# Patient Record
Sex: Female | Born: 1935 | Race: Black or African American | Hispanic: No | Marital: Married | Smoking: Never smoker
Health system: Southern US, Community
[De-identification: ages and names within clinical notes are randomized; demographics above are authoritative.]

## PROBLEM LIST (undated history)

## (undated) DIAGNOSIS — E079 Disorder of thyroid, unspecified: Secondary | ICD-10-CM

## (undated) DIAGNOSIS — K219 Gastro-esophageal reflux disease without esophagitis: Secondary | ICD-10-CM

## (undated) DIAGNOSIS — M1990 Unspecified osteoarthritis, unspecified site: Secondary | ICD-10-CM

## (undated) DIAGNOSIS — D649 Anemia, unspecified: Secondary | ICD-10-CM

## (undated) DIAGNOSIS — H409 Unspecified glaucoma: Secondary | ICD-10-CM

## (undated) DIAGNOSIS — E1140 Type 2 diabetes mellitus with diabetic neuropathy, unspecified: Secondary | ICD-10-CM

## (undated) DIAGNOSIS — I10 Essential (primary) hypertension: Secondary | ICD-10-CM

## (undated) DIAGNOSIS — C50212 Malignant neoplasm of upper-inner quadrant of left female breast: Secondary | ICD-10-CM

## (undated) HISTORY — PX: COLONOSCOPY: SHX174

## (undated) HISTORY — DX: Disorder of thyroid, unspecified: E07.9

## (undated) HISTORY — DX: Malignant neoplasm of upper-inner quadrant of left female breast: C50.212

## (undated) HISTORY — PX: ABDOMINAL HYSTERECTOMY: SHX81

## (undated) HISTORY — PX: THYROIDECTOMY: SHX17

## (undated) HISTORY — DX: Anemia, unspecified: D64.9

## (undated) HISTORY — PX: BREAST LUMPECTOMY: SHX2

## (undated) HISTORY — PX: EYE SURGERY: SHX253

---

## 1999-10-14 ENCOUNTER — Encounter: Payer: Self-pay | Admitting: Family Medicine

## 1999-10-14 ENCOUNTER — Encounter: Admission: RE | Admit: 1999-10-14 | Discharge: 1999-10-14 | Payer: Self-pay | Admitting: Family Medicine

## 2000-10-26 ENCOUNTER — Encounter: Admission: RE | Admit: 2000-10-26 | Discharge: 2000-10-26 | Payer: Self-pay | Admitting: Family Medicine

## 2000-10-26 ENCOUNTER — Encounter: Payer: Self-pay | Admitting: Family Medicine

## 2000-11-16 ENCOUNTER — Encounter (INDEPENDENT_AMBULATORY_CARE_PROVIDER_SITE_OTHER): Payer: Self-pay | Admitting: Specialist

## 2000-11-16 ENCOUNTER — Ambulatory Visit (HOSPITAL_COMMUNITY): Admission: RE | Admit: 2000-11-16 | Discharge: 2000-11-16 | Payer: Self-pay | Admitting: *Deleted

## 2001-11-01 ENCOUNTER — Encounter: Admission: RE | Admit: 2001-11-01 | Discharge: 2001-11-01 | Payer: Self-pay | Admitting: Family Medicine

## 2001-11-01 ENCOUNTER — Encounter: Payer: Self-pay | Admitting: Family Medicine

## 2002-11-13 ENCOUNTER — Encounter: Payer: Self-pay | Admitting: Family Medicine

## 2002-11-13 ENCOUNTER — Encounter: Admission: RE | Admit: 2002-11-13 | Discharge: 2002-11-13 | Payer: Self-pay | Admitting: Family Medicine

## 2002-11-20 ENCOUNTER — Encounter: Admission: RE | Admit: 2002-11-20 | Discharge: 2002-11-20 | Payer: Self-pay | Admitting: Family Medicine

## 2002-11-20 ENCOUNTER — Encounter: Payer: Self-pay | Admitting: Family Medicine

## 2003-12-06 ENCOUNTER — Encounter: Admission: RE | Admit: 2003-12-06 | Discharge: 2003-12-06 | Payer: Self-pay | Admitting: Family Medicine

## 2004-09-03 ENCOUNTER — Encounter: Admission: RE | Admit: 2004-09-03 | Discharge: 2004-09-03 | Payer: Self-pay | Admitting: Family Medicine

## 2004-12-15 ENCOUNTER — Encounter: Admission: RE | Admit: 2004-12-15 | Discharge: 2004-12-15 | Payer: Self-pay | Admitting: Family Medicine

## 2005-07-20 ENCOUNTER — Encounter: Admission: RE | Admit: 2005-07-20 | Discharge: 2005-07-20 | Payer: Self-pay | Admitting: Family Medicine

## 2005-12-02 ENCOUNTER — Emergency Department (HOSPITAL_COMMUNITY): Admission: EM | Admit: 2005-12-02 | Discharge: 2005-12-02 | Payer: Self-pay | Admitting: Emergency Medicine

## 2006-01-18 ENCOUNTER — Encounter: Admission: RE | Admit: 2006-01-18 | Discharge: 2006-01-18 | Payer: Self-pay | Admitting: Family Medicine

## 2007-01-24 ENCOUNTER — Encounter: Admission: RE | Admit: 2007-01-24 | Discharge: 2007-01-24 | Payer: Self-pay | Admitting: Family Medicine

## 2008-02-29 ENCOUNTER — Encounter: Admission: RE | Admit: 2008-02-29 | Discharge: 2008-02-29 | Payer: Self-pay | Admitting: Family Medicine

## 2008-12-16 ENCOUNTER — Encounter: Admission: RE | Admit: 2008-12-16 | Discharge: 2008-12-16 | Payer: Self-pay | Admitting: Family Medicine

## 2009-04-08 ENCOUNTER — Encounter: Admission: RE | Admit: 2009-04-08 | Discharge: 2009-04-08 | Payer: Self-pay | Admitting: Family Medicine

## 2010-04-14 ENCOUNTER — Encounter: Admission: RE | Admit: 2010-04-14 | Discharge: 2010-04-14 | Payer: Self-pay | Admitting: Family Medicine

## 2010-08-27 ENCOUNTER — Emergency Department (HOSPITAL_BASED_OUTPATIENT_CLINIC_OR_DEPARTMENT_OTHER): Admission: EM | Admit: 2010-08-27 | Discharge: 2010-08-27 | Payer: Self-pay | Admitting: Emergency Medicine

## 2010-08-27 ENCOUNTER — Ambulatory Visit: Payer: Self-pay | Admitting: Radiology

## 2011-05-05 ENCOUNTER — Other Ambulatory Visit: Payer: Self-pay | Admitting: Family Medicine

## 2011-05-05 DIAGNOSIS — Z1231 Encounter for screening mammogram for malignant neoplasm of breast: Secondary | ICD-10-CM

## 2011-05-25 ENCOUNTER — Ambulatory Visit
Admission: RE | Admit: 2011-05-25 | Discharge: 2011-05-25 | Disposition: A | Discharge status: Home / Self Care | Payer: Medicare Other | Source: Ambulatory Visit | Attending: Family Medicine | Admitting: Family Medicine

## 2011-05-25 DIAGNOSIS — Z1231 Encounter for screening mammogram for malignant neoplasm of breast: Secondary | ICD-10-CM

## 2012-05-10 ENCOUNTER — Other Ambulatory Visit: Payer: Self-pay | Admitting: Family Medicine

## 2012-05-10 DIAGNOSIS — Z1231 Encounter for screening mammogram for malignant neoplasm of breast: Secondary | ICD-10-CM

## 2012-05-26 ENCOUNTER — Ambulatory Visit
Admission: RE | Admit: 2012-05-26 | Discharge: 2012-05-26 | Disposition: A | Discharge status: Home / Self Care | Payer: Medicare Other | Source: Ambulatory Visit | Attending: Family Medicine | Admitting: Family Medicine

## 2012-05-26 DIAGNOSIS — Z1231 Encounter for screening mammogram for malignant neoplasm of breast: Secondary | ICD-10-CM

## 2012-06-26 ENCOUNTER — Encounter (HOSPITAL_BASED_OUTPATIENT_CLINIC_OR_DEPARTMENT_OTHER): Payer: Self-pay | Admitting: *Deleted

## 2012-06-26 ENCOUNTER — Emergency Department (HOSPITAL_BASED_OUTPATIENT_CLINIC_OR_DEPARTMENT_OTHER)
Admission: EM | Admit: 2012-06-26 | Discharge: 2012-06-26 | Disposition: A | Discharge status: Home / Self Care | Payer: Medicare HMO | Attending: Emergency Medicine | Admitting: Emergency Medicine

## 2012-06-26 ENCOUNTER — Emergency Department (HOSPITAL_BASED_OUTPATIENT_CLINIC_OR_DEPARTMENT_OTHER): Payer: Medicare HMO

## 2012-06-26 DIAGNOSIS — R079 Chest pain, unspecified: Secondary | ICD-10-CM

## 2012-06-26 DIAGNOSIS — E119 Type 2 diabetes mellitus without complications: Secondary | ICD-10-CM | POA: Insufficient documentation

## 2012-06-26 DIAGNOSIS — I10 Essential (primary) hypertension: Secondary | ICD-10-CM | POA: Insufficient documentation

## 2012-06-26 HISTORY — DX: Essential (primary) hypertension: I10

## 2012-06-26 LAB — COMPREHENSIVE METABOLIC PANEL
ALT: 13 U/L (ref 0–35)
AST: 22 U/L (ref 0–37)
Albumin: 3.8 g/dL (ref 3.5–5.2)
Alkaline Phosphatase: 83 U/L (ref 39–117)
BUN: 23 mg/dL (ref 6–23)
CO2: 29 mEq/L (ref 19–32)
Calcium: 10 mg/dL (ref 8.4–10.5)
Chloride: 97 mEq/L (ref 96–112)
GFR calc Af Amer: 60 mL/min — ABNORMAL LOW (ref >90)
GFR calc non Af Amer: 52 mL/min — ABNORMAL LOW (ref >90)
Glucose, Bld: 165 mg/dL — ABNORMAL HIGH (ref 70–99)
Potassium: 4.9 mEq/L (ref 3.5–5.1)
Sodium: 136 mEq/L (ref 135–145)
Total Protein: 7.6 g/dL (ref 6.0–8.3)

## 2012-06-26 LAB — CBC WITH DIFFERENTIAL/PLATELET
Basophils Absolute: 0.1 10*3/uL (ref 0.0–0.1)
Eosinophils Absolute: 0.1 10*3/uL (ref 0.0–0.7)
HCT: 34.5 % — ABNORMAL LOW (ref 36.0–46.0)
Hemoglobin: 11.3 g/dL — ABNORMAL LOW (ref 12.0–15.0)
Lymphocytes Relative: 31 % (ref 12–46)
Lymphs Abs: 1.7 10*3/uL (ref 0.7–4.0)
MCH: 29.1 pg (ref 26.0–34.0)
MCHC: 32.8 g/dL (ref 30.0–36.0)
MCV: 88.9 fL (ref 78.0–100.0)
Monocytes Absolute: 0.4 10*3/uL (ref 0.1–1.0)
Neutro Abs: 3.2 10*3/uL (ref 1.7–7.7)
Neutrophils Relative %: 59 % (ref 43–77)
Platelets: 279 10*3/uL (ref 150–400)
RBC: 3.88 MIL/uL (ref 3.87–5.11)
RDW: 12.9 % (ref 11.5–15.5)
WBC: 5.5 10*3/uL (ref 4.0–10.5)

## 2012-06-26 LAB — LIPASE, BLOOD: Lipase: 63 U/L — ABNORMAL HIGH (ref 11–59)

## 2012-06-26 MED ORDER — ASPIRIN 81 MG PO CHEW
324.0000 mg | CHEWABLE_TABLET | Freq: Once | ORAL | Status: AC
  Administered 2012-06-26: 324 mg via ORAL
  Filled 2012-06-26: qty 4

## 2012-06-26 MED ORDER — GI COCKTAIL ~~LOC~~
30.0000 mL | Freq: Once | ORAL | Status: AC
  Administered 2012-06-26: 30 mL via ORAL
  Filled 2012-06-26: qty 30

## 2012-06-26 NOTE — ED Notes (Signed)
Returned from xray

## 2012-06-26 NOTE — ED Notes (Signed)
MD at bedside. 

## 2012-06-26 NOTE — ED Notes (Addendum)
Transported to xray 

## 2012-06-26 NOTE — ED Notes (Signed)
Pt. States that she had a snack last pm around 2100. States she started having a burning type pain that lasted a half hour. States she was having "burping" Also states "funny sensation" in her ant chest/neck area. Denies any sob or vomiting. Did have a brief episode of nausea. Denies fevers. resp even and unlabored.

## 2012-06-26 NOTE — ED Provider Notes (Signed)
History     CSN: [id number]  Arrival date & time 06/26/12  0121   First MD Initiated Contact with Patient 06/26/12 0202      Chief Complaint  Patient presents with  . Chest Pain    (Consider location/radiation/quality/duration/timing/severity/associated sxs/prior treatment) Patient is a 78 y.o. female presenting with chest pain. The history is provided by the patient.  Chest Pain   She states that she had a snack at about 2130, and developed a burning pain in her chest following that. Pain was moderately severe and she rated it an 8/10. Pain was present for about 20 minutes before resolving. She did not take any medication, but did notice that it felt better when she was walking around and when she was sitting still. There is no associated dyspnea, nausea, diaphoresis. She fell asleep and then was awakened at about 00 30 by a sharp chest pain which will last only a few minutes before resolving. Since then, she describes a very vague sensation in her neck and throat which he is completely unable to describe. She had taken a dose of Pepto-Bismol with no relief. She's never had symptoms like this before. She is treated for diabetes and hypertension but is a nonsmoker.  Past Medical History  Diagnosis Date  . Hypertension   . Diabetes mellitus     Past Surgical History  Procedure Date  . Abdominal hysterectomy   . Thyroidectomy     No family history on file.  History  Substance Use Topics  . Smoking status: Never Smoker   . Smokeless tobacco: Not on file  . Alcohol Use: No    OB History    Grav Para Term Preterm Abortions TAB SAB Ect Mult Living                  Review of Systems  Cardiovascular: Positive for chest pain.  All other systems reviewed and are negative.    Allergies  Review of patient's allergies indicates no known allergies.  Home Medications   Current Outpatient Rx  Name Route Sig Dispense Refill  . AMLODIPINE BESYLATE 2.5 MG PO TABS Oral Take  2.5 mg by mouth daily.    Marland Kitchen BRIMONIDINE TARTRATE 0.15 % OP SOLN Both Eyes Place 1 drop into both eyes 2 (two) times daily.    . GLYBURIDE 5 MG PO TABS Oral Take 5 mg by mouth daily with breakfast.    . LEVOTHYROXINE SODIUM 75 MCG PO TABS Oral Take 75 mcg by mouth daily.    Marland Kitchen METFORMIN HCL 500 MG PO TABS Oral Take 1,000 mg by mouth 2 (two) times daily with a meal.      BP 148/70  Pulse 92  Temp 98.6 F (37 C) (Oral)  Resp 15  SpO2 96%  Physical Exam  Nursing note and vitals reviewed. 78year old female, resting comfortably and in no acute distress. Vital signs are significant for mild hypertension with blood pressure 148/70. Oxygen saturation is 96%, which is normal. Head is normocephalic and atraumatic. PERRLA, EOMI. Oropharynx is clear. Neck is nontender and supple without adenopathy or JVD. Back is nontender and there is no CVA tenderness. Lungs are clear without rales, wheezes, or rhonchi. Chest is nontender. Heart has regular rate and rhythm without murmur. Abdomen is soft, flat, nontender without masses or hepatosplenomegaly and peristalsis is normoactive. Extremities have no cyanosis or edema, full range of motion is present. Skin is warm and dry without rash. Neurologic: Mental status is normal, cranial nerves  are intact, there are no motor or sensory deficits.   ED Course  Procedures (including critical care time)  Results for orders placed during the hospital encounter of 06/26/12  CBC WITH DIFFERENTIAL      Component Value Range   WBC 5.5  4.0 - 10.5 K/uL   RBC 3.88  3.87 - 5.11 MIL/uL   Hemoglobin 11.3 (*) 12.0 - 15.0 g/dL   HCT 96.0 (*) 45.4 - 09.8 %   MCV 88.9  78.0 - 100.0 fL   MCH 29.1  26.0 - 34.0 pg   MCHC 32.8  30.0 - 36.0 g/dL   RDW 11.9  14.7 - 82.9 %   Platelets 279  150 - 400 K/uL   Neutrophils Relative 59  43 - 77 %   Neutro Abs 3.2  1.7 - 7.7 K/uL   Lymphocytes Relative 31  12 - 46 %   Lymphs Abs 1.7  0.7 - 4.0 K/uL   Monocytes Relative 7  3 - 12  %   Monocytes Absolute 0.4  0.1 - 1.0 K/uL   Eosinophils Relative 2  0 - 5 %   Eosinophils Absolute 0.1  0.0 - 0.7 K/uL   Basophils Relative 2 (*) 0 - 1 %   Basophils Absolute 0.1  0.0 - 0.1 K/uL  COMPREHENSIVE METABOLIC PANEL      Component Value Range   Sodium 136  135 - 145 mEq/L   Potassium 4.9  3.5 - 5.1 mEq/L   Chloride 97  96 - 112 mEq/L   CO2 29  19 - 32 mEq/L   Glucose, Bld 165 (*) 70 - 99 mg/dL   BUN 23  6 - 23 mg/dL   Creatinine, Ser 5.62  0.50 - 1.10 mg/dL   Calcium 13.0  8.4 - 86.5 mg/dL   Total Protein 7.6  6.0 - 8.3 g/dL   Albumin 3.8  3.5 - 5.2 g/dL   AST 22  0 - 37 U/L   ALT 13  0 - 35 U/L   Alkaline Phosphatase 83  39 - 117 U/L   Total Bilirubin 0.2 (*) 0.3 - 1.2 mg/dL   GFR calc non Af Amer 52 (*) >90 mL/min   GFR calc Af Amer 60 (*) >90 mL/min  LIPASE, BLOOD      Component Value Range   Lipase 63 (*) 11 - 59 U/L  TROPONIN I      Component Value Range   Troponin I <0.30  <0.30 ng/mL  TROPONIN I      Component Value Range   Troponin I <0.30  <0.30 ng/mL   Dg Chest 2 View  06/26/2012  *RADIOLOGY REPORT*  Clinical Data: Chest pain.  CHEST - 2 VIEW  Comparison: Chest CT 09/03/2004.  Findings:  Cardiopericardial silhouette within normal limits. Mediastinal contours normal. Trachea midline.  No airspace disease or effusion. Aortic arch atherosclerosis.  There is a faint density in the medial right apex overlying the medial aspect of the clavicle.  IMPRESSION: Vague density at the right apex could represent a small focus of bronchopneumonia.  Follow-up to ensure clearing is recommended. Follow-up to ensure radiographic clearing recommended.  Clearing is usually observed at 8 weeks.  If clearing is not observed, follow- up noncontrast chest CT recommended.   Original Report Authenticated By: Andreas Newport, M.D.      Date: 06/26/2012  Rate: 88  Rhythm: normal sinus rhythm  QRS Axis: normal  Intervals: normal  ST/T Wave abnormalities: normal  Conduction  Disutrbances:right  bundle branch block  Narrative Interpretation: Right bundle branch block. When compared with ECG of 12/02/2005, no significant changes are seen.  Old EKG Reviewed: unchanged  1. Chest pain       MDM  Chest discomfort of uncertain cause. She'll be given aspirin and GI cocktail and cardiac markers checked as well as metabolic panel and lipase.  She got complete relief of symptoms following GI cocktail. Initial laboratory workup is significant for borderline elevated lipase of uncertain significance. She'll be kept in the ED 2 check a three-hour delta troponin.  Repeat troponin is undetectable. She'll be discharged with instructions to followup with her PCP.    Dione Booze, MD 06/26/12 [phone]

## 2013-07-17 ENCOUNTER — Other Ambulatory Visit: Payer: Self-pay

## 2013-07-17 DIAGNOSIS — Z1231 Encounter for screening mammogram for malignant neoplasm of breast: Secondary | ICD-10-CM

## 2013-08-07 ENCOUNTER — Ambulatory Visit: Payer: Medicare Other

## 2013-08-28 ENCOUNTER — Ambulatory Visit
Admission: RE | Admit: 2013-08-28 | Discharge: 2013-08-28 | Disposition: A | Discharge status: Home / Self Care | Payer: Medicare HMO | Source: Ambulatory Visit

## 2013-08-28 DIAGNOSIS — Z1231 Encounter for screening mammogram for malignant neoplasm of breast: Secondary | ICD-10-CM

## 2014-09-19 ENCOUNTER — Other Ambulatory Visit: Payer: Self-pay

## 2014-09-19 DIAGNOSIS — Z1231 Encounter for screening mammogram for malignant neoplasm of breast: Secondary | ICD-10-CM

## 2014-10-03 ENCOUNTER — Ambulatory Visit
Admission: RE | Admit: 2014-10-03 | Discharge: 2014-10-03 | Disposition: A | Discharge status: Home / Self Care | Payer: Medicare HMO | Source: Ambulatory Visit

## 2014-10-03 DIAGNOSIS — Z1231 Encounter for screening mammogram for malignant neoplasm of breast: Secondary | ICD-10-CM

## 2014-10-08 ENCOUNTER — Other Ambulatory Visit: Payer: Self-pay | Admitting: Family Medicine

## 2014-10-08 DIAGNOSIS — R928 Other abnormal and inconclusive findings on diagnostic imaging of breast: Secondary | ICD-10-CM

## 2014-10-22 ENCOUNTER — Other Ambulatory Visit: Payer: Self-pay | Admitting: Family Medicine

## 2014-10-22 ENCOUNTER — Ambulatory Visit
Admission: RE | Admit: 2014-10-22 | Discharge: 2014-10-22 | Disposition: A | Discharge status: Home / Self Care | Payer: Medicare HMO | Source: Ambulatory Visit | Attending: Family Medicine | Admitting: Family Medicine

## 2014-10-22 DIAGNOSIS — R928 Other abnormal and inconclusive findings on diagnostic imaging of breast: Secondary | ICD-10-CM

## 2014-11-01 ENCOUNTER — Ambulatory Visit
Admission: RE | Admit: 2014-11-01 | Discharge: 2014-11-01 | Disposition: A | Discharge status: Home / Self Care | Payer: Medicare HMO | Source: Ambulatory Visit | Attending: Family Medicine | Admitting: Family Medicine

## 2014-11-01 DIAGNOSIS — R928 Other abnormal and inconclusive findings on diagnostic imaging of breast: Secondary | ICD-10-CM

## 2014-11-05 ENCOUNTER — Other Ambulatory Visit: Payer: Self-pay | Admitting: Family Medicine

## 2014-11-05 DIAGNOSIS — C50911 Malignant neoplasm of unspecified site of right female breast: Secondary | ICD-10-CM

## 2014-11-06 ENCOUNTER — Telehealth: Payer: Self-pay | Admitting: *Deleted

## 2014-11-06 ENCOUNTER — Encounter: Payer: Self-pay | Admitting: *Deleted

## 2014-11-06 DIAGNOSIS — C50212 Malignant neoplasm of upper-inner quadrant of left female breast: Secondary | ICD-10-CM

## 2014-11-06 DIAGNOSIS — C50211 Malignant neoplasm of upper-inner quadrant of right female breast: Secondary | ICD-10-CM | POA: Insufficient documentation

## 2014-11-06 HISTORY — DX: Malignant neoplasm of upper-inner quadrant of left female breast: C50.212

## 2014-11-06 NOTE — Telephone Encounter (Signed)
Confirmed [facility] for 11/13/14 at 0830.  Instructions and contact information given. Gave information to pt daughter as well.

## 2014-11-11 ENCOUNTER — Other Ambulatory Visit: Payer: Medicare HMO

## 2014-11-13 ENCOUNTER — Ambulatory Visit
Admission: RE | Admit: 2014-11-13 | Discharge: 2014-11-13 | Disposition: A | Discharge status: Home / Self Care | Payer: Medicare HMO | Source: Ambulatory Visit | Attending: Radiation Oncology | Admitting: Radiation Oncology

## 2014-11-13 ENCOUNTER — Ambulatory Visit (HOSPITAL_BASED_OUTPATIENT_CLINIC_OR_DEPARTMENT_OTHER): Payer: Medicare HMO | Admitting: Hematology and Oncology

## 2014-11-13 ENCOUNTER — Other Ambulatory Visit (HOSPITAL_BASED_OUTPATIENT_CLINIC_OR_DEPARTMENT_OTHER): Payer: Medicare HMO

## 2014-11-13 ENCOUNTER — Other Ambulatory Visit: Payer: Self-pay | Admitting: *Deleted

## 2014-11-13 ENCOUNTER — Other Ambulatory Visit (INDEPENDENT_AMBULATORY_CARE_PROVIDER_SITE_OTHER): Payer: Self-pay | Admitting: General Surgery

## 2014-11-13 ENCOUNTER — Encounter: Payer: Self-pay | Admitting: Hematology and Oncology

## 2014-11-13 ENCOUNTER — Ambulatory Visit: Payer: Medicare HMO

## 2014-11-13 ENCOUNTER — Encounter: Payer: Self-pay | Admitting: Skilled Nursing Facility1

## 2014-11-13 ENCOUNTER — Ambulatory Visit: Payer: Medicare HMO | Attending: General Surgery | Admitting: Physical Therapy

## 2014-11-13 ENCOUNTER — Encounter: Payer: Self-pay | Admitting: *Deleted

## 2014-11-13 VITALS — BP 138/54 | HR 78 | Temp 98.1°F | Resp 18 | Ht 62.0 in | Wt 158.5 lb

## 2014-11-13 DIAGNOSIS — C50212 Malignant neoplasm of upper-inner quadrant of left female breast: Secondary | ICD-10-CM

## 2014-11-13 DIAGNOSIS — R293 Abnormal posture: Secondary | ICD-10-CM | POA: Insufficient documentation

## 2014-11-13 DIAGNOSIS — C773 Secondary and unspecified malignant neoplasm of axilla and upper limb lymph nodes: Secondary | ICD-10-CM

## 2014-11-13 DIAGNOSIS — C50311 Malignant neoplasm of lower-inner quadrant of right female breast: Secondary | ICD-10-CM

## 2014-11-13 DIAGNOSIS — Z171 Estrogen receptor negative status [ER-]: Secondary | ICD-10-CM

## 2014-11-13 LAB — CBC WITH DIFFERENTIAL/PLATELET
BASO%: 1.3 % (ref 0.0–2.0)
Basophils Absolute: 0.1 10*3/uL (ref 0.0–0.1)
EOS ABS: 0.1 10*3/uL (ref 0.0–0.5)
EOS%: 1.7 % (ref 0.0–7.0)
HEMATOCRIT: 35 % (ref 34.8–46.6)
HGB: 11 g/dL — ABNORMAL LOW (ref 11.6–15.9)
LYMPH#: 1.8 10*3/uL (ref 0.9–3.3)
LYMPH%: 39.6 % (ref 14.0–49.7)
MCH: 28.4 pg (ref 25.1–34.0)
MCHC: 31.6 g/dL (ref 31.5–36.0)
MCV: 90 fL (ref 79.5–101.0)
MONO#: 0.5 10*3/uL (ref 0.1–0.9)
MONO%: 10.4 % (ref 0.0–14.0)
NEUT#: 2.2 10*3/uL (ref 1.5–6.5)
NEUT%: 47 % (ref 38.4–76.8)
Platelets: 299 10*3/uL (ref 145–400)
RBC: 3.89 10*6/uL (ref 3.70–5.45)
RDW: 13.7 % (ref 11.2–14.5)
WBC: 4.6 10*3/uL (ref 3.9–10.3)

## 2014-11-13 LAB — COMPREHENSIVE METABOLIC PANEL (CC13)
ALBUMIN: 3.7 g/dL (ref 3.5–5.0)
ALT: 13 U/L (ref 0–55)
ANION GAP: 8 meq/L (ref 3–11)
AST: 20 U/L (ref 5–34)
Alkaline Phosphatase: 88 U/L (ref 40–150)
BILIRUBIN TOTAL: 0.29 mg/dL (ref 0.20–1.20)
BUN: 17.8 mg/dL (ref 7.0–26.0)
CO2: 28 meq/L (ref 22–29)
Calcium: 9.8 mg/dL (ref 8.4–10.4)
Chloride: 104 mEq/L (ref 98–109)
Creatinine: 1.2 mg/dL — ABNORMAL HIGH (ref 0.6–1.1)
EGFR: 50 mL/min/{1.73_m2} — ABNORMAL LOW (ref >90)
GLUCOSE: 193 mg/dL — AB (ref 70–140)
POTASSIUM: 5.4 meq/L — AB (ref 3.5–5.1)
SODIUM: 140 meq/L (ref 136–145)
TOTAL PROTEIN: 7.5 g/dL (ref 6.4–8.3)

## 2014-11-13 MED ORDER — ONDANSETRON HCL 8 MG PO TABS: 8.0000 mg | ORAL_TABLET | Freq: Two times a day (BID) | ORAL | Status: DC

## 2014-11-13 MED ORDER — PROCHLORPERAZINE MALEATE 10 MG PO TABS: 10.0000 mg | ORAL_TABLET | Freq: Four times a day (QID) | ORAL | Status: DC | PRN

## 2014-11-13 MED ORDER — LORAZEPAM 0.5 MG PO TABS: 0.5000 mg | ORAL_TABLET | Freq: Four times a day (QID) | ORAL | Status: DC | PRN

## 2014-11-13 NOTE — Progress Notes (Signed)
Subjective:     Patient ID: Brianna Valdez, female   DOB: Jul 01, 1931, 81 y.o.   MRN: [id number]  HPI   Review of Systems     Objective:   Physical Exam    For the patient to understand and be given the tools to implement a healthy plant based diet during their cancer diagnosis.  Assessment:     Patient was seen today and found to be in good spirits and was accompanied by two daughters and her husband. Patient has a palpable right breast mass. Patient complains of a decreased appetite since about a year ago. Patient states she does not wake up until about 11am and does not get a restful sleep. Patients current medications are synthroid (thyroidectomy many years ago), lisinopril, and a multivitamin. She has diabetes but she states it is well managed. Patient states she will try to increase her physical activity level by getting on the stationary bike for thirty minutes a day (as suggested by the PT). Patients weight taken 11/13/14 is 158 at a height of 5'2'' and a BMI of 29.1 Patients labs: Potassium high at 5.4, Creatinine high at 1.2, GFR low at 50 (no kidney failure diagnosis found in E-MR), and HGB is low at 11.    Plan:     Dietitian educated the patient on implementing a plant based diet by incorporating more plant proteins, fruits, and vegetables. As a part of a healthy routine physical activity was discussed.  Dietitian encouraged the patient to increase her caloric intake and assisted the patient with some ideas on how to do that, i.e. A smoothie in the morning and 2-3 snacks throughout the day.    The importance of legitimate, evidence based nutrition information was discussed and examples were given.  A folder of evidenced based information with a focus on a plant based diet and nutrition during cancer was given to the patient.  As a part of the continuum of care the cancer dietitians contact information was given  in the event the patient would like to have a follow up  appointment.

## 2014-11-13 NOTE — Progress Notes (Signed)
[facility] NOTE  Patient Care Team: Hulan Fess, MD as PCP - General Excell Seltzer, MD as Consulting Physician (General Surgery) Rulon Eisenmenger, MD as Consulting Physician (Hematology and Oncology) Thea Silversmith, MD as Consulting Physician (Radiation Oncology) Roselee Culver, RN as Registered Nurse [facility] Caleen Jobs, RN as Registered Nurse  CHIEF COMPLAINTS/PURPOSE OF CONSULTATION:  Newly diagnosed breast cancer  HISTORY OF PRESENTING ILLNESS:  Brianna Valdez 81 y.o. female is here because of recent diagnosis of right-sided breast cancer. She had a screening mammogram that detected a right breast mass which was ill-defined, this led to an ultrasound that revealed 2.6 and a mass in the right breast in addition there were multiple right axillary lymph nodes the largest of which was 5 mm with cortical thickening. She underwent a biopsy of the mass as well as lymph node which came back as grade 3 invasive ductal carcinoma that was triple negative with a Ki-67 of 43% in the lymph node and 39% in the breast tumor. She is going to have an MRI of the breast on 11/15/2014. She was presented this morning at the multidisciplinary tumor board and she is here today to discuss a treatment plan in the [facility] clinic. She is accompanied by her family and her husband today. She denies any pain in the breast but now has a palpable mass in the right breast.  I reviewed her records extensively and collaborated the history with the patient.  SUMMARY OF ONCOLOGIC HISTORY:   Breast cancer of upper-inner quadrant of left female breast   10/22/2014 Mammogram Highly suspicious 2.6 cm mass in the upper inner quadrant of the right breast at the 2 o'clock , Pathologic right axillary lymph nodes, including a node in the mid axillary line with eccentric cortical thickening up to 5 mm   11/01/2014 Initial Biopsy Right breast biopsy 2:00: IDC with DCIS, ER/PR 0%, Ki-67 39%, HER-2  negative; right axillary lymph node biopsy: Positive for IDC ER/PR 0%, 43%, HER-2 Negative Ratio 0.94    In terms of breast cancer risk profile:  Menarche at menopause unsure She had 4 pregnancy, her first child was born at age 22  She has not received birth control pills.  She was never exposed to fertility medications or hormone replacement therapy.  She has no family history of Breast/GYN/GI cancer  MEDICAL HISTORY:  Past Medical History  Diagnosis Date  . Hypertension   . Diabetes mellitus   . Breast cancer of upper-inner quadrant of left female breast 11/06/2014  . Anemia   . Thyroid disease     SURGICAL HISTORY: Past Surgical History  Procedure Laterality Date  . Abdominal hysterectomy    . Thyroidectomy      SOCIAL HISTORY: History   Social History  . Marital Status: Married    Spouse Name: N/A  . Number of Children: N/A  . Years of Education: N/A   Occupational History  . Not on file.   Social History Main Topics  . Smoking status: Never Smoker   . Smokeless tobacco: Not on file  . Alcohol Use: No  . Drug Use: No  . Sexual Activity: Not on file   Other Topics Concern  . Not on file   Social History Narrative    FAMILY HISTORY: History reviewed. No pertinent family history.  ALLERGIES:  has No Known Allergies.  MEDICATIONS:  Current Outpatient Prescriptions  Medication Sig Dispense Refill  . amLODipine (NORVASC) 2.5 MG tablet Take 2.5 mg by mouth  daily.    . levothyroxine (SYNTHROID, LEVOTHROID) 75 MCG tablet Take 75 mcg by mouth daily.    Marland Kitchen lisinopril (PRINIVIL,ZESTRIL) 10 MG tablet Take 10 mg by mouth daily.    . metFORMIN (GLUCOPHAGE) 500 MG tablet Take 1,000 mg by mouth 2 (two) times daily with a meal.    . Multiple Vitamin (MULTIVITAMIN) tablet Take 1 tablet by mouth daily.    . brimonidine (ALPHAGAN) 0.15 % ophthalmic solution Place 1 drop into both eyes 2 (two) times daily.    Marland Kitchen glimepiride (AMARYL) 1 MG tablet   6  . glyBURIDE (DIABETA)  5 MG tablet Take 5 mg by mouth daily with breakfast.     No current facility-administered medications for this visit.    REVIEW OF SYSTEMS:   Constitutional: Denies fevers, chills or abnormal night sweats Eyes: Denies blurriness of vision, double vision or watery eyes Ears, nose, mouth, throat, and face: Denies mucositis or sore throat Respiratory: Denies cough, dyspnea or wheezes Cardiovascular: Denies palpitation, chest discomfort or lower extremity swelling Gastrointestinal:  Denies nausea, heartburn or change in bowel habits Skin: Denies abnormal skin rashes Lymphatics: Denies new lymphadenopathy or easy bruising Neurological:Denies numbness, tingling or new weaknesses Behavioral/Psych: Mood is stable, no new changes  Breast: Palpable mass in the right breast All other systems were reviewed with the patient and are negative.  PHYSICAL EXAMINATION: ECOG PERFORMANCE STATUS: 0 - Asymptomatic  Filed Vitals:   11/13/14 0857  BP: 138/54  Pulse: 78  Temp: 98.1 F (36.7 C)  Resp: 18   Filed Weights   11/13/14 0857  Weight: 158 lb 8 oz (71.895 kg)    GENERAL:alert, no distress and comfortable SKIN: skin color, texture, turgor are normal, no rashes or significant lesions EYES: normal, conjunctiva are pink and non-injected, sclera clear OROPHARYNX:no exudate, no erythema and lips, buccal mucosa, and tongue normal  NECK: supple, thyroid normal size, non-tender, without nodularity LYMPH:  no palpable lymphadenopathy in the cervical, axillary or inguinal LUNGS: clear to auscultation and percussion with normal breathing effort HEART: regular rate & rhythm and no murmurs and no lower extremity edema ABDOMEN:abdomen soft, non-tender and normal bowel sounds Musculoskeletal:no cyanosis of digits and no clubbing  PSYCH: alert & oriented x 3 with fluent speech NEURO: no focal motor/sensory deficits BREAST: Large palpable mass in the right breast 5 centimeters to physical examination.  Small palpable axillary lymph nodes (exam performed in the presence of a chaperone)   LABORATORY DATA:  I have reviewed the data as listed Lab Results  Component Value Date   WBC 4.6 11/13/2014   HGB 11.0* 11/13/2014   HCT 35.0 11/13/2014   MCV 90.0 11/13/2014   PLT 299 11/13/2014   Lab Results  Component Value Date   NA 140 11/13/2014   K 5.4* 11/13/2014   CL 97 06/26/2012   CO2 28 11/13/2014    RADIOGRAPHIC STUDIES: I have personally reviewed the radiological reports and agreed with the findings in the report. Results are summarized as above  ASSESSMENT AND PLAN:  Breast cancer of upper-inner quadrant of left female breast 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 ER 0%, PR 0%, HER-2 negative Ki-67 39%, 2.6 cm mass Right axillary lymph node biopsy positive for invasive ductal carcinoma ER 0% PR 0% HER-2 negative Ki-67 43% Clinical stage: T2 N1 M0 stage IIB  Pathology and radiology counseling:Discussed with the patient, the details of pathology including the type of breast cancer,the clinical staging, the significance of ER, PR and HER-2/neu receptors and  the implications for treatment. After reviewing the pathology in detail, we proceeded to discuss the different treatment options between surgery, radiation, chemotherapy, antiestrogen therapies.  Recommendation based on multidisciplinary tumor board: 1. Neoadjuvant chemotherapy with weekly Taxol and carboplatin 12 2. PET CT scan for staging purposes because she has multiple positive axillary lymph nodes 3. Following chemotherapy, surgery with consideration for ALLIANCE clinical trial. 4. Following surgery adjuvant radiation may be considered 5. Chemotherapy class  Chemotherapy counseling: I discussed risks and benefits of chemotherapy including the risk of hair loss which the patient has a week already, neuropathy, nausea vomiting, loss of taste, fatigue, decrease in blood counts, risk of infection and patient  understands this and is agreeable to proceed with treatment.  Plan to start chemotherapy in 2 weeks after port has been placed.   All questions were answered. The patient knows to call the clinic with any problems, questions or concerns.    Rulon Eisenmenger, MD 11:38 AM

## 2014-11-13 NOTE — Assessment & Plan Note (Addendum)
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 ER 0%, PR 0%, HER-2 negative Ki-67 39%, 2.6 cm mass Right axillary lymph node biopsy positive for invasive ductal carcinoma ER 0% PR 0% HER-2 negative Ki-67 43% Clinical stage: T2 N1 M0 stage IIB  Pathology and radiology counseling:Discussed with the patient, the details of pathology including the type of breast cancer,the clinical staging, the significance of ER, PR and HER-2/neu receptors and the implications for treatment. After reviewing the pathology in detail, we proceeded to discuss the different treatment options between surgery, radiation, chemotherapy, antiestrogen therapies.  Recommendation based on multidisciplinary tumor board: 1. Neoadjuvant chemotherapy with weekly Taxol and carboplatin 12 2. PET CT scan for staging purposes because she has multiple positive axillary lymph nodes 3. Following chemotherapy, surgery with consideration for ALLIANCE clinical trial. 4. Following surgery adjuvant radiation may be considered 5. Chemotherapy class  Chemotherapy counseling: I discussed risks and benefits of chemotherapy including the risk of hair loss which the patient has a week already, neuropathy, nausea vomiting, loss of taste, fatigue, decrease in blood counts, risk of infection and patient understands this and is agreeable to proceed with treatment.  Plan to start chemotherapy in 2 weeks after port has been placed.

## 2014-11-13 NOTE — Progress Notes (Signed)
  Radiation Oncology         [phone]) [phone] ________________________________  Initial outpatient Consultation - Date: 11/13/2014   Name: Brianna Valdez MRN: [id number]   DOB: 01/14/1931  REFERRING PHYSICIAN: Excell Seltzer, MD  DIAGNOSIS: T2N1 triple negative right breast cancer  HISTORY OF PRESENT ILLNESS::Brianna Valdez is a 81 y.o. female  Who was found to have a right breast mass on screening mammogram in the lower inner quadrant. This measured 1.1 x 2.6 x 1.9 cm on ultrasound. Enlarged right axillary lymph nodes were seen and biopsied. The biopsy of the lymph nodes was positive for malignancy and the breast mass biopsy showed a Grade 3 Invasive Ductal Carcinoma which was triple negative with a Ki 67 of 40%. MRI is scheduled for 2/12. She is GXP4 with her first birth at 71. She is post menopausal. She is unsure of when her menses started.   PREVIOUS RADIATION THERAPY: No  Past medical, social and family history were reviewed in the electronic chart. Review of symptoms was reviewed in the electronic chart. Medications were reviewed in the electronic chart.   PHYSICAL EXAM: There were no vitals filed for this visit.. . She is pleasant and in no distress. She has a palpable mass in the upper inner quadrant of the right breast. There is bruising lateral to this. She has no palpable adenopathy. She has no palpable abnormalities of the left breast. She is alert and oriented with intact cranial nerves 2-12.   IMPRESSION: T2N1 Invasive Ductal Carcinoma of the right breast.   PLAN: I spoke to the patient today regarding her diagnosis and options for treatment. We discussed the equivalence in terms of survival and local failure between mastectomy and breast conservation. We discussed the role of radiation in decreasing local failures in patients who undergo mastectomy and have positive lymph nodes or tumors greater than 5 cm. We discussed the role of radiation and decreasing local  failures in patients who undergo lumpectomy.We discussed the possible side effects including but not limited to asymptomatic rib, heart and lung damage, heart disease, skin redness, fatigue, permanent skin darkening, and chest wall swelling. We discussed increased complications that can occur with reconstruction after radiation. We discussed the process of simulation and the placement tattoos. We discussed the low likelihood of secondary malignancies.    She will undergo her MRI on 2/12, staging and likely neoadjuvant chemotherapy. I will see her back when her surgery is complete.    I spent 40 minutes  face to face with the patient and more than 50% of that time was spent in counseling and/or coordination of care.   ------------------------------------------------  Thea Silversmith, MD

## 2014-11-13 NOTE — Progress Notes (Signed)
Checked in new pt with no financial concerns prior to seeing the dr.  Informed pt if chemo is part of her treatment I will obtain auth for chemo if needed as well as contact foundations that offer copay assistance for chemo if needed.  She has my card for any billing questions or concerns.

## 2014-11-13 NOTE — Therapy (Signed)
[place], [place], [address] Phone: [phone]   Fax:  [phone]  Physical Therapy Evaluation  Patient Details  Name: Brianna Valdez MRN: [id number] Date of Birth: Aug 05, 1931 Referring Provider:  Excell Seltzer, MD  Encounter Date: 11/13/2014      PT End of Session - 11/13/14 1145    Visit Number 1   Number of Visits 1   PT Start Time 1022   PT Stop Time 1048   PT Time Calculation (min) 26 min   Activity Tolerance Patient tolerated treatment well   Behavior During Therapy [facility] for tasks assessed/performed      Past Medical History  Diagnosis Date  . Hypertension   . Diabetes mellitus   . Breast cancer of upper-inner quadrant of left female breast 11/06/2014  . Anemia   . Thyroid disease     Past Surgical History  Procedure Laterality Date  . Abdominal hysterectomy    . Thyroidectomy      There were no vitals taken for this visit.  Visit Diagnosis:  Carcinoma of lower-inner quadrant of right breast - Plan: PT plan of care cert/re-cert  Abnormal posture - Plan: PT plan of care cert/re-cert      Subjective Assessment - 11/13/14 1133    Symptoms Patient is being seen today for a baseline assessment of her newly diagnosed right breast cancer.   Pertinent History She was diagnosed on 11/04/14 with right triple negative, node positive 2.6 cm breast cancer.    Patient Stated Goals Reduce lymphedema risk and learn post op shoulder ROM HEP.   Currently in Pain? No/denies          [facility] PT Assessment - 11/13/14 0001    Assessment   Medical Diagnosis Right breast cancer   Onset Date 11/04/14   Precautions   Precautions Other (comment)  Active breast cancer   Balance Screen   Has the patient fallen in the past 6 months No   Has the patient had a decrease in activity level because of a fear of falling?  No   Is the patient reluctant to leave their home because of a fear of  falling?  No   Home Environment   Living Enviornment Private residence   Living Arrangements Spouse/significant other   Available Help at Discharge Family   Prior Function   Level of [place] with basic ADLs   Vocation Retired  from hospital administration   Leisure She does not exercise   Cognition   Overall Cognitive Status Within Functional Limits for tasks assessed   Posture/Postural Control   Posture/Postural Control Postural limitations   Postural Limitations Rounded Shoulders;Forward head   AROM   Right Shoulder Extension 60 Degrees   Right Shoulder Flexion 142 Degrees   Right Shoulder ABduction 172 Degrees   Right Shoulder Internal Rotation 50 Degrees   Right Shoulder External Rotation 80 Degrees   Left Shoulder Extension 60 Degrees   Left Shoulder Flexion 125 Degrees   Left Shoulder ABduction 150 Degrees   Left Shoulder Internal Rotation 64 Degrees   Left Shoulder External Rotation 80 Degrees   Strength   Overall Strength Within functional limits for tasks performed           LYMPHEDEMA/ONCOLOGY QUESTIONNAIRE - 11/13/14 1143    Type   Cancer Type right breast   Lymphedema Assessments   Lymphedema Assessments Upper extremities   Right Upper Extremity Lymphedema   10 cm Proximal to Olecranon Process 27.9 cm  Olecranon Process 23.5 cm   10 cm Proximal to Ulnar Styloid Process 21.5 cm   Just Proximal to Ulnar Styloid Process 15.3 cm   Across Hand at [employer] 19.2 cm   At [place] of 2nd Digit 6.8 cm   Left Upper Extremity Lymphedema   10 cm Proximal to Olecranon Process 30 cm   Olecranon Process 24.2 cm   10 cm Proximal to Ulnar Styloid Process 20.7 cm   Just Proximal to Ulnar Styloid Process 15.2 cm   Across Hand at [employer] 18.5 cm   At [place] of 2nd Digit 6.3 cm       Patient was instructed today in a home exercise program today for post op shoulder range of motion. These included active assist shoulder flexion in sitting,  scapular retraction, wall walking with shoulder abduction, and hands behind head external rotation.  She was encouraged to do these twice a day, holding 3 seconds and repeating 5 times when permitted by her physician.         PT Education - 11/13/14 1145    Education provided Yes   Education Details Post op shoulder ROM HEP and lymphedema risk reduction   Person(s) Educated Patient;Spouse;Child(ren)   Methods Explanation;Demonstration;Handout   Comprehension Verbalized understanding              Breast Clinic Goals - 11/13/14 1148    Patient will be able to verbalize understanding of pertinent lymphedema risk reduction practices relevant to her diagnosis specifically related to skin care.   Time 1   Period Days   Status Achieved   Patient will be able to return demonstrate and/or verbalize understanding of the post-op home exercise program related to regaining shoulder range of motion.   Time 1   Period Days   Status Achieved   Patient will be able to verbalize understanding of the importance of attending the postoperative After Breast Cancer Class for further lymphedema risk reduction education and therapeutic exercise.   Time 1   Period Days   Status Achieved              Plan - 11/13/14 1146    Clinical Impression Statement Patient was diagnosed with right breast cancer on 11/04/14.  She has a known positive right axillary lymph node.  She is planning to undergo neoadjuvant chemotherapy followed by a right lumpectomy or mastectomy with an axillary node dissection followed possibly by radiation.  She will benefit from post op P.T. to regain shoulder ROM and strength.   Pt will benefit from skilled therapeutic intervention in order to improve on the following deficits Decreased range of motion;Increased edema;Decreased knowledge of precautions;Decreased strength;Impaired UE functional use   Rehab Potential Good   Clinical Impairments Affecting Rehab Potential none   PT  Frequency One time visit   PT Treatment/Interventions Patient/family education;Therapeutic exercise   Consulted and Agree with Plan of Care Patient;Family member/caregiver   Family Member Consulted children and husband    Patient will follow up at outpatient cancer rehab if needed following surgery.  If the patient requires physical therapy at that time, a specific plan will be dictated and sent to the referring physician for approval. The patient was educated today on appropriate basic range of motion exercises to begin post operatively and the importance of attending the After Breast Cancer class following surgery.  Patient was educated today on lymphedema risk reduction practices as it pertains to recommendations that will benefit the patient immediately following surgery.  She verbalized good understanding.  No additional physical therapy is indicated at this time.         G-Codes - 24-Nov-2014 1148    Functional Assessment Tool Used Clinical Judgement   Functional Limitation Other PT primary   Other PT Primary Current Status (D6438) At least 1 percent but less than 20 percent impaired, limited or restricted   Other PT Primary Goal Status (V8184) At least 1 percent but less than 20 percent impaired, limited or restricted   Other PT Primary Discharge Status (C3754) At least 1 percent but less than 20 percent impaired, limited or restricted       Problem List Patient Active Problem List   Diagnosis Date Noted  . Breast cancer of upper-inner quadrant of left female breast 11/06/2014    Annia Friendly, PT Nov 24, 2014, 11:50 AM  [place], [place], [address] Phone: [phone]   Fax:  [phone]

## 2014-11-13 NOTE — Patient Instructions (Signed)

## 2014-11-13 NOTE — Progress Notes (Signed)
Clinical Social Work [place] Psychosocial Distress Screening [place]  Patient completed distress screening protocol and scored a 4 on the Psychosocial Distress Thermometer which indicates mild distress. Clinical Social Worker met with patient and family in [facility] to assess for distress and other psychosocial needs.  Patient stated she was doing well and felt comfortable with treatment plans after meeting with the multidisciplinary team.  CSW and patient discussed common feelings and concerns when being diagnosed with cancer.  CSW informed patient and family on the support team and support services at [facility] and encouraged patient to call with any questions or concerns.       ONCBCN DISTRESS SCREENING 11/13/2014  Screening Type Initial Screening  Distress experienced in past week (1-10) 4  Emotional problem type Adjusting to illness  Physical Problem type Sleep/insomnia;Loss of appetitie  Physician notified of physical symptoms Yes  Referral to clinical psychology No  Referral to clinical social work No  Referral to dietition No  Referral to financial advocate No  Referral to support programs No  Referral to palliative care No   Johnnye Lana, MSW, LCSW, OSW-C Clinical Social Worker [place] [phone]

## 2014-11-14 ENCOUNTER — Telehealth: Payer: Self-pay | Admitting: Hematology and Oncology

## 2014-11-14 NOTE — Telephone Encounter (Signed)
, °

## 2014-11-15 ENCOUNTER — Ambulatory Visit
Admission: RE | Admit: 2014-11-15 | Discharge: 2014-11-15 | Disposition: A | Discharge status: Home / Self Care | Payer: Medicare HMO | Source: Ambulatory Visit | Attending: Family Medicine | Admitting: Family Medicine

## 2014-11-15 DIAGNOSIS — C50911 Malignant neoplasm of unspecified site of right female breast: Secondary | ICD-10-CM

## 2014-11-15 MED ORDER — GADOBENATE DIMEGLUMINE 529 MG/ML IV SOLN: 7.0000 mL | Freq: Once | INTRAVENOUS | Status: AC | PRN

## 2014-11-18 ENCOUNTER — Other Ambulatory Visit: Payer: Medicare HMO

## 2014-11-20 ENCOUNTER — Encounter: Payer: Self-pay | Admitting: *Deleted

## 2014-11-20 ENCOUNTER — Other Ambulatory Visit: Payer: Medicare HMO

## 2014-11-22 ENCOUNTER — Other Ambulatory Visit: Payer: Self-pay

## 2014-11-25 ENCOUNTER — Encounter (HOSPITAL_COMMUNITY)
Admission: RE | Admit: 2014-11-25 | Discharge: 2014-11-25 | Disposition: A | Discharge status: Home / Self Care | Payer: Medicare HMO | Source: Ambulatory Visit | Attending: General Surgery | Admitting: General Surgery

## 2014-11-25 ENCOUNTER — Encounter (HOSPITAL_COMMUNITY): Payer: Self-pay

## 2014-11-25 ENCOUNTER — Other Ambulatory Visit (HOSPITAL_COMMUNITY): Payer: Self-pay | Admitting: *Deleted

## 2014-11-25 ENCOUNTER — Other Ambulatory Visit: Payer: Self-pay | Admitting: Oncology

## 2014-11-25 ENCOUNTER — Telehealth: Payer: Self-pay | Admitting: Hematology and Oncology

## 2014-11-25 DIAGNOSIS — K219 Gastro-esophageal reflux disease without esophagitis: Secondary | ICD-10-CM | POA: Diagnosis not present

## 2014-11-25 DIAGNOSIS — E1140 Type 2 diabetes mellitus with diabetic neuropathy, unspecified: Secondary | ICD-10-CM | POA: Diagnosis not present

## 2014-11-25 DIAGNOSIS — Z853 Personal history of malignant neoplasm of breast: Secondary | ICD-10-CM | POA: Diagnosis not present

## 2014-11-25 DIAGNOSIS — C50911 Malignant neoplasm of unspecified site of right female breast: Secondary | ICD-10-CM | POA: Diagnosis not present

## 2014-11-25 DIAGNOSIS — E890 Postprocedural hypothyroidism: Secondary | ICD-10-CM | POA: Diagnosis not present

## 2014-11-25 DIAGNOSIS — T82524A Displacement of infusion catheter, initial encounter: Secondary | ICD-10-CM | POA: Diagnosis not present

## 2014-11-25 DIAGNOSIS — Z79899 Other long term (current) drug therapy: Secondary | ICD-10-CM | POA: Diagnosis not present

## 2014-11-25 DIAGNOSIS — M1990 Unspecified osteoarthritis, unspecified site: Secondary | ICD-10-CM | POA: Diagnosis not present

## 2014-11-25 DIAGNOSIS — I10 Essential (primary) hypertension: Secondary | ICD-10-CM | POA: Diagnosis not present

## 2014-11-25 DIAGNOSIS — Z90710 Acquired absence of both cervix and uterus: Secondary | ICD-10-CM | POA: Diagnosis not present

## 2014-11-25 DIAGNOSIS — Y929 Unspecified place or not applicable: Secondary | ICD-10-CM | POA: Diagnosis not present

## 2014-11-25 DIAGNOSIS — Y838 Other surgical procedures as the cause of abnormal reaction of the patient, or of later complication, without mention of misadventure at the time of the procedure: Secondary | ICD-10-CM | POA: Diagnosis not present

## 2014-11-25 DIAGNOSIS — H409 Unspecified glaucoma: Secondary | ICD-10-CM | POA: Diagnosis not present

## 2014-11-25 DIAGNOSIS — Z171 Estrogen receptor negative status [ER-]: Secondary | ICD-10-CM | POA: Diagnosis not present

## 2014-11-25 HISTORY — DX: Type 2 diabetes mellitus with diabetic neuropathy, unspecified: E11.40

## 2014-11-25 HISTORY — DX: Gastro-esophageal reflux disease without esophagitis: K21.9

## 2014-11-25 HISTORY — DX: Unspecified glaucoma: H40.9

## 2014-11-25 HISTORY — DX: Unspecified osteoarthritis, unspecified site: M19.90

## 2014-11-25 LAB — CBC
HEMATOCRIT: 33 % — AB (ref 36.0–46.0)
HEMOGLOBIN: 10.7 g/dL — AB (ref 12.0–15.0)
MCH: 28.5 pg (ref 26.0–34.0)
MCHC: 32.4 g/dL (ref 30.0–36.0)
MCV: 87.8 fL (ref 78.0–100.0)
Platelets: 310 10*3/uL (ref 150–400)
RBC: 3.76 MIL/uL — ABNORMAL LOW (ref 3.87–5.11)
RDW: 13.4 % (ref 11.5–15.5)
WBC: 4.5 10*3/uL (ref 4.0–10.5)

## 2014-11-25 LAB — BASIC METABOLIC PANEL
Anion gap: 9 (ref 5–15)
BUN: 17 mg/dL (ref 6–23)
CALCIUM: 9.6 mg/dL (ref 8.4–10.5)
CHLORIDE: 105 mmol/L (ref 96–112)
CO2: 26 mmol/L (ref 19–32)
CREATININE: 1.02 mg/dL (ref 0.50–1.10)
GFR calc non Af Amer: 50 mL/min — ABNORMAL LOW (ref >90)
GFR, EST AFRICAN AMERICAN: 58 mL/min — AB (ref >90)
GLUCOSE: 179 mg/dL — AB (ref 70–99)
Potassium: 4.9 mmol/L (ref 3.5–5.1)
Sodium: 140 mmol/L (ref 135–145)

## 2014-11-25 MED ORDER — CEFAZOLIN SODIUM-DEXTROSE 2-3 GM-% IV SOLR
2.0000 g | INTRAVENOUS | Status: AC
  Administered 2014-11-26: 2 g via INTRAVENOUS
  Filled 2014-11-25: qty 50

## 2014-11-25 MED ORDER — CHLORHEXIDINE GLUCONATE 4 % EX LIQD
1.0000 "application " | Freq: Once | CUTANEOUS | Status: DC
  Filled 2014-11-25: qty 15

## 2014-11-25 NOTE — Telephone Encounter (Signed)
, °

## 2014-11-25 NOTE — Pre-Procedure Instructions (Signed)
Brianna Valdez  11/25/2014   Your procedure is scheduled on:  Tuesday, November 26, 2014 at 7:15 AM.   Report to [facility] Entrance "A" Admitting Office at 5:30 AM.   Call this number if you have problems the morning of surgery: [phone]   Remember:   Do not eat food or drink liquids after midnight tonight.   Take these medicines the morning of surgery with A SIP OF WATER: Amlodipine (Norvasc), Levothyroxine (Synthroid)  Do not take your diabetic medications in the morning.   Do not wear jewelry, make-up or nail polish.  Do not wear lotions, powders, or perfumes. You may wear deodorant.  Do not shave 48 hours prior to surgery.   Do not bring valuables to the hospital.  [facility] is not responsible                  for any belongings or valuables.               Contacts, dentures or bridgework may not be worn into surgery.  Leave suitcase in the car. After surgery it may be brought to your room.  For patients admitted to the hospital, discharge time is determined by your                treatment team.               Patients discharged the day of surgery will not be allowed to drive home.    Special Instructions: [place] - Preparing for Surgery  Before surgery, you can play an important role.  Because skin is not sterile, your skin needs to be as free of germs as possible.  You can reduce the number of germs on you skin by washing with CHG (chlorahexidine gluconate) soap before surgery.  CHG is an antiseptic cleaner which kills germs and bonds with the skin to continue killing germs even after washing.  Please DO NOT use if you have an allergy to CHG or antibacterial soaps.  If your skin becomes reddened/irritated stop using the CHG and inform your nurse when you arrive at Short Stay.  Do not shave (including legs and underarms) for at least 48 hours prior to the first CHG shower.  You may shave your face.  Please follow these instructions carefully:   1.   Shower with CHG Soap the night before surgery and the                                morning of Surgery.  2.  If you choose to wash your hair, wash your hair first as usual with your       normal shampoo.  3.  After you shampoo, rinse your hair and body thoroughly to remove the                      Shampoo.  4.  Use CHG as you would any other liquid soap.  You can apply chg directly       to the skin and wash gently with scrungie or a clean washcloth.  5.  Apply the CHG Soap to your body ONLY FROM THE NECK DOWN.        Do not use on open wounds or open sores.  Avoid contact with your eyes, ears, mouth and genitals (private parts).  Wash genitals (private parts) with your normal soap.  6.  Wash thoroughly, paying special attention to the area where your surgery        will be performed.  7.  Thoroughly rinse your body with warm water from the neck down.  8.  DO NOT shower/wash with your normal soap after using and rinsing off       the CHG Soap.  9.  Pat yourself dry with a clean towel.            10.  Wear clean pajamas.            11.  Place clean sheets on your bed the night of your first shower and do not        sleep with pets.  Day of Surgery  Do not apply any lotions the morning of surgery.  Please wear clean clothes to the hospital.     Please read over the following fact sheets that you were given: Pain Booklet, Coughing and Deep Breathing and Surgical Site Infection Prevention

## 2014-11-26 ENCOUNTER — Ambulatory Visit (HOSPITAL_COMMUNITY): Payer: Medicare HMO

## 2014-11-26 ENCOUNTER — Encounter (HOSPITAL_COMMUNITY): Payer: Self-pay | Admitting: *Deleted

## 2014-11-26 ENCOUNTER — Ambulatory Visit (HOSPITAL_COMMUNITY): Payer: Medicare HMO | Admitting: Anesthesiology

## 2014-11-26 ENCOUNTER — Ambulatory Visit (HOSPITAL_COMMUNITY)
Admission: RE | Admit: 2014-11-26 | Discharge: 2014-11-26 | Disposition: A | Discharge status: Home / Self Care | Payer: Medicare HMO | Source: Ambulatory Visit | Attending: General Surgery | Admitting: General Surgery

## 2014-11-26 ENCOUNTER — Other Ambulatory Visit: Payer: Self-pay

## 2014-11-26 ENCOUNTER — Ambulatory Visit (HOSPITAL_COMMUNITY): Payer: Medicare HMO | Admitting: Critical Care Medicine

## 2014-11-26 ENCOUNTER — Encounter (HOSPITAL_COMMUNITY)
Admission: RE | Disposition: A | Discharge status: Home / Self Care | Payer: Self-pay | Source: Ambulatory Visit | Attending: General Surgery

## 2014-11-26 DIAGNOSIS — M1990 Unspecified osteoarthritis, unspecified site: Secondary | ICD-10-CM | POA: Insufficient documentation

## 2014-11-26 DIAGNOSIS — T82524A Displacement of infusion catheter, initial encounter: Secondary | ICD-10-CM | POA: Diagnosis not present

## 2014-11-26 DIAGNOSIS — E1140 Type 2 diabetes mellitus with diabetic neuropathy, unspecified: Secondary | ICD-10-CM | POA: Insufficient documentation

## 2014-11-26 DIAGNOSIS — H409 Unspecified glaucoma: Secondary | ICD-10-CM | POA: Insufficient documentation

## 2014-11-26 DIAGNOSIS — E890 Postprocedural hypothyroidism: Secondary | ICD-10-CM | POA: Insufficient documentation

## 2014-11-26 DIAGNOSIS — I10 Essential (primary) hypertension: Secondary | ICD-10-CM | POA: Diagnosis not present

## 2014-11-26 DIAGNOSIS — C50911 Malignant neoplasm of unspecified site of right female breast: Secondary | ICD-10-CM | POA: Insufficient documentation

## 2014-11-26 DIAGNOSIS — K219 Gastro-esophageal reflux disease without esophagitis: Secondary | ICD-10-CM | POA: Diagnosis not present

## 2014-11-26 DIAGNOSIS — Z171 Estrogen receptor negative status [ER-]: Secondary | ICD-10-CM | POA: Insufficient documentation

## 2014-11-26 DIAGNOSIS — Z09 Encounter for follow-up examination after completed treatment for conditions other than malignant neoplasm: Secondary | ICD-10-CM

## 2014-11-26 DIAGNOSIS — C50919 Malignant neoplasm of unspecified site of unspecified female breast: Secondary | ICD-10-CM

## 2014-11-26 DIAGNOSIS — Z79899 Other long term (current) drug therapy: Secondary | ICD-10-CM | POA: Insufficient documentation

## 2014-11-26 DIAGNOSIS — Z90710 Acquired absence of both cervix and uterus: Secondary | ICD-10-CM | POA: Insufficient documentation

## 2014-11-26 DIAGNOSIS — Y929 Unspecified place or not applicable: Secondary | ICD-10-CM | POA: Insufficient documentation

## 2014-11-26 DIAGNOSIS — Y838 Other surgical procedures as the cause of abnormal reaction of the patient, or of later complication, without mention of misadventure at the time of the procedure: Secondary | ICD-10-CM | POA: Insufficient documentation

## 2014-11-26 DIAGNOSIS — Z853 Personal history of malignant neoplasm of breast: Secondary | ICD-10-CM | POA: Insufficient documentation

## 2014-11-26 DIAGNOSIS — C50212 Malignant neoplasm of upper-inner quadrant of left female breast: Secondary | ICD-10-CM

## 2014-11-26 DIAGNOSIS — Z95828 Presence of other vascular implants and grafts: Secondary | ICD-10-CM

## 2014-11-26 HISTORY — PX: PORTACATH PLACEMENT: SHX2246

## 2014-11-26 LAB — GLUCOSE, CAPILLARY
GLUCOSE-CAPILLARY: 94 mg/dL (ref 70–99)
GLUCOSE-CAPILLARY: 83 mg/dL (ref 70–99)
GLUCOSE-CAPILLARY: 140 mg/dL — AB (ref 70–99)
Glucose-Capillary: 127 mg/dL — ABNORMAL HIGH (ref 70–99)
Glucose-Capillary: 103 mg/dL — ABNORMAL HIGH (ref 70–99)
Glucose-Capillary: 114 mg/dL — ABNORMAL HIGH (ref 70–99)
Glucose-Capillary: 124 mg/dL — ABNORMAL HIGH (ref 70–99)
Glucose-Capillary: 111 mg/dL — ABNORMAL HIGH (ref 70–99)

## 2014-11-26 SURGERY — INSERTION, TUNNELED CENTRAL VENOUS DEVICE, WITH PORT
Anesthesia: Monitor Anesthesia Care | Site: Chest | Laterality: Left

## 2014-11-26 SURGERY — INSERTION, TUNNELED CENTRAL VENOUS DEVICE, WITH PORT
Anesthesia: General | Site: Chest | Laterality: Left

## 2014-11-26 MED ORDER — FENTANYL CITRATE 0.05 MG/ML IJ SOLN
INTRAMUSCULAR | Status: DC | PRN
  Administered 2014-11-26: 100 ug via INTRAVENOUS

## 2014-11-26 MED ORDER — LACTATED RINGERS IV SOLN
INTRAVENOUS | Status: DC
  Administered 2014-11-26: 10:00:00 via INTRAVENOUS

## 2014-11-26 MED ORDER — BUPIVACAINE-EPINEPHRINE 0.25% -1:200000 IJ SOLN
INTRAMUSCULAR | Status: DC | PRN
  Administered 2014-11-26: 1 mL

## 2014-11-26 MED ORDER — LIDOCAINE HCL (PF) 1 % IJ SOLN
INTRAMUSCULAR | Status: AC
  Filled 2014-11-26: qty 30

## 2014-11-26 MED ORDER — DEXTROSE 50 % IV SOLN
INTRAVENOUS | Status: AC
  Filled 2014-11-26: qty 50

## 2014-11-26 MED ORDER — SODIUM BICARBONATE 4 % IV SOLN
INTRAVENOUS | Status: AC
  Filled 2014-11-26: qty 5

## 2014-11-26 MED ORDER — MEPERIDINE HCL 25 MG/ML IJ SOLN: 6.2500 mg | INTRAMUSCULAR | Status: DC | PRN

## 2014-11-26 MED ORDER — LIDOCAINE HCL (CARDIAC) 20 MG/ML IV SOLN
INTRAVENOUS | Status: AC
  Filled 2014-11-26: qty 5

## 2014-11-26 MED ORDER — HYDROCODONE-ACETAMINOPHEN 5-325 MG PO TABS: 1.0000 | ORAL_TABLET | ORAL | Status: DC | PRN

## 2014-11-26 MED ORDER — PROPOFOL 10 MG/ML IV BOLUS
INTRAVENOUS | Status: DC | PRN
  Administered 2014-11-26: 150 mg via INTRAVENOUS
  Administered 2014-11-26: 50 mg via INTRAVENOUS

## 2014-11-26 MED ORDER — MIDAZOLAM HCL 2 MG/2ML IJ SOLN
INTRAMUSCULAR | Status: AC
  Filled 2014-11-26: qty 2

## 2014-11-26 MED ORDER — ONDANSETRON HCL 4 MG/2ML IJ SOLN: 4.0000 mg | Freq: Once | INTRAMUSCULAR | Status: DC | PRN

## 2014-11-26 MED ORDER — ONDANSETRON HCL 4 MG/2ML IJ SOLN
INTRAMUSCULAR | Status: AC
  Filled 2014-11-26: qty 2

## 2014-11-26 MED ORDER — PROPOFOL 10 MG/ML IV BOLUS
INTRAVENOUS | Status: DC | PRN
  Administered 2014-11-26: 30 mg via INTRAVENOUS

## 2014-11-26 MED ORDER — ONDANSETRON HCL 4 MG/2ML IJ SOLN
INTRAMUSCULAR | Status: DC | PRN
  Administered 2014-11-26: 4 mg via INTRAVENOUS

## 2014-11-26 MED ORDER — HEPARIN SOD (PORK) LOCK FLUSH 100 UNIT/ML IV SOLN
INTRAVENOUS | Status: AC
  Filled 2014-11-26: qty 5

## 2014-11-26 MED ORDER — PROPOFOL 10 MG/ML IV BOLUS
INTRAVENOUS | Status: AC
  Filled 2014-11-26: qty 20

## 2014-11-26 MED ORDER — FENTANYL CITRATE 0.05 MG/ML IJ SOLN: 25.0000 ug | INTRAMUSCULAR | Status: DC | PRN

## 2014-11-26 MED ORDER — ROCURONIUM BROMIDE 50 MG/5ML IV SOLN
INTRAVENOUS | Status: AC
  Filled 2014-11-26: qty 1

## 2014-11-26 MED ORDER — BUPIVACAINE HCL (PF) 0.25 % IJ SOLN
INTRAMUSCULAR | Status: AC
Start: 2014-11-26 — End: 2014-11-26
  Filled 2014-11-26: qty 30

## 2014-11-26 MED ORDER — FENTANYL CITRATE 0.05 MG/ML IJ SOLN
INTRAMUSCULAR | Status: DC | PRN
  Administered 2014-11-26: 50 ug via INTRAVENOUS

## 2014-11-26 MED ORDER — ARTIFICIAL TEARS OP OINT
TOPICAL_OINTMENT | OPHTHALMIC | Status: AC
  Filled 2014-11-26: qty 3.5

## 2014-11-26 MED ORDER — LACTATED RINGERS IV SOLN
INTRAVENOUS | Status: DC | PRN
  Administered 2014-11-26: 07:00:00 via INTRAVENOUS

## 2014-11-26 MED ORDER — SODIUM BICARBONATE 4 % IV SOLN
INTRAVENOUS | Status: DC | PRN
  Administered 2014-11-26: 5 mL via INTRAVENOUS

## 2014-11-26 MED ORDER — PROMETHAZINE HCL 25 MG/ML IJ SOLN: 6.2500 mg | INTRAMUSCULAR | Status: DC | PRN

## 2014-11-26 MED ORDER — SODIUM CHLORIDE 0.9 % IJ SOLN
INTRAMUSCULAR | Status: AC
Start: 2014-11-26 — End: 2014-11-26
  Filled 2014-11-26: qty 10

## 2014-11-26 MED ORDER — CEFAZOLIN SODIUM-DEXTROSE 2-3 GM-% IV SOLR
2.0000 g | Freq: Once | INTRAVENOUS | Status: AC
  Administered 2014-11-26 (×2): 2 g via INTRAVENOUS

## 2014-11-26 MED ORDER — 0.9 % SODIUM CHLORIDE (POUR BTL) OPTIME
TOPICAL | Status: DC | PRN
  Administered 2014-11-26: 1000 mL

## 2014-11-26 MED ORDER — HEPARIN SOD (PORK) LOCK FLUSH 100 UNIT/ML IV SOLN
INTRAVENOUS | Status: DC | PRN
  Administered 2014-11-26: 500 [IU]

## 2014-11-26 MED ORDER — FENTANYL CITRATE 0.05 MG/ML IJ SOLN
25.0000 ug | INTRAMUSCULAR | Status: DC | PRN
  Administered 2014-11-26: 25 ug via INTRAVENOUS
  Filled 2014-11-26: qty 2

## 2014-11-26 MED ORDER — BUPIVACAINE-EPINEPHRINE (PF) 0.25% -1:200000 IJ SOLN
INTRAMUSCULAR | Status: AC
Start: 2014-11-26 — End: 2014-11-26
  Filled 2014-11-26: qty 30

## 2014-11-26 MED ORDER — HEPARIN SOD (PORK) LOCK FLUSH 100 UNIT/ML IV SOLN
INTRAVENOUS | Status: DC | PRN
  Administered 2014-11-26: 500 [IU] via INTRAVENOUS

## 2014-11-26 MED ORDER — DEXTROSE 50 % IV SOLN
INTRAVENOUS | Status: DC | PRN
  Administered 2014-11-26: 12.5 g via INTRAVENOUS

## 2014-11-26 MED ORDER — CEFAZOLIN SODIUM-DEXTROSE 2-3 GM-% IV SOLR
INTRAVENOUS | Status: AC
  Filled 2014-11-26: qty 50

## 2014-11-26 MED ORDER — FENTANYL CITRATE 0.05 MG/ML IJ SOLN
INTRAMUSCULAR | Status: AC
  Filled 2014-11-26: qty 5

## 2014-11-26 MED ORDER — BUPIVACAINE-EPINEPHRINE (PF) 0.25% -1:200000 IJ SOLN
INTRAMUSCULAR | Status: AC
  Filled 2014-11-26: qty 30

## 2014-11-26 MED ORDER — EPHEDRINE SULFATE 50 MG/ML IJ SOLN
INTRAMUSCULAR | Status: AC
  Filled 2014-11-26: qty 1

## 2014-11-26 MED ORDER — SODIUM CHLORIDE 0.9 % IR SOLN
Status: DC | PRN
  Administered 2014-11-26: 500 mL

## 2014-11-26 MED ORDER — SUCCINYLCHOLINE CHLORIDE 20 MG/ML IJ SOLN
INTRAMUSCULAR | Status: AC
  Filled 2014-11-26: qty 1

## 2014-11-26 MED ORDER — LIDOCAINE HCL (CARDIAC) 10 MG/ML IV SOLN
INTRAVENOUS | Status: DC | PRN
  Administered 2014-11-26: 70 mg via INTRAVENOUS

## 2014-11-26 MED ORDER — PHENYLEPHRINE 40 MCG/ML (10ML) SYRINGE FOR IV PUSH (FOR BLOOD PRESSURE SUPPORT)
PREFILLED_SYRINGE | INTRAVENOUS | Status: AC
  Filled 2014-11-26: qty 10

## 2014-11-26 MED ORDER — PROPOFOL INFUSION 10 MG/ML OPTIME
INTRAVENOUS | Status: DC | PRN
  Administered 2014-11-26: 50 ug/kg/min via INTRAVENOUS

## 2014-11-26 MED ORDER — LIDOCAINE HCL (PF) 1 % IJ SOLN
INTRAMUSCULAR | Status: DC | PRN
  Administered 2014-11-26: 30 mL

## 2014-11-26 MED ORDER — ACETAMINOPHEN 10 MG/ML IV SOLN
INTRAVENOUS | Status: AC
  Filled 2014-11-26: qty 100

## 2014-11-26 MED ORDER — PHENYLEPHRINE HCL 10 MG/ML IJ SOLN
INTRAMUSCULAR | Status: DC | PRN
  Administered 2014-11-26: 80 ug via INTRAVENOUS

## 2014-11-26 MED ORDER — LACTATED RINGERS IV SOLN
INTRAVENOUS | Status: DC | PRN
  Administered 2014-11-26: 17:00:00 via INTRAVENOUS

## 2014-11-26 MED ORDER — BUPIVACAINE-EPINEPHRINE 0.25% -1:200000 IJ SOLN
INTRAMUSCULAR | Status: DC | PRN
  Administered 2014-11-26: 30 mL

## 2014-11-26 SURGICAL SUPPLY — 47 items
BAG DECANTER FOR FLEXI CONT (MISCELLANEOUS) ×2 IMPLANT
CHLORAPREP W/TINT 10.5 ML (MISCELLANEOUS) ×3 IMPLANT
COVER SURGICAL LIGHT HANDLE (MISCELLANEOUS) ×2 IMPLANT
CRADLE DONUT ADULT HEAD (MISCELLANEOUS) ×2 IMPLANT
DECANTER SPIKE VIAL GLASS SM (MISCELLANEOUS) IMPLANT
DRAPE C-ARM 42X72 X-RAY (DRAPES) ×2 IMPLANT
DRAPE CHEST BREAST 15X10 FENES (DRAPES) ×2 IMPLANT
DRAPE UTILITY XL STRL (DRAPES) ×3 IMPLANT
ELECT CAUTERY BLADE 6.4 (BLADE) ×2 IMPLANT
ELECT REM PT RETURN 9FT ADLT (ELECTROSURGICAL) ×2
ELECTRODE REM PT RTRN 9FT ADLT (ELECTROSURGICAL) ×1 IMPLANT
GAUZE SPONGE 4X4 16PLY XRAY LF (GAUZE/BANDAGES/DRESSINGS) ×2 IMPLANT
GLOVE BIOGEL PI IND STRL 8 (GLOVE) ×1 IMPLANT
GLOVE BIOGEL PI INDICATOR 8 (GLOVE) ×1
GLOVE ECLIPSE 7.5 STRL STRAW (GLOVE) ×2 IMPLANT
GOWN STRL REUS W/ TWL LRG LVL3 (GOWN DISPOSABLE) ×1 IMPLANT
GOWN STRL REUS W/ TWL XL LVL3 (GOWN DISPOSABLE) ×1 IMPLANT
GOWN STRL REUS W/TWL LRG LVL3 (GOWN DISPOSABLE) ×2
GOWN STRL REUS W/TWL XL LVL3 (GOWN DISPOSABLE) ×2
INTRODUCER COOK 11FR (CATHETERS) IMPLANT
IV CATH 14GX2 1/4 (CATHETERS) IMPLANT
KIT BASIN OR (CUSTOM PROCEDURE TRAY) ×2 IMPLANT
KIT PORT POWER 9.6FR MRI PREA (Catheter) IMPLANT
KIT PORT POWER ISP 8FR (Catheter) ×1 IMPLANT
KIT ROOM TURNOVER OR (KITS) ×2 IMPLANT
LIQUID BAND (GAUZE/BANDAGES/DRESSINGS) ×2 IMPLANT
NDL HYPO 25GX1X1/2 BEV (NEEDLE) ×1 IMPLANT
NEEDLE 22X1 1/2 (OR ONLY) (NEEDLE) ×2 IMPLANT
NEEDLE HYPO 25GX1X1/2 BEV (NEEDLE) ×2 IMPLANT
NS IRRIG 1000ML POUR BTL (IV SOLUTION) ×2 IMPLANT
PACK SURGICAL SETUP 50X90 (CUSTOM PROCEDURE TRAY) ×2 IMPLANT
PAD ARMBOARD 7.5X6 YLW CONV (MISCELLANEOUS) ×4 IMPLANT
PENCIL BUTTON HOLSTER BLD 10FT (ELECTRODE) ×2 IMPLANT
SET INTRODUCER 12FR PACEMAKER (SHEATH) IMPLANT
SET SHEATH INTRODUCER 10FR (MISCELLANEOUS) IMPLANT
SHEATH COOK PEEL AWAY SET 9F (SHEATH) IMPLANT
SUT MON AB 5-0 PS2 18 (SUTURE) ×2 IMPLANT
SUT PROLENE 2 0 SH DA (SUTURE) ×2 IMPLANT
SUT SILK 2 0 (SUTURE) ×2
SUT SILK 2-0 18XBRD TIE 12 (SUTURE) ×1 IMPLANT
SYR 20ML ECCENTRIC (SYRINGE) ×4 IMPLANT
SYR 5ML LUER SLIP (SYRINGE) ×2 IMPLANT
SYR BULB 3OZ (MISCELLANEOUS) ×2 IMPLANT
SYR CONTROL 10ML LL (SYRINGE) ×2 IMPLANT
TOWEL OR 17X24 6PK STRL BLUE (TOWEL DISPOSABLE) ×1 IMPLANT
TOWEL OR 17X26 10 PK STRL BLUE (TOWEL DISPOSABLE) ×2 IMPLANT
WATER STERILE IRR 1000ML POUR (IV SOLUTION) IMPLANT

## 2014-11-26 SURGICAL SUPPLY — 50 items
ADH SKN CLS APL DERMABOND .7 (GAUZE/BANDAGES/DRESSINGS) ×1
BAG DECANTER FOR FLEXI CONT (MISCELLANEOUS) ×2 IMPLANT
CHLORAPREP W/TINT 10.5 ML (MISCELLANEOUS) ×2 IMPLANT
COVER SURGICAL LIGHT HANDLE (MISCELLANEOUS) ×2 IMPLANT
CRADLE DONUT ADULT HEAD (MISCELLANEOUS) ×2 IMPLANT
DECANTER SPIKE VIAL GLASS SM (MISCELLANEOUS) IMPLANT
DERMABOND ADVANCED (GAUZE/BANDAGES/DRESSINGS) ×1
DERMABOND ADVANCED .7 DNX12 (GAUZE/BANDAGES/DRESSINGS) IMPLANT
DRAPE C-ARM 42X72 X-RAY (DRAPES) ×2 IMPLANT
DRAPE CHEST BREAST 15X10 FENES (DRAPES) ×2 IMPLANT
DRAPE UTILITY XL STRL (DRAPES) ×4 IMPLANT
ELECT CAUTERY BLADE 6.4 (BLADE) ×2 IMPLANT
ELECT REM PT RETURN 9FT ADLT (ELECTROSURGICAL) ×2
ELECTRODE REM PT RTRN 9FT ADLT (ELECTROSURGICAL) ×1 IMPLANT
GAUZE SPONGE 4X4 16PLY XRAY LF (GAUZE/BANDAGES/DRESSINGS) ×2 IMPLANT
GLOVE BIOGEL PI IND STRL 8 (GLOVE) ×1 IMPLANT
GLOVE BIOGEL PI INDICATOR 8 (GLOVE) ×1
GLOVE ECLIPSE 7.5 STRL STRAW (GLOVE) ×2 IMPLANT
GOWN STRL REUS W/ TWL LRG LVL3 (GOWN DISPOSABLE) ×1 IMPLANT
GOWN STRL REUS W/ TWL XL LVL3 (GOWN DISPOSABLE) ×1 IMPLANT
GOWN STRL REUS W/TWL LRG LVL3 (GOWN DISPOSABLE) ×2
GOWN STRL REUS W/TWL XL LVL3 (GOWN DISPOSABLE) ×2
INTRODUCER COOK 11FR (CATHETERS) IMPLANT
IV CATH 14GX2 1/4 (CATHETERS) IMPLANT
KIT BASIN OR (CUSTOM PROCEDURE TRAY) ×2 IMPLANT
KIT PORT POWER 9.6FR MRI PREA (Catheter) IMPLANT
KIT PORT POWER ISP 8FR (Catheter) IMPLANT
KIT ROOM TURNOVER OR (KITS) ×2 IMPLANT
LIQUID BAND (GAUZE/BANDAGES/DRESSINGS) ×2 IMPLANT
NDL HYPO 25GX1X1/2 BEV (NEEDLE) ×1 IMPLANT
NEEDLE 22X1 1/2 (OR ONLY) (NEEDLE) ×2 IMPLANT
NEEDLE HYPO 25GX1X1/2 BEV (NEEDLE) ×2 IMPLANT
NS IRRIG 1000ML POUR BTL (IV SOLUTION) ×2 IMPLANT
PACK SURGICAL SETUP 50X90 (CUSTOM PROCEDURE TRAY) ×2 IMPLANT
PAD ARMBOARD 7.5X6 YLW CONV (MISCELLANEOUS) ×4 IMPLANT
PENCIL BUTTON HOLSTER BLD 10FT (ELECTRODE) ×2 IMPLANT
SET INTRODUCER 12FR PACEMAKER (SHEATH) IMPLANT
SET SHEATH INTRODUCER 10FR (MISCELLANEOUS) IMPLANT
SHEATH COOK PEEL AWAY SET 9F (SHEATH) IMPLANT
SUT MON AB 5-0 PS2 18 (SUTURE) ×2 IMPLANT
SUT PROLENE 2 0 SH DA (SUTURE) ×2 IMPLANT
SUT SILK 2 0 (SUTURE)
SUT SILK 2-0 18XBRD TIE 12 (SUTURE) IMPLANT
SYR 20ML ECCENTRIC (SYRINGE) ×4 IMPLANT
SYR 5ML LUER SLIP (SYRINGE) ×2 IMPLANT
SYR BULB 3OZ (MISCELLANEOUS) ×2 IMPLANT
SYR CONTROL 10ML LL (SYRINGE) ×2 IMPLANT
TOWEL OR 17X24 6PK STRL BLUE (TOWEL DISPOSABLE) ×2 IMPLANT
TOWEL OR 17X26 10 PK STRL BLUE (TOWEL DISPOSABLE) ×2 IMPLANT
WATER STERILE IRR 1000ML POUR (IV SOLUTION) IMPLANT

## 2014-11-26 NOTE — Anesthesia Preprocedure Evaluation (Signed)
Anesthesia Evaluation  Patient identified by MRN, date of birth, ID band Patient awake    Reviewed: Allergy & Precautions, NPO status , Patient's Chart, lab work & pertinent test results  Airway        Dental  (+) Dental Advisory Given   Pulmonary          Cardiovascular hypertension, Pt. on medications     Neuro/Psych    GI/Hepatic GERD-  ,  Endo/Other  diabetes, Oral Hypoglycemic Agents  Renal/GU      Musculoskeletal  (+) Arthritis -,   Abdominal   Peds  Hematology  (+) anemia ,   Anesthesia Other Findings   Reproductive/Obstetrics                             Anesthesia Physical Anesthesia Plan  ASA: II  Anesthesia Plan: General   Post-op Pain Management:    Induction: Intravenous  Airway Management Planned: LMA  Additional Equipment:   Intra-op Plan:   Post-operative Plan: Extubation in OR  Informed Consent: I have reviewed the patients History and Physical, chart, labs and discussed the procedure including the risks, benefits and alternatives for the proposed anesthesia with the patient or authorized representative who has indicated his/her understanding and acceptance.   Dental advisory given  Plan Discussed with: Anesthesiologist and Surgeon  Anesthesia Plan Comments:         Anesthesia Quick Evaluation

## 2014-11-26 NOTE — Transfer of Care (Signed)
Immediate Anesthesia Transfer of Care Note  Patient: Brianna Valdez  Procedure(s) Performed: Procedure(s): REPOSITION OF A PORT-A-CATH (Left)  Patient Location: PACU  Anesthesia Type:MAC  Level of Consciousness: awake and alert   Airway & Oxygen Therapy: Patient Spontanous Breathing and Patient connected to face mask oxygen  Post-op Assessment: Report given to RN and Post -op Vital signs reviewed and stable  Post vital signs: Reviewed and stable  Last Vitals:  Filed Vitals:   11/26/14 1915  BP:   Pulse:   Temp: 36.6 C  Resp:     Complications: No apparent anesthesia complications

## 2014-11-26 NOTE — Anesthesia Procedure Notes (Signed)
Procedure Name: MAC Date/Time: 11/26/2014 6:35 PM Performed by: Izora Gala Pre-anesthesia Checklist: Patient identified, Emergency Drugs available, Suction available and Patient being monitored Preoxygenation: Pre-oxygenation with 100% oxygen Ventilation: Oral airway inserted - appropriate to patient size Placement Confirmation: positive ETCO2 and breath sounds checked- equal and bilateral

## 2014-11-26 NOTE — Anesthesia Postprocedure Evaluation (Signed)
Anesthesia Post Note  Patient: Brianna Valdez  Procedure(s) Performed: Procedure(s) (LRB): REPOSITION OF A PORT-A-CATH (Left)  Anesthesia type: MAC  Patient location: PACU  Post pain: Pain level controlled and Adequate analgesia  Post assessment: Post-op Vital signs reviewed, Patient's Cardiovascular Status Stable and Respiratory Function Stable  Last Vitals:  Filed Vitals:   11/26/14 1915  BP:   Pulse:   Temp: 36.6 C  Resp:     Post vital signs: Reviewed and stable  Level of consciousness: awake, alert  and oriented  Complications: No apparent anesthesia complications

## 2014-11-26 NOTE — Progress Notes (Signed)
Late entry for 915am: Patient is going back to surgery per Dr. Excell Seltzer later this afternoon. Will go to short stay until then. Remains NPO.

## 2014-11-26 NOTE — Anesthesia Postprocedure Evaluation (Signed)
Anesthesia Post Note  Patient: Brianna Valdez  Procedure(s) Performed: Procedure(s) (LRB): INSERTION PORT-A-CATH (Left)  Anesthesia type: General  Patient location: PACU  Post pain: Pain level controlled  Post assessment: Post-op Vital signs reviewed  Last Vitals: BP 133/60 mmHg  Pulse 77  Temp(Src) 36.4 C (Oral)  Resp 13  Ht 5\' 2"  (1.575 m)  Wt 158 lb (71.668 kg)  BMI 28.89 kg/m2  SpO2 98%  Post vital signs: Reviewed  Level of consciousness: sedated  Complications: No apparent anesthesia complications

## 2014-11-26 NOTE — Anesthesia Preprocedure Evaluation (Signed)
Anesthesia Evaluation  Patient identified by MRN, date of birth, ID band Patient awake    Reviewed: Allergy & Precautions, NPO status , Patient's Chart, lab work & pertinent test results  Airway Mallampati: II  TM Distance: >3 FB Neck ROM: Full    Dental no notable dental hx. (+) Dental Advisory Given   Pulmonary  breath sounds clear to auscultation  Pulmonary exam normal       Cardiovascular hypertension, Pt. on medications Rhythm:Regular Rate:Normal     Neuro/Psych    GI/Hepatic GERD-  ,  Endo/Other  diabetes, Type 2, Oral Hypoglycemic Agents  Renal/GU      Musculoskeletal  (+) Arthritis -,   Abdominal   Peds  Hematology  (+) anemia ,   Anesthesia Other Findings   Reproductive/Obstetrics                             Anesthesia Physical  Anesthesia Plan  ASA: II  Anesthesia Plan: General and MAC   Post-op Pain Management:    Induction: Intravenous  Airway Management Planned: LMA and Simple Face Mask  Additional Equipment:   Intra-op Plan:   Post-operative Plan: Extubation in OR  Informed Consent: I have reviewed the patients History and Physical, chart, labs and discussed the procedure including the risks, benefits and alternatives for the proposed anesthesia with the patient or authorized representative who has indicated his/her understanding and acceptance.   Dental advisory given  Plan Discussed with: CRNA  Anesthesia Plan Comments:         Anesthesia Quick Evaluation

## 2014-11-26 NOTE — H&P (Signed)
  History: Patient is status post Port-A-Cath placement via the left internal jugular vein earlier this morning. Fluoroscopy in the operating room at the time of placement showed good position of the catheter tip in the distal SVC. However, follow-up chest x-ray on return to the PACU showed that the catheter has pulled back slightly and the tip has flipped up likely into the right innominate vein. She has just some mild soreness. I've explained the situation to the patient and her husband and I have recommended returning to the operating room under local anesthesia with sedation to advance and reposition the catheter.  Past Medical History  Diagnosis Date  . Hypertension   . Diabetes mellitus   . Breast cancer of upper-inner quadrant of left female breast 11/06/2014  . Anemia   . Thyroid disease   . GERD (gastroesophageal reflux disease)   . Diabetic neuropathy     slight in her feet  . Arthritis   . Glaucoma    Past Surgical History  Procedure Laterality Date  . Abdominal hysterectomy    . Thyroidectomy    . Eye surgery Bilateral     cataract surgery with lens implant  . Colonoscopy     Current Facility-Administered Medications  Medication Dose Route Frequency Provider Last Rate Last Dose  . acetaminophen (OFIRMEV) 10 MG/ML IV           . ceFAZolin (ANCEF) IVPB 2 g/50 mL premix  2 g Intravenous Once Excell Seltzer, MD      . chlorhexidine (HIBICLENS) 4 % liquid 1 application  1 application Topical Once Excell Seltzer, MD      . chlorhexidine (HIBICLENS) 4 % liquid 1 application  1 application Topical Once Excell Seltzer, MD      . dextrose 50 % solution           . fentaNYL (SUBLIMAZE) injection 25-50 mcg  25-50 mcg Intravenous Q5 min PRN Nickie Retort, MD   25 mcg at 11/26/14 1221  . lactated ringers infusion   Intravenous Continuous Nickie Retort, MD 50 mL/hr at 11/26/14 1025    . meperidine (DEMEROL) injection 6.25-12.5 mg  6.25-12.5 mg Intravenous Q5 min PRN Nickie Retort, MD      . promethazine (PHENERGAN) injection 6.25-12.5 mg  6.25-12.5 mg Intravenous Q15 min PRN Nickie Retort, MD       Facility-Administered Medications Ordered in Other Encounters  Medication Dose Route Frequency Provider Last Rate Last Dose  . dextrose 50 % solution    Anesthesia Intra-op Izora Gala, CRNA   12.5 g at 11/26/14 1725   Exam: BP 159/66 mmHg  Pulse 75  Temp(Src) 97.6 F (36.4 C) (Oral)  Resp 8  Ht 5\' 2"  (1.575 m)  Wt 158 lb (71.668 kg)  BMI 28.89 kg/m2  SpO2 100% Gen.: Alert elderly female Lungs: Clear without increased work of breathing Incisions: Clean and without bleeding or swelling  Assessment and plan: Port-A-Cath with tip flipped out of position. I believe this can be repositioned and not replaced by advancing the catheter. Again, I have explained the situation to the patient and her husband and all their questions were answered. I discussed the small chance of having to replace the catheter. There and agreement.

## 2014-11-26 NOTE — Op Note (Signed)
Preoperative Diagnosis: cancer right breast Port-a-Cath  Postoprative Diagnosis: cancer right breast Port-a-Cath  Procedure: Procedure(s): REPOSITION OF A PORT-A-CATH   Surgeon: Excell Seltzer T   Assistants: None  Anesthesia:  Monitored Local Anesthesia with Sedation  Indications: Patient is status post Port-A-Cath placement for advanced breast cancer earlier today with access via the left internal jugular vein. Fluoroscopy in the operating room during the procedure showed the catheter in good position in the distal vena cava. However shortly thereafter follow up chest x-ray in the recovery room showed the tip of the catheter displaced out toward the right innominate vein and possibly pulled back as well. I therefore recommended return to the operating room and repositioning the catheter under local anesthesia with sedation. This was all discussed with the patient and family and they are in agreement.    Procedure Detail:  Patient was brought to the operating room, placed in the supine position on the operating table, and IV sedation administered. She was carefully positioned with arms tucked and the entire chest and neck were widely sterilely prepped and draped. Interestingly, fluoroscopy was performed prior to manipulating the catheter at all and this showed the tip of the catheter to be back in excellent position in the distal SVC, SVC atrial junction. I elected to try to advance the catheter a little bit further to prevent any future displacement. The small puncture wound at the left IJ site was opened and the catheter exposed. I pulled up about a centimeter of slack from the port into the catheter and this was advanced this distance into the IJ vein. Fluoroscopy was again performed at this point which showed good position of the catheter at the cavoatrial junction. Copies were saved for official reading. The wound was closed with a single 4-0 Monocryl suture and Dermabond. Sponge needle  and instrument counts were correct.     Findings: As above         Complications:  * No complications entered in OR log *         Disposition: PACU - hemodynamically stable.         Condition: stable

## 2014-11-26 NOTE — Anesthesia Procedure Notes (Signed)
Procedure Name: LMA Insertion Date/Time: 11/26/2014 7:22 AM Performed by: Carola Frost Pre-anesthesia Checklist: Patient identified, Timeout performed, Emergency Drugs available, Suction available and Patient being monitored Patient Re-evaluated:Patient Re-evaluated prior to inductionOxygen Delivery Method: Circle system utilized Preoxygenation: Pre-oxygenation with 100% oxygen Intubation Type: IV induction Ventilation: Mask ventilation without difficulty LMA: LMA inserted LMA Size: 4.0 Number of attempts: 1 Placement Confirmation: positive ETCO2 and breath sounds checked- equal and bilateral Tube secured with: Tape Dental Injury: Teeth and Oropharynx as per pre-operative assessment

## 2014-11-26 NOTE — H&P (Signed)
Brianna Valdez 11/13/2014 9:11 AM Location: [facility] Patient #: [id number] DOB: 08-Aug-1931 Undefined / Language: Brianna Valdez / Race: Undefined Female  History of Present Illness Brianna Valdez Kitchen T. Garan Frappier MD; 11/13/2014 11:00 AM) The patient is a 81 year old female who presents with breast cancer. She is a postmenopausal female referred by Dr. Cleatis Valdez for evaluation of recently diagnosed carcinoma of the `right breast. She recently presented for a screening mamogram revealing A new mass in the upper inner quadrantof the right breast. Subsequent imaging included diagnostic mamogram showing an irregular density measuring approximately 4.6 cm in the upper inner quadrant and ultrasound showing A 3 cm suspicious mass at approximately 2:00 position 8 cm from the nipple as well as 2 small, 3 and 5 mm lymph nodes in the right axilla with cortical thickening. An ultrasound guided breast biopsy and right axillary lymph node biopsy was performed on 11/01/2014 with pathology revealing invasive ductalcarcinoma of the breast as well as metastatic cancer in the axillary lymph node. She is seen now in breast multidisciplinary clinic for initial treatment planning. She has experienced no breast symptoms and says she had not noticed a lump or pain or nipple discharge or skin changes. She does not have a personal history of any previous breast problems. Breast MRI is pending Findings at that time were the following: Tumor size: 4 cm Tumor grade: 3 Estrogen Receptor: negative Progesterone Receptor: negative Her-2 neu: negative Lymph node status: positive   Other Problems Brianna Nodal, Valdez; 11/13/2014 9:11 AM) Arthritis Diabetes Mellitus Gastroesophageal Reflux Disease High blood pressure Hypercholesterolemia Oophorectomy Bilateral. Thyroid Disease  Past Surgical History Brianna Nodal, Valdez; 11/13/2014 9:11 AM) Breast Biopsy Right. Cataract Surgery Bilateral. Hysterectomy (due  to cancer) - Complete Oral Surgery Sentinel Lymph Node Biopsy Thyroid Surgery  Diagnostic Studies History Brianna Nodal, Valdez; 11/13/2014 9:11 AM) Colonoscopy 1-5 years ago Mammogram within last year Pap Smear >5 years ago  Social History Brianna Nodal, Valdez; 11/13/2014 9:11 AM) Caffeine use Coffee, Tea. No alcohol use No drug use Tobacco use Never smoker.  Family History Brianna Nodal, Valdez; 11/13/2014 9:11 AM) Alcohol Abuse Father. Arthritis Mother, Sister. Cerebrovascular Accident Mother. Diabetes Mellitus Mother. Seizure disorder Son. Thyroid problems Mother.  Pregnancy / Birth History Brianna Nodal, Valdez; 11/13/2014 9:11 AM) Age at menarche 39 years. Age of menopause 75-50 Contraceptive History Intrauterine device. Gravida 4 Irregular periods Maternal age 19-30 Para 4  Review of Systems Brianna Valdez; 11/13/2014 9:11 AM) General Present- Fatigue. Not Present- Appetite Loss, Chills, Fever, Night Sweats, Weight Gain and Weight Loss. Skin Present- Change in Wart/Mole and Dryness. Not Present- Hives, Jaundice, New Lesions, Non-Healing Wounds, Rash and Ulcer. HEENT Present- Hearing Loss and Wears glasses/contact lenses. Not Present- Earache, Hoarseness, Nose Bleed, Oral Ulcers, Ringing in the Ears, Seasonal Allergies, Sinus Pain, Sore Throat, Visual Disturbances and Yellow Eyes. Respiratory Not Present- Bloody sputum, Chronic Cough, Difficulty Breathing, Snoring and Wheezing. Cardiovascular Not Present- Chest Pain, Difficulty Breathing Lying Down, Leg Cramps, Palpitations, Rapid Heart Rate, Shortness of Breath and Swelling of Extremities. Gastrointestinal Present- Excessive gas and Indigestion. Not Present- Abdominal Pain, Bloating, Bloody Stool, Change in Bowel Habits, Chronic diarrhea, Constipation, Difficulty Swallowing, Gets full quickly at meals, Hemorrhoids, Nausea, Rectal Pain and Vomiting. Female Genitourinary Present- Nocturia. Not Present-  Frequency, Painful Urination, Pelvic Pain and Urgency. Musculoskeletal Present- Joint Pain. Not Present- Back Pain, Joint Stiffness, Muscle Pain, Muscle Weakness and Swelling of Extremities. Neurological Present- Decreased Memory. Not Present- Fainting, Headaches, Numbness, Seizures, Tingling, Tremor, Trouble walking and Weakness.  Psychiatric Present- Anxiety and Change in Sleep Pattern. Not Present- Bipolar, Depression, Fearful and Frequent crying. Endocrine Not Present- Cold Intolerance, Excessive Hunger, Hair Changes, Heat Intolerance, Hot flashes and New Diabetes. Hematology Not Present- Easy Bruising, Excessive bleeding, Gland problems, HIV and Persistent Infections.   Physical Exam Brianna Valdez Kitchen T. Braxtyn Bojarski MD; 11/13/2014 11:01 AM) The physical exam findings are as follows: Note:General: Alert, well-developed and well nourished elderly African-American female appearing somewhat younger than her stated age, in no distress Skin: Warm and dry without rash or infection. HEENT: No palpable masses or thyromegaly. Sclera nonicteric. Pupils equal round and reactive. Oropharynx clear. Lymph nodes: No cervical, supraclavicular, or inguinal nodes palpable. breasts: There is a firm approximately 3 cm freely movable palpable mass in the upper inner right breast. There is some firmness palpable high in the right axilla, questionable palpable adenopathy Lungs: Breath sounds clear and equal. No wheezing or increased work of breathing. Cardiovascular: Regular rate and rhythm without murmer. No JVD or edema. Peripheral pulses intact. No carotid bruits. Abdomen: Nondistended. Soft and nontender. No masses palpable. No organomegaly. No palpable hernias. Extremities: No edema or joint swelling or deformity. No chronic venous stasis changes. Neurologic: Alert and fully oriented. Gait normal. No focal weakness. Psychiatric: Normal mood and affect. Thought content appropriate with normal judgement and  insight    Assessment & Plan Brianna Valdez Kitchen T. Brianna Woodfield MD; 11/13/2014 11:06 AM) BREAST CANCER, RIGHT (174.9  C50.911) Impression: 81 year old female with new diagnosis of T2, N1 triple nest.ive invasive ductal carcinoma of the right breast. On evaluation by medical oncology today she is felt to be a candidate for chemotherapy. We feel she would be best treated with neoadjuvant chemotherapy. I discussed the nature of the diagnosis and findings in detail with the patient and her family today. We discussed surgical treatment of breast cancerin general terms and discussed that her specific surgical recommendation would not be given until we see the results of her chemotherapy. She could be a candidate for lumpectomyand possibly sentinel lymph node biopsy under protocol. We discussed placement of Port-A-Cath for chemotherapy including its indication and anesthetic risks as well as risks of bleeding, infection, pneumothorax and more long-term risks of catheter occlusion or displacement and DVT. She and her family understand and desire to proceed. Current Plans  Schedule for Surgery Placement of Port-A-Cath as an outpatient   Signed by Edward Jolly, MD (11/13/2014 11:07 AM)

## 2014-11-26 NOTE — Op Note (Signed)
Preoperative Diagnosis: cancer right breast  Postoprative Diagnosis: cancer right breast  Procedure: Procedure(s): INSERTION PORT-A-CATH   Surgeon: Excell Seltzer T   Assistants: None  Anesthesia:  General LMA anesthesia  Indications: Patient is an 81 year old female with a recent diagnosis of locally advanced triple negative cancer of the right breast. She will require IV access for chemotherapy. We have recommended placement of a Port-A-Cath was discussed with the patient and the family including risks detailed elsewhere and they are in agreement.    Procedure Detail:  Patient was brought to the operating room, placed in the supine position on the operating table, and laryngeal mask general anesthesia induced. PAS were placed. She was given preoperative IV antibiotics. The entire anterior chest and neck were widely sterilely prepped and draped. Patient timeout was performed and correct procedure verified. Initially I attempted cannulation of the left subclavian vein and was able to get blood return but unable to thread the guidewire. Following this the left internal jugular vein was cannulated with a needle and guidewire without difficulty. The guidewire was threaded distally and was able to be passed of the vena cava distal to the diaphragm. It initially preferentially wanted to go into the right subclavian system. Following this the introducer was placed over the guidewire which was removed and the flushed catheter placed via the introducer which was stripped away.  Fluoroscopy showed the catheter was again heading out into the right subclavian but with manipulation was advanced distally to the cavoatrial junction. As the catheter was pulled back any distance it tended to flip up to the right again and I did place it a little bit distally at the cavoatrial junction to prevent this. At this point it appeared in good position and flushed and aspirated easily with normal venous pressure  returned. A site on the left anterior chest wall was chosen and a small transverse incision made. A subtenon's pocket was created and the catheter was tunneled subcutaneously to the pocket, trimmed to length and attached to the port which was sutured into the pocket with 2 interrupted 3-0 Prolene sutures. Fluoroscopy again showed catheter in good position. Incisions were closed with interrupted subcutaneous 4-0 Monocryl in subcuticular 4-0 Monocryl and Dermabond. The port was accessed and aspirated and flushed easily and was left flushed with concentrated heparin solution. Sponge needle and his counts were correct.    Findings: As above  Estimated Blood Loss:  Minimal         Drains: none  Blood Given: none          Specimens: None        Complications:  * No complications entered in OR log *         Disposition: PACU - hemodynamically stable.         Condition: stable

## 2014-11-26 NOTE — Progress Notes (Signed)
Dr. Hoxworth at bedside.  

## 2014-11-26 NOTE — Interval H&P Note (Signed)
History and Physical Interval Note:  11/26/2014 7:04 AM  Brianna Valdez  has presented today for surgery, with the diagnosis of cancer right breast  The various methods of treatment have been discussed with the patient and family. After consideration of risks, benefits and other options for treatment, the patient has consented to  Procedure(s): INSERTION PORT-A-CATH (N/A) as a surgical intervention .  The patient's history has been reviewed, patient examined, no change in status, stable for surgery.  I have reviewed the patient's chart and labs.  Questions were answered to the patient's satisfaction.     Rolan Wrightsman T

## 2014-11-26 NOTE — Transfer of Care (Signed)
Immediate Anesthesia Transfer of Care Note  Patient: Brianna Valdez  Procedure(s) Performed: Procedure(s): INSERTION PORT-A-CATH (Left)  Patient Location: PACU  Anesthesia Type:General  Level of Consciousness: awake, alert  and oriented  Airway & Oxygen Therapy: Patient Spontanous Breathing and Patient connected to nasal cannula oxygen  Post-op Assessment: Report given to RN, Post -op Vital signs reviewed and stable and Patient moving all extremities X 4  Post vital signs: Reviewed and stable  Last Vitals:  Filed Vitals:   11/26/14 0823  BP:   Pulse:   Temp: 36.3 C  Resp:   BP 160/70, HR 81, sats 14% on 2L [place]  Complications: No apparent anesthesia complications

## 2014-11-26 NOTE — Discharge Instructions (Signed)
PORT-A-CATH: POST OP INSTRUCTIONS  Always review your discharge instruction sheet given to you by the facility where your surgery was performed.   1. A prescription for pain medication may be given to you upon discharge. Take your pain medication as prescribed, if needed. If narcotic pain medicine is not needed, then you make take acetaminophen (Tylenol) or ibuprofen (Advil) as needed.  2. Take your usually prescribed medications unless otherwise directed. 3. If you need a refill on your pain medication, please contact our office. All narcotic pain medicine now requires a paper prescription.  Phoned in and fax refills are no longer allowed by law.  Prescriptions will not be filled after 5 pm or on weekends.  4. You should follow a light diet for the remainder of the day after your procedure. 5. Most patients will experience some mild swelling and/or bruising in the area of the incision. It may take several days to resolve. 6. It is common to experience some constipation if taking pain medication after surgery. Increasing fluid intake and taking a stool softener (such as Colace) will usually help or prevent this problem from occurring. A mild laxative (Milk of Magnesia or Miralax) should be taken according to package directions if there are no bowel movements after 48 hours.  7. Unless discharge instructions indicate otherwise, you may remove your bandages 48 hours after surgery, and you may shower at that time. You may have steri-strips (small white skin tapes) in place directly over the incision.  These strips should be left on the skin for 7-10 days.  If your surgeon used Dermabond (skin glue) on the incision, you may shower in 24 hours.  The glue will flake off over the next 2-3 weeks.  8. If your port is left accessed at the end of surgery (needle left in port), the dressing cannot get wet and should only by changed by a healthcare professional. When the port is no longer accessed (when the  needle has been removed), follow step 7.   9. ACTIVITIES:  Limit activity involving your arms for the next 72 hours. Do no strenuous exercise or activity for 1 week. You may drive when you are no longer taking prescription pain medication, you can comfortably wear a seatbelt, and you can maneuver your car. 10.You may need to see your doctor in the office for a follow-up appointment.  Please       check with your doctor.  11.When you receive a new Port-a-Cath, you will get a product guide and        ID card.  Please keep them in case you need them.  WHEN TO CALL YOUR DOCTOR [phone]): 1. Fever over 101.0 2. Chills 3. Continued bleeding from incision 4. Increased redness and tenderness at the site 5. Shortness of breath, difficulty breathing   The clinic staff is available to answer your questions during regular business hours. Please dont hesitate to call and ask to speak to one of the nurses or medical assistants for clinical concerns. If you have a medical emergency, go to the nearest emergency room or call 911.  A surgeon from [facility] is always on call at the hospital.     For further information, please visit [email or web address]    General Anesthesia, Adult, Care After  Refer to this sheet in the next few weeks. These instructions provide you with information on caring for yourself after your procedure. Your health care provider may also give you more specific instructions. Your  treatment has been planned according to current medical practices, but problems sometimes occur. Call your health care provider if you have any problems or questions after your procedure.  WHAT TO EXPECT AFTER THE PROCEDURE  After the procedure, it is typical to experience:  Sleepiness.  Nausea and vomiting. HOME CARE INSTRUCTIONS  For the first 24 hours after general anesthesia:  Have a responsible person with you.  Do not drive a car. If you are alone, do not take public  transportation.  Do not drink alcohol.  Do not take medicine that has not been prescribed by your health care provider.  Do not sign important papers or make important decisions.  You may resume a normal diet and activities as directed by your health care provider.  Change bandages (dressings) as directed.  If you have questions or problems that seem related to general anesthesia, call the hospital and ask for the anesthetist or anesthesiologist on call. SEEK MEDICAL CARE IF:  You have nausea and vomiting that continue the day after anesthesia.  You develop a rash. SEEK IMMEDIATE MEDICAL CARE IF:  You have difficulty breathing.  You have chest pain.  You have any allergic problems. Document Released: 12/27/2000 Document Revised: 05/23/2013 Document Reviewed: 04/05/2013  [facility] Patient Information 2014 [place], [place].

## 2014-11-27 ENCOUNTER — Encounter (HOSPITAL_COMMUNITY): Payer: Self-pay | Admitting: General Surgery

## 2014-11-27 ENCOUNTER — Other Ambulatory Visit: Payer: Self-pay | Admitting: *Deleted

## 2014-11-27 ENCOUNTER — Telehealth: Payer: Self-pay | Admitting: *Deleted

## 2014-11-27 ENCOUNTER — Telehealth: Payer: Self-pay | Admitting: Hematology and Oncology

## 2014-11-27 NOTE — Telephone Encounter (Signed)
Returned patient's daughter's phone call. Daughter states that she is out of town this week and can't make appt this week. Advised daughter that appt was made to discuss different treatment because insurance would not cover the initial treatment. Advised daughter that Dr. Lindi Adie has appealed the insurance company decision and would be contacting her directly. Appt for 2/26 cancelled as patient has appt on 3/4 prior to treatment. Daughter verbalized understanding.

## 2014-11-27 NOTE — Telephone Encounter (Signed)
Brianna Valdez called back to inquire.  This nurse informed her the insurance company needs more information to approve the planned chemotherapy.  The test on Friday will help and Dr. Lindi Adie will discuss this with next visit.   12:00 Call ended and noted appt. For 11-29-2014 and they cannot come in this week.  Dr. Lindi Adie will call patient's daughter to discuss this per collaborative.

## 2014-11-27 NOTE — Telephone Encounter (Signed)
Patient's daughter Brent Bulla returning call from collaborative nurse. "I need to be there with my mother for all appointments and am not able to come in this week.  We don't need to discuss treatment plan.  Dr. Lindi Adie has planned her treatment and we're not interested in insurance."  Call back number [phone].

## 2014-11-27 NOTE — Telephone Encounter (Signed)
lvm for pt regarding to Feb appt °

## 2014-11-28 ENCOUNTER — Encounter (HOSPITAL_COMMUNITY): Payer: Self-pay | Admitting: General Surgery

## 2014-11-29 ENCOUNTER — Ambulatory Visit: Payer: Medicare HMO | Admitting: Hematology and Oncology

## 2014-11-29 ENCOUNTER — Ambulatory Visit (HOSPITAL_COMMUNITY)
Admission: RE | Admit: 2014-11-29 | Discharge: 2014-11-29 | Disposition: A | Discharge status: Home / Self Care | Payer: Medicare HMO | Source: Ambulatory Visit | Attending: Hematology and Oncology | Admitting: Hematology and Oncology

## 2014-11-29 DIAGNOSIS — C50212 Malignant neoplasm of upper-inner quadrant of left female breast: Secondary | ICD-10-CM | POA: Diagnosis present

## 2014-11-29 LAB — GLUCOSE, CAPILLARY: Glucose-Capillary: 169 mg/dL — ABNORMAL HIGH (ref 70–99)

## 2014-11-29 MED ORDER — FLUDEOXYGLUCOSE F - 18 (FDG) INJECTION
8.7000 | Freq: Once | INTRAVENOUS | Status: AC | PRN
  Administered 2014-11-29: 8.7 via INTRAVENOUS

## 2014-12-06 ENCOUNTER — Ambulatory Visit (HOSPITAL_BASED_OUTPATIENT_CLINIC_OR_DEPARTMENT_OTHER): Payer: Medicare HMO | Admitting: Hematology and Oncology

## 2014-12-06 ENCOUNTER — Telehealth: Payer: Self-pay | Admitting: Hematology and Oncology

## 2014-12-06 DIAGNOSIS — C773 Secondary and unspecified malignant neoplasm of axilla and upper limb lymph nodes: Secondary | ICD-10-CM

## 2014-12-06 DIAGNOSIS — C50212 Malignant neoplasm of upper-inner quadrant of left female breast: Secondary | ICD-10-CM

## 2014-12-06 DIAGNOSIS — C50211 Malignant neoplasm of upper-inner quadrant of right female breast: Secondary | ICD-10-CM

## 2014-12-06 MED ORDER — LIDOCAINE-PRILOCAINE 2.5-2.5 % EX CREA: 1.0000 "application " | TOPICAL_CREAM | CUTANEOUS | Status: DC | PRN

## 2014-12-06 NOTE — Telephone Encounter (Signed)
appts made and pt will get a new avs with added labs to tx on 3/8

## 2014-12-06 NOTE — Telephone Encounter (Signed)
per pof to sch pt appt-gave pt copy of sch-pt stated NO CHEMO TODAY

## 2014-12-06 NOTE — Assessment & Plan Note (Signed)
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 ER 0%, PR 0%, HER-2 negative Ki-67 39%, 2.6 cm mass Right axillary lymph node biopsy positive for invasive ductal carcinoma ER 0% PR 0% HER-2 negative Ki-67 43% Clinical stage: T2 N1 M0 stage IIB  Planned treatment: Neoadjuvant chemotherapy with weekly Taxol and carboplatin 12 to start 12/10/2014 Patient understands the treatment plan, she signed the consent form, provided her with all the antiemetic regimen, sent the prescription for EMLA cream  Chemotherapy counseling: patient understands the chemotherapy regimen and all of her questions have been answered. We will see her back 1 week after starting chemotherapy. Patient had a port placement and it appears to be healing well.

## 2014-12-06 NOTE — Progress Notes (Signed)
Patient Care Team: Hulan Fess, MD as PCP - General Excell Seltzer, MD as Consulting Physician (General Surgery) Rulon Eisenmenger, MD as Consulting Physician (Hematology and Oncology) Thea Silversmith, MD as Consulting Physician (Radiation Oncology) Roselee Culver, RN as Registered Nurse [facility], RN as Registered Nurse  DIAGNOSIS: Breast cancer of upper-inner quadrant of left female breast   Staging form: Breast, AJCC 7th Edition     Clinical stage from 11/20/2014: Stage IIB (T2, N1, M0) - Unsigned   SUMMARY OF ONCOLOGIC HISTORY:   Breast cancer of upper-inner quadrant of left female breast   10/22/2014 Mammogram Highly suspicious 2.6 cm mass in the upper inner quadrant of the right breast at the 2 o'clock , Pathologic right axillary lymph nodes, including a node in the mid axillary line with eccentric cortical thickening up to 5 mm   11/01/2014 Initial Biopsy Right breast biopsy 2:00: IDC with DCIS, ER/PR 0%, Ki-67 39%, HER-2 negative; right axillary lymph node biopsy: Positive for IDC ER/PR 0%, 43%, HER-2 Negative Ratio 0.94    CHIEF COMPLIANT: patient here to sign consent form for neoadjuvant chemotherapy  INTERVAL HISTORY: Brianna Valdez is a 81 year old lady with the above-mentioned history of right-sided breast cancer with right axillary lymph node biopsy being positive for triple negative breast cancer. After much discussion we elected to treat her with neoadjuvant chemotherapy with Taxol and carboplatin weekly. She will start her first dose of treatment next week. She underwent port placement and it is healing well. She is here to sign consent form. She is also here to discuss the results of MRI of the breasts in the PET CT scan.  REVIEW OF SYSTEMS:   Constitutional: Denies fevers, chills or abnormal weight loss Eyes: Denies blurriness of vision Ears, nose, mouth, throat, and face: Denies mucositis or sore throat Respiratory: Denies cough, dyspnea or  wheezes Cardiovascular: Denies palpitation, chest discomfort or lower extremity swelling Gastrointestinal:  Denies nausea, heartburn or change in bowel habits Skin: Denies abnormal skin rashes Lymphatics: Denies new lymphadenopathy or easy bruising Neurological:Denies numbness, tingling or new weaknesses Behavioral/Psych: Mood is stable, no new changes  Breast: lump in the breast All other systems were reviewed with the patient and are negative.  I have reviewed the past medical history, past surgical history, social history and family history with the patient and they are unchanged from previous note.  ALLERGIES:  has No Known Allergies.  MEDICATIONS:  Current Outpatient Prescriptions  Medication Sig Dispense Refill  . amLODipine (NORVASC) 2.5 MG tablet Take 2.5 mg by mouth daily.    . brimonidine (ALPHAGAN) 0.15 % ophthalmic solution Place 1 drop into both eyes 2 (two) times daily.    . Ferrous Sulfate (IRON SUPPLEMENT PO) Take by mouth. Liquid med that is OTC    . glimepiride (AMARYL) 1 MG tablet Take 1 mg by mouth daily with breakfast.   6  . HYDROcodone-acetaminophen (NORCO/VICODIN) 5-325 MG per tablet Take 1-2 tablets by mouth every 4 (four) hours as needed for moderate pain or severe pain. 30 tablet 0  . levothyroxine (SYNTHROID, LEVOTHROID) 50 MCG tablet Take 50 mcg by mouth daily before breakfast.    . lidocaine-prilocaine (EMLA) cream Apply 1 application topically as needed. 30 g 6  . lisinopril (PRINIVIL,ZESTRIL) 40 MG tablet Take 40 mg by mouth daily.    Marland Kitchen loteprednol (LOTEMAX) 0.5 % ophthalmic suspension Place 1 drop into both eyes 4 (four) times daily.    . metFORMIN (GLUCOPHAGE) 500 MG tablet Take 500-1,000 mg by  mouth 2 (two) times daily with a meal. Take 1000 mg in the morning and 500 mg in the evening    . Misc Natural Products (OSTEO BI-FLEX JOINT SHIELD PO) Take 1 tablet by mouth daily.    . Multiple Vitamin (MULTIVITAMIN) tablet Take 1 tablet by mouth daily.    .  ondansetron (ZOFRAN) 8 MG tablet Take 1 tablet (8 mg total) by mouth 2 (two) times daily. Start the day after chemo for 3 days. Then take as needed for nausea or vomiting. 30 tablet 1  . OVER THE COUNTER MEDICATION Take 1 tablet by mouth daily. sun chlorello    . OVER THE COUNTER MEDICATION Take 1 tablet by mouth 2 (two) times daily. Multi Nutrition Supplement    . Probiotic Product (PROBIOTIC DAILY PO) Take 1 tablet by mouth daily.    . prochlorperazine (COMPAZINE) 10 MG tablet Take 1 tablet (10 mg total) by mouth every 6 (six) hours as needed (Nausea or vomiting). 30 tablet 1   No current facility-administered medications for this visit.    PHYSICAL EXAMINATION: ECOG PERFORMANCE STATUS: 1 - Symptomatic but completely ambulatory  Filed Vitals:   12/06/14 1226  BP: 120/55  Pulse: 87  Temp: 98.5 F (36.9 C)  Resp: 17   Filed Weights   12/06/14 1226  Weight: 159 lb 9.6 oz (72.394 kg)    GENERAL:alert, no distress and comfortable SKIN: skin color, texture, turgor are normal, no rashes or significant lesions EYES: normal, Conjunctiva are pink and non-injected, sclera clear OROPHARYNX:no exudate, no erythema and lips, buccal mucosa, and tongue normal  NECK: supple, thyroid normal size, non-tender, without nodularity LYMPH:  no palpable lymphadenopathy in the cervical, axillary or inguinal LUNGS: clear to auscultation and percussion with normal breathing effort HEART: regular rate & rhythm and no murmurs and no lower extremity edema ABDOMEN:abdomen soft, non-tender and normal bowel sounds Musculoskeletal:no cyanosis of digits and no clubbing  NEURO: alert & oriented x 3 with fluent speech, no focal motor/sensory deficits  LABORATORY DATA:  I have reviewed the data as listed   Chemistry      Component Value Date/Time   NA 140 11/25/2014 1346   NA 140 11/13/2014 0837   K 4.9 11/25/2014 1346   K 5.4* 11/13/2014 0837   CL 105 11/25/2014 1346   CO2 26 11/25/2014 1346   CO2 28  11/13/2014 0837   BUN 17 11/25/2014 1346   BUN 17.8 11/13/2014 0837   CREATININE 1.02 11/25/2014 1346   CREATININE 1.2* 11/13/2014 0837      Component Value Date/Time   CALCIUM 9.6 11/25/2014 1346   CALCIUM 9.8 11/13/2014 0837   ALKPHOS 88 11/13/2014 0837   ALKPHOS 83 06/26/2012 0200   AST 20 11/13/2014 0837   AST 22 06/26/2012 0200   ALT 13 11/13/2014 0837   ALT 13 06/26/2012 0200   BILITOT 0.29 11/13/2014 0837   BILITOT 0.2* 06/26/2012 0200       Lab Results  Component Value Date   WBC 4.5 11/25/2014   HGB 10.7* 11/25/2014   HCT 33.0* 11/25/2014   MCV 87.8 11/25/2014   PLT 310 11/25/2014   NEUTROABS 2.2 11/13/2014     RADIOGRAPHIC STUDIES: I have personally reviewed the radiology reports and agreed with their findings. Breast MRI and PET/CT scan have been discussed with the patient.  ASSESSMENT & PLAN:  Breast cancer of upper-inner quadrant of left female breast 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 ER 0%, PR 0%, HER-2 negative Ki-67 39%,  2.6 cm mass Right axillary lymph node biopsy positive for invasive ductal carcinoma ER 0% PR 0% HER-2 negative Ki-67 43% Clinical stage: T2 N1 M0 stage IIB  Planned treatment: Neoadjuvant chemotherapy with weekly Taxol and carboplatin 12 to start 12/10/2014 Patient understands the treatment plan, she signed the consent form, provided her with all the antiemetic regimen, sent the prescription for EMLA cream  Chemotherapy counseling: patient understands the chemotherapy regimen and all of her questions have been answered. We will see her back 1 week after starting chemotherapy. Patient had a port placement and it appears to be healing well.  No orders of the defined types were placed in this encounter.   The patient has a good understanding of the overall plan. she agrees with it. She will call with any problems that may develop before her next visit here.   Rulon Eisenmenger, MD

## 2014-12-09 ENCOUNTER — Other Ambulatory Visit: Payer: Self-pay

## 2014-12-09 DIAGNOSIS — C50212 Malignant neoplasm of upper-inner quadrant of left female breast: Secondary | ICD-10-CM

## 2014-12-10 ENCOUNTER — Telehealth: Payer: Self-pay

## 2014-12-10 ENCOUNTER — Encounter: Payer: Self-pay | Admitting: *Deleted

## 2014-12-10 ENCOUNTER — Ambulatory Visit (HOSPITAL_BASED_OUTPATIENT_CLINIC_OR_DEPARTMENT_OTHER): Payer: Medicare HMO

## 2014-12-10 ENCOUNTER — Other Ambulatory Visit (HOSPITAL_BASED_OUTPATIENT_CLINIC_OR_DEPARTMENT_OTHER): Payer: Medicare HMO

## 2014-12-10 DIAGNOSIS — Z5111 Encounter for antineoplastic chemotherapy: Secondary | ICD-10-CM

## 2014-12-10 DIAGNOSIS — Z452 Encounter for adjustment and management of vascular access device: Secondary | ICD-10-CM

## 2014-12-10 DIAGNOSIS — C773 Secondary and unspecified malignant neoplasm of axilla and upper limb lymph nodes: Secondary | ICD-10-CM

## 2014-12-10 DIAGNOSIS — C50212 Malignant neoplasm of upper-inner quadrant of left female breast: Secondary | ICD-10-CM

## 2014-12-10 LAB — CBC WITH DIFFERENTIAL/PLATELET
BASO%: 0.7 % (ref 0.0–2.0)
BASOS ABS: 0 10*3/uL (ref 0.0–0.1)
EOS ABS: 0.1 10*3/uL (ref 0.0–0.5)
EOS%: 1.2 % (ref 0.0–7.0)
HCT: 32.1 % — ABNORMAL LOW (ref 34.8–46.6)
HEMOGLOBIN: 10.1 g/dL — AB (ref 11.6–15.9)
LYMPH#: 1.4 10*3/uL (ref 0.9–3.3)
LYMPH%: 31.5 % (ref 14.0–49.7)
MCH: 28.4 pg (ref 25.1–34.0)
MCHC: 31.6 g/dL (ref 31.5–36.0)
MCV: 90 fL (ref 79.5–101.0)
MONO#: 0.4 10*3/uL (ref 0.1–0.9)
MONO%: 8.6 % (ref 0.0–14.0)
NEUT#: 2.6 10*3/uL (ref 1.5–6.5)
NEUT%: 58 % (ref 38.4–76.8)
Platelets: 314 10*3/uL (ref 145–400)
RBC: 3.56 10*6/uL — ABNORMAL LOW (ref 3.70–5.45)
RDW: 13.9 % (ref 11.2–14.5)
WBC: 4.5 10*3/uL (ref 3.9–10.3)

## 2014-12-10 LAB — COMPREHENSIVE METABOLIC PANEL (CC13)
ALK PHOS: 79 U/L (ref 40–150)
ALT: 8 U/L (ref 0–55)
AST: 17 U/L (ref 5–34)
Albumin: 3.4 g/dL — ABNORMAL LOW (ref 3.5–5.0)
Anion Gap: 9 mEq/L (ref 3–11)
BILIRUBIN TOTAL: 0.25 mg/dL (ref 0.20–1.20)
BUN: 18.9 mg/dL (ref 7.0–26.0)
CO2: 26 meq/L (ref 22–29)
Calcium: 9.2 mg/dL (ref 8.4–10.4)
Chloride: 102 mEq/L (ref 98–109)
Creatinine: 1.1 mg/dL (ref 0.6–1.1)
EGFR: 54 mL/min/{1.73_m2} — ABNORMAL LOW (ref >90)
Glucose: 280 mg/dl — ABNORMAL HIGH (ref 70–140)
Potassium: 4.7 mEq/L (ref 3.5–5.1)
SODIUM: 137 meq/L (ref 136–145)
Total Protein: 6.8 g/dL (ref 6.4–8.3)

## 2014-12-10 MED ORDER — PACLITAXEL CHEMO INJECTION 300 MG/50ML
50.0000 mg/m2 | Freq: Once | INTRAVENOUS | Status: AC
  Administered 2014-12-10: 90 mg via INTRAVENOUS
  Filled 2014-12-10: qty 15

## 2014-12-10 MED ORDER — HEPARIN SOD (PORK) LOCK FLUSH 100 UNIT/ML IV SOLN
500.0000 [IU] | Freq: Once | INTRAVENOUS | Status: AC | PRN
  Administered 2014-12-10: 500 [IU]
  Filled 2014-12-10: qty 5

## 2014-12-10 MED ORDER — SODIUM CHLORIDE 0.9 % IJ SOLN
10.0000 mL | INTRAMUSCULAR | Status: DC | PRN
  Administered 2014-12-10: 10 mL
  Filled 2014-12-10: qty 10

## 2014-12-10 MED ORDER — FAMOTIDINE IN NACL 20-0.9 MG/50ML-% IV SOLN
20.0000 mg | Freq: Once | INTRAVENOUS | Status: AC
  Administered 2014-12-10: 20 mg via INTRAVENOUS

## 2014-12-10 MED ORDER — SODIUM CHLORIDE 0.9 % IV SOLN
141.0000 mg | Freq: Once | INTRAVENOUS | Status: AC
  Administered 2014-12-10: 140 mg via INTRAVENOUS
  Filled 2014-12-10: qty 14

## 2014-12-10 MED ORDER — DIPHENHYDRAMINE HCL 50 MG/ML IJ SOLN
INTRAMUSCULAR | Status: AC
  Filled 2014-12-10: qty 1

## 2014-12-10 MED ORDER — ALTEPLASE 2 MG IJ SOLR
2.0000 mg | Freq: Once | INTRAMUSCULAR | Status: AC | PRN
  Administered 2014-12-10: 2 mg
  Filled 2014-12-10: qty 2

## 2014-12-10 MED ORDER — SODIUM CHLORIDE 0.9 % IV SOLN
Freq: Once | INTRAVENOUS | Status: AC
  Administered 2014-12-10: 13:00:00 via INTRAVENOUS

## 2014-12-10 MED ORDER — FAMOTIDINE IN NACL 20-0.9 MG/50ML-% IV SOLN
INTRAVENOUS | Status: AC
  Filled 2014-12-10: qty 50

## 2014-12-10 MED ORDER — SODIUM CHLORIDE 0.9 % IV SOLN
Freq: Once | INTRAVENOUS | Status: AC
  Administered 2014-12-10: 15:00:00 via INTRAVENOUS
  Filled 2014-12-10: qty 8

## 2014-12-10 MED ORDER — DIPHENHYDRAMINE HCL 50 MG/ML IJ SOLN
25.0000 mg | Freq: Once | INTRAMUSCULAR | Status: AC
  Administered 2014-12-10: 25 mg via INTRAVENOUS

## 2014-12-10 NOTE — Progress Notes (Signed)
VSS, tolerating Taxol at Maximum rate well.

## 2014-12-10 NOTE — Progress Notes (Signed)
Met with pt during 1st chemotherapy treatment in infusion room. Pt denies needs at this time. Encourage pt to call with questions or concerns. Received verbal understanding.

## 2014-12-10 NOTE — Telephone Encounter (Signed)
Per below

## 2014-12-10 NOTE — Patient Instructions (Signed)
[facility] Discharge Instructions for Patients Receiving Chemotherapy  Today you received the following chemotherapy agents Taxol, carboplatin.  To help prevent nausea and vomiting after your treatment, we encourage you to take your nausea medication Compazine 10 mg and zofran 8 mg Take 1 tablet (8 mg total) by mouth 2 (two) times daily. Start the day after chemo for 3 days. Then take as needed for nausea or vomiting.   If you develop nausea and vomiting that is not controlled by your nausea medication, call the clinic.   BELOW ARE SYMPTOMS THAT SHOULD BE REPORTED IMMEDIATELY:  *FEVER GREATER THAN 100.5 F  *CHILLS WITH OR WITHOUT FEVER  NAUSEA AND VOMITING THAT IS NOT CONTROLLED WITH YOUR NAUSEA MEDICATION  *UNUSUAL SHORTNESS OF BREATH  *UNUSUAL BRUISING OR BLEEDING  TENDERNESS IN MOUTH AND THROAT WITH OR WITHOUT PRESENCE OF ULCERS  *URINARY PROBLEMS  *BOWEL PROBLEMS  UNUSUAL RASH Items with * indicate a potential emergency and should be followed up as soon as possible.  Feel free to call the clinic you have any questions or concerns. The clinic phone number is [phone].

## 2014-12-10 NOTE — Progress Notes (Signed)
Patient denies receiving flu vaccine.  Starting chemotherapy treatment today and is not indicated at this time. Port-a-cath accessed.  Flushes easily with no resistance or blood return.  Patient has changed position and coughed.  TPA ordered per protocol. Alteplase placed at 1255.  1325 checked and no blood return noted.   1410 positive blood return after repositioning, deep breaths and coughs.   1630 patient reports F/U visit with Dr. Hulan Fess next week.  Asked if she should keep this appointment and asked if lab results could be sent.  Routed per patients request to PCP.  Reviewed handwashing and avoiding people who are sick so she will wear a mask next week to see Dr. Rex Kras.

## 2014-12-10 NOTE — Telephone Encounter (Signed)
-----   Message from Joellyn Haff sent at 11/26/2014  8:48 AM EST ----- Regarding: RE: 1st chemo 3/8 Auth is pending.  I believe they req peer to peer or addlt info from Dr. Lindi Adie.  Email was fowarded to him yesterday.  ----- Message -----    From: Prentiss Bells, RN    Sent: 11/22/2014   3:53 PM      To: Lenise A White Subject: 1st chemo 3/8                                  Taxol, carboplatin

## 2014-12-12 ENCOUNTER — Telehealth: Payer: Self-pay | Admitting: *Deleted

## 2014-12-12 NOTE — Telephone Encounter (Signed)
Pt called to say her AVS from 12/11/14 stated diagnosis of "cancer in left breast" and that her PET scan indicated left breast, not right breast. Pt states her cancer is in the right breast. Dr Geralyn Flash nurse notified and she will discuss with Dr Lindi Adie to get this corrected.

## 2014-12-12 NOTE — Telephone Encounter (Signed)
Called patient for chemo follow up. Pt states she did well with chemo. Denies N/V or diarrhea. Tolerating food and fluids well

## 2014-12-12 NOTE — Telephone Encounter (Signed)
-----   Message from Cherylynn Ridges, RN sent at 12/10/2014  4:40 PM EST ----- Regarding: Chemotherapy F/U Contact: [phone] Dr. Lindi Adie 1st taxol/Carbo

## 2014-12-13 ENCOUNTER — Telehealth: Payer: Self-pay

## 2014-12-13 NOTE — Telephone Encounter (Signed)
-----   Message from Sherryl Barters, MD sent at 12/13/2014  8:05 AM EST ----- Regarding: RE: PET scan - incorrect diagnosis code Fixed, thanks for letting me know.    ----- Message -----    From: Prentiss Bells, RN    Sent: 12/12/2014  12:41 PM      To: Sherryl Barters, MD, Nicholas Lose, MD Subject: PET scan - incorrect diagnosis code            Dr. Janeece Fitting,   A PET was performed on this patient on 11/29/14.  Prior to the PET, a  diagnosis code was incorrectly entered for left breast cancer.  I have updated the chart to the correct code.  The notes for the PET must have utilized the incorrect diagnosis as the notes reference left breast.    The patient is requesting her chart be corrected.  Would you be able to update the PET notes to reflect the corrected diagnosis?  The patient is asking me to send her updated, corrected copies.  Thank you so much.  Jonelle Sports, RN

## 2014-12-17 ENCOUNTER — Ambulatory Visit (HOSPITAL_BASED_OUTPATIENT_CLINIC_OR_DEPARTMENT_OTHER): Payer: Medicare HMO

## 2014-12-17 ENCOUNTER — Encounter: Payer: Self-pay | Admitting: *Deleted

## 2014-12-17 ENCOUNTER — Other Ambulatory Visit (HOSPITAL_BASED_OUTPATIENT_CLINIC_OR_DEPARTMENT_OTHER): Payer: Medicare HMO

## 2014-12-17 ENCOUNTER — Telehealth: Payer: Self-pay | Admitting: Hematology and Oncology

## 2014-12-17 ENCOUNTER — Ambulatory Visit (HOSPITAL_BASED_OUTPATIENT_CLINIC_OR_DEPARTMENT_OTHER): Payer: Medicare HMO | Admitting: Hematology and Oncology

## 2014-12-17 VITALS — BP 132/50 | HR 84 | Temp 98.4°F | Resp 18 | Ht 62.0 in | Wt 157.2 lb

## 2014-12-17 DIAGNOSIS — C50212 Malignant neoplasm of upper-inner quadrant of left female breast: Secondary | ICD-10-CM

## 2014-12-17 DIAGNOSIS — C773 Secondary and unspecified malignant neoplasm of axilla and upper limb lymph nodes: Secondary | ICD-10-CM

## 2014-12-17 DIAGNOSIS — C50211 Malignant neoplasm of upper-inner quadrant of right female breast: Secondary | ICD-10-CM

## 2014-12-17 DIAGNOSIS — Z171 Estrogen receptor negative status [ER-]: Secondary | ICD-10-CM

## 2014-12-17 DIAGNOSIS — Z5111 Encounter for antineoplastic chemotherapy: Secondary | ICD-10-CM

## 2014-12-17 LAB — CBC WITH DIFFERENTIAL/PLATELET
BASO%: 0.3 % (ref 0.0–2.0)
Basophils Absolute: 0 10*3/uL (ref 0.0–0.1)
EOS%: 1.3 % (ref 0.0–7.0)
Eosinophils Absolute: 0.1 10*3/uL (ref 0.0–0.5)
HEMATOCRIT: 31.8 % — AB (ref 34.8–46.6)
HEMOGLOBIN: 10.2 g/dL — AB (ref 11.6–15.9)
LYMPH#: 1.6 10*3/uL (ref 0.9–3.3)
LYMPH%: 39.6 % (ref 14.0–49.7)
MCH: 28.8 pg (ref 25.1–34.0)
MCHC: 32.1 g/dL (ref 31.5–36.0)
MCV: 89.8 fL (ref 79.5–101.0)
MONO#: 0.3 10*3/uL (ref 0.1–0.9)
MONO%: 6.9 % (ref 0.0–14.0)
NEUT#: 2.1 10*3/uL (ref 1.5–6.5)
NEUT%: 51.9 % (ref 38.4–76.8)
Platelets: 296 10*3/uL (ref 145–400)
RBC: 3.54 10*6/uL — ABNORMAL LOW (ref 3.70–5.45)
RDW: 13.2 % (ref 11.2–14.5)
WBC: 3.9 10*3/uL (ref 3.9–10.3)

## 2014-12-17 LAB — COMPREHENSIVE METABOLIC PANEL (CC13)
ALK PHOS: 72 U/L (ref 40–150)
ALT: 14 U/L (ref 0–55)
AST: 19 U/L (ref 5–34)
Albumin: 3.4 g/dL — ABNORMAL LOW (ref 3.5–5.0)
Anion Gap: 10 mEq/L (ref 3–11)
BUN: 16.3 mg/dL (ref 7.0–26.0)
CO2: 25 mEq/L (ref 22–29)
CREATININE: 1.1 mg/dL (ref 0.6–1.1)
Calcium: 9.3 mg/dL (ref 8.4–10.4)
Chloride: 103 mEq/L (ref 98–109)
EGFR: 53 mL/min/{1.73_m2} — AB (ref >90)
Glucose: 235 mg/dl — ABNORMAL HIGH (ref 70–140)
Potassium: 5.3 mEq/L — ABNORMAL HIGH (ref 3.5–5.1)
Sodium: 139 mEq/L (ref 136–145)
Total Bilirubin: 0.2 mg/dL (ref 0.20–1.20)
Total Protein: 6.8 g/dL (ref 6.4–8.3)

## 2014-12-17 MED ORDER — SODIUM CHLORIDE 0.9 % IV SOLN
141.0000 mg | Freq: Once | INTRAVENOUS | Status: AC
  Administered 2014-12-17: 140 mg via INTRAVENOUS
  Filled 2014-12-17: qty 14

## 2014-12-17 MED ORDER — SODIUM CHLORIDE 0.9 % IJ SOLN
10.0000 mL | INTRAMUSCULAR | Status: DC | PRN
  Administered 2014-12-17: 10 mL
  Filled 2014-12-17: qty 10

## 2014-12-17 MED ORDER — SODIUM CHLORIDE 0.9 % IV SOLN
Freq: Once | INTRAVENOUS | Status: AC
  Administered 2014-12-17: 13:00:00 via INTRAVENOUS

## 2014-12-17 MED ORDER — DIPHENHYDRAMINE HCL 50 MG/ML IJ SOLN
25.0000 mg | Freq: Once | INTRAMUSCULAR | Status: AC
  Administered 2014-12-17: 25 mg via INTRAVENOUS

## 2014-12-17 MED ORDER — HEPARIN SOD (PORK) LOCK FLUSH 100 UNIT/ML IV SOLN
500.0000 [IU] | Freq: Once | INTRAVENOUS | Status: AC | PRN
  Administered 2014-12-17: 500 [IU]
  Filled 2014-12-17: qty 5

## 2014-12-17 MED ORDER — DIPHENHYDRAMINE HCL 50 MG/ML IJ SOLN
INTRAMUSCULAR | Status: AC
  Filled 2014-12-17: qty 1

## 2014-12-17 MED ORDER — SODIUM CHLORIDE 0.9 % IV SOLN
Freq: Once | INTRAVENOUS | Status: AC
  Administered 2014-12-17: 13:00:00 via INTRAVENOUS
  Filled 2014-12-17: qty 8

## 2014-12-17 MED ORDER — FAMOTIDINE IN NACL 20-0.9 MG/50ML-% IV SOLN
20.0000 mg | Freq: Once | INTRAVENOUS | Status: AC
  Administered 2014-12-17: 20 mg via INTRAVENOUS

## 2014-12-17 MED ORDER — PACLITAXEL CHEMO INJECTION 300 MG/50ML
50.0000 mg/m2 | Freq: Once | INTRAVENOUS | Status: AC
  Administered 2014-12-17: 90 mg via INTRAVENOUS
  Filled 2014-12-17: qty 15

## 2014-12-17 MED ORDER — FAMOTIDINE IN NACL 20-0.9 MG/50ML-% IV SOLN
INTRAVENOUS | Status: AC
  Filled 2014-12-17: qty 50

## 2014-12-17 NOTE — Assessment & Plan Note (Signed)
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 ER 0%, PR 0%, HER-2 negative Ki-67 39%, 2.6 cm mass Right axillary lymph node biopsy positive for invasive ductal carcinoma ER 0% PR 0% HER-2 negative Ki-67 43% Clinical stage: T2 N1 M0 stage IIB  Current treatment: Neoadjuvant chemotherapy with weekly Taxol and carboplatin 12 to start 12/10/2014; today is week 2/12  Chemotherapy toxicities: Patient did not experience any side effects to chemotherapy. Mild constipation which improved with stool softeners. Patient is extremely happy with the staff at chemotherapy infusion. Did not have any nausea vomiting or neuropathy.  Monitoring closely for toxicities. I reviewed her blood counts and that adequate for treatment. Patient has chronic normocytic anemia which has been even present prior to starting chemotherapy.  Return to clinic weekly for chemotherapy and once every 2 weeks for follow-up with me.

## 2014-12-17 NOTE — Progress Notes (Signed)
Patient Care Team: Hulan Fess, MD as PCP - General Excell Seltzer, MD as Consulting Physician (General Surgery) Nicholas Lose, MD as Consulting Physician (Hematology and Oncology) Thea Silversmith, MD as Consulting Physician (Radiation Oncology) Rockwell Germany, RN as Registered Nurse Mauro Kaufmann, RN as Registered Nurse  DIAGNOSIS: Breast cancer of upper-inner quadrant of right female breast   Staging form: Breast, AJCC 7th Edition     Clinical stage from 11/20/2014: Stage IIB (T2, N1, M0) - Unsigned   SUMMARY OF ONCOLOGIC HISTORY:   Breast cancer of upper-inner quadrant of right female breast   10/22/2014 Mammogram Highly suspicious 2.6 cm mass in the upper inner quadrant of the right breast at the 2 o'clock , Pathologic right axillary lymph nodes, including a node in the mid axillary line with eccentric cortical thickening up to 5 mm   11/01/2014 Initial Biopsy Right breast biopsy 2:00: IDC with DCIS, ER/PR 0%, Ki-67 39%, HER-2 negative; right axillary lymph node biopsy: Positive for IDC ER/PR 0%, 43%, HER-2 Negative Ratio 0.94   12/10/2014 -  Neo-Adjuvant Chemotherapy Weekly Taxol and carboplatin 12    CHIEF COMPLIANT: Week 2/12 Taxol and carboplatin  INTERVAL HISTORY: Brianna Valdez is a 81 year old lady who is on neoadjuvant chemotherapy with weekly Taxol and carboplatin. Today is week #2. She tolerated week 1 extremely well without any major problems. She said she was extremely happy with the way the treatment went. She did not experience any nausea vomiting. She did have mild constipation which resolved with stool softeners.  REVIEW OF SYSTEMS:   Constitutional: Denies fevers, chills or abnormal weight loss Eyes: Denies blurriness of vision Ears, nose, mouth, throat, and face: Denies mucositis or sore throat Respiratory: Denies cough, dyspnea or wheezes Cardiovascular: Denies palpitation, chest discomfort or lower extremity swelling Gastrointestinal:  Denies nausea,  heartburn or change in bowel habits Skin: Denies abnormal skin rashes Lymphatics: Denies new lymphadenopathy or easy bruising Neurological:Denies numbness, tingling or new weaknesses Behavioral/Psych: Mood is stable, no new changes  Breast:  denies any pain or lumps or nodules in either breasts All other systems were reviewed with the patient and are negative.  I have reviewed the past medical history, past surgical history, social history and family history with the patient and they are unchanged from previous note.  ALLERGIES:  has No Known Allergies.  MEDICATIONS:  Current Outpatient Prescriptions  Medication Sig Dispense Refill  . amLODipine (NORVASC) 2.5 MG tablet Take 2.5 mg by mouth daily.    . brimonidine (ALPHAGAN) 0.15 % ophthalmic solution Place 1 drop into both eyes 2 (two) times daily.    . Ferrous Sulfate (IRON SUPPLEMENT PO) Take by mouth. Liquid med that is OTC    . glimepiride (AMARYL) 1 MG tablet Take 1 mg by mouth daily with breakfast.   6  . HYDROcodone-acetaminophen (NORCO/VICODIN) 5-325 MG per tablet Take 1-2 tablets by mouth every 4 (four) hours as needed for moderate pain or severe pain. 30 tablet 0  . levothyroxine (SYNTHROID, LEVOTHROID) 50 MCG tablet Take 50 mcg by mouth daily before breakfast.    . lidocaine-prilocaine (EMLA) cream Apply 1 application topically as needed. 30 g 6  . lisinopril (PRINIVIL,ZESTRIL) 40 MG tablet Take 40 mg by mouth daily.    Marland Kitchen loteprednol (LOTEMAX) 0.5 % ophthalmic suspension Place 1 drop into both eyes 4 (four) times daily.    . metFORMIN (GLUCOPHAGE) 500 MG tablet Take 500-1,000 mg by mouth 2 (two) times daily with a meal. Take 1000 mg in the morning  and 500 mg in the evening    . Misc Natural Products (OSTEO BI-FLEX JOINT SHIELD PO) Take 1 tablet by mouth daily.    . Multiple Vitamin (MULTIVITAMIN) tablet Take 1 tablet by mouth daily.    . ondansetron (ZOFRAN) 8 MG tablet Take 1 tablet (8 mg total) by mouth 2 (two) times daily.  Start the day after chemo for 3 days. Then take as needed for nausea or vomiting. 30 tablet 1  . OVER THE COUNTER MEDICATION Take 1 tablet by mouth daily. sun chlorello    . OVER THE COUNTER MEDICATION Take 1 tablet by mouth 2 (two) times daily. Multi Nutrition Supplement    . Probiotic Product (PROBIOTIC DAILY PO) Take 1 tablet by mouth daily.    . prochlorperazine (COMPAZINE) 10 MG tablet Take 1 tablet (10 mg total) by mouth every 6 (six) hours as needed (Nausea or vomiting). 30 tablet 1   No current facility-administered medications for this visit.    PHYSICAL EXAMINATION: ECOG PERFORMANCE STATUS: 0 - Asymptomatic  Filed Vitals:   12/17/14 1120  BP: 132/50  Pulse: 84  Temp: 98.4 F (36.9 C)  Resp: 18   Filed Weights   12/17/14 1120  Weight: 157 lb 3.2 oz (71.305 kg)    GENERAL:alert, no distress and comfortable SKIN: skin color, texture, turgor are normal, no rashes or significant lesions EYES: normal, Conjunctiva are pink and non-injected, sclera clear OROPHARYNX:no exudate, no erythema and lips, buccal mucosa, and tongue normal  NECK: supple, thyroid normal size, non-tender, without nodularity LYMPH:  no palpable lymphadenopathy in the cervical, axillary or inguinal LUNGS: clear to auscultation and percussion with normal breathing effort HEART: regular rate & rhythm and no murmurs and no lower extremity edema ABDOMEN:abdomen soft, non-tender and normal bowel sounds Musculoskeletal:no cyanosis of digits and no clubbing  NEURO: alert & oriented x 3 with fluent speech, no focal motor/sensory deficits  LABORATORY DATA:  I have reviewed the data as listed   Chemistry      Component Value Date/Time   NA 139 12/17/2014 1101   NA 140 11/25/2014 1346   K 5.3* 12/17/2014 1101   K 4.9 11/25/2014 1346   CL 105 11/25/2014 1346   CO2 25 12/17/2014 1101   CO2 26 11/25/2014 1346   BUN 16.3 12/17/2014 1101   BUN 17 11/25/2014 1346   CREATININE 1.1 12/17/2014 1101   CREATININE  1.02 11/25/2014 1346      Component Value Date/Time   CALCIUM 9.3 12/17/2014 1101   CALCIUM 9.6 11/25/2014 1346   ALKPHOS 72 12/17/2014 1101   ALKPHOS 83 06/26/2012 0200   AST 19 12/17/2014 1101   AST 22 06/26/2012 0200   ALT 14 12/17/2014 1101   ALT 13 06/26/2012 0200   BILITOT 0.20 12/17/2014 1101   BILITOT 0.2* 06/26/2012 0200       Lab Results  Component Value Date   WBC 3.9 12/17/2014   HGB 10.2* 12/17/2014   HCT 31.8* 12/17/2014   MCV 89.8 12/17/2014   PLT 296 12/17/2014   NEUTROABS 2.1 12/17/2014    ASSESSMENT & PLAN:  Breast cancer of upper-inner quadrant of right female breast 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 ER 0%, PR 0%, HER-2 negative Ki-67 39%, 2.6 cm mass Right axillary lymph node biopsy positive for invasive ductal carcinoma ER 0% PR 0% HER-2 negative Ki-67 43% Clinical stage: T2 N1 M0 stage IIB  Current treatment: Neoadjuvant chemotherapy with weekly Taxol and carboplatin 12 to start 12/10/2014; today is  week 2/12  Chemotherapy toxicities: Patient did not experience any side effects to chemotherapy. Mild constipation which improved with stool softeners. Patient is extremely happy with the staff at chemotherapy infusion. Did not have any nausea vomiting or neuropathy.  Monitoring closely for toxicities. I reviewed her blood counts and that adequate for treatment. Patient has chronic normocytic anemia which has been even present prior to starting chemotherapy.  Return to clinic weekly for chemotherapy and once every 2 weeks for follow-up with me.  No orders of the defined types were placed in this encounter.   The patient has a good understanding of the overall plan. she agrees with it. She will call with any problems that may develop before her next visit here.   Rulon Eisenmenger, MD

## 2014-12-17 NOTE — Patient Instructions (Signed)
[facility] Discharge Instructions for Patients Receiving Chemotherapy  Today you received the following chemotherapy agents Taxol/Carboplatin To help prevent nausea and vomiting after your treatment, we encourage you to take your nausea medication as prescribed.  If you develop nausea and vomiting that is not controlled by your nausea medication, call the clinic.   BELOW ARE SYMPTOMS THAT SHOULD BE REPORTED IMMEDIATELY:  *FEVER GREATER THAN 100.5 F  *CHILLS WITH OR WITHOUT FEVER  NAUSEA AND VOMITING THAT IS NOT CONTROLLED WITH YOUR NAUSEA MEDICATION  *UNUSUAL SHORTNESS OF BREATH  *UNUSUAL BRUISING OR BLEEDING  TENDERNESS IN MOUTH AND THROAT WITH OR WITHOUT PRESENCE OF ULCERS  *URINARY PROBLEMS  *BOWEL PROBLEMS  UNUSUAL RASH Items with * indicate a potential emergency and should be followed up as soon as possible.  Feel free to call the clinic you have any questions or concerns. The clinic phone number is [phone].    

## 2014-12-17 NOTE — Telephone Encounter (Signed)
Gave pt avs report and appts for march thru April.

## 2014-12-17 NOTE — Progress Notes (Signed)
Met with pt during cycle 2. Pt relate she is doing well. Denies needs at this time. Relayed she tolerated cycle one without problems. Encourage pt call with needs.

## 2014-12-24 ENCOUNTER — Ambulatory Visit (HOSPITAL_BASED_OUTPATIENT_CLINIC_OR_DEPARTMENT_OTHER): Payer: Medicare HMO

## 2014-12-24 ENCOUNTER — Other Ambulatory Visit: Payer: Self-pay

## 2014-12-24 ENCOUNTER — Other Ambulatory Visit (HOSPITAL_BASED_OUTPATIENT_CLINIC_OR_DEPARTMENT_OTHER): Payer: Medicare HMO

## 2014-12-24 DIAGNOSIS — C50211 Malignant neoplasm of upper-inner quadrant of right female breast: Secondary | ICD-10-CM | POA: Diagnosis not present

## 2014-12-24 DIAGNOSIS — Z5111 Encounter for antineoplastic chemotherapy: Secondary | ICD-10-CM

## 2014-12-24 DIAGNOSIS — C50212 Malignant neoplasm of upper-inner quadrant of left female breast: Secondary | ICD-10-CM

## 2014-12-24 DIAGNOSIS — C773 Secondary and unspecified malignant neoplasm of axilla and upper limb lymph nodes: Secondary | ICD-10-CM

## 2014-12-24 LAB — CBC WITH DIFFERENTIAL/PLATELET
BASO%: 0.5 % (ref 0.0–2.0)
Basophils Absolute: 0 10*3/uL (ref 0.0–0.1)
EOS ABS: 0 10*3/uL (ref 0.0–0.5)
EOS%: 0.8 % (ref 0.0–7.0)
HCT: 32 % — ABNORMAL LOW (ref 34.8–46.6)
HGB: 10.3 g/dL — ABNORMAL LOW (ref 11.6–15.9)
LYMPH%: 39.8 % (ref 14.0–49.7)
MCH: 28.7 pg (ref 25.1–34.0)
MCHC: 32.2 g/dL (ref 31.5–36.0)
MCV: 89.1 fL (ref 79.5–101.0)
MONO#: 0.3 10*3/uL (ref 0.1–0.9)
MONO%: 9 % (ref 0.0–14.0)
NEUT#: 1.9 10*3/uL (ref 1.5–6.5)
NEUT%: 49.9 % (ref 38.4–76.8)
PLATELETS: 309 10*3/uL (ref 145–400)
RBC: 3.59 10*6/uL — AB (ref 3.70–5.45)
RDW: 13.5 % (ref 11.2–14.5)
WBC: 3.8 10*3/uL — ABNORMAL LOW (ref 3.9–10.3)
lymph#: 1.5 10*3/uL (ref 0.9–3.3)

## 2014-12-24 LAB — COMPREHENSIVE METABOLIC PANEL (CC13)
ALT: 13 U/L (ref 0–55)
AST: 14 U/L (ref 5–34)
Albumin: 3.4 g/dL — ABNORMAL LOW (ref 3.5–5.0)
Alkaline Phosphatase: 68 U/L (ref 40–150)
Anion Gap: 9 mEq/L (ref 3–11)
BILIRUBIN TOTAL: 0.22 mg/dL (ref 0.20–1.20)
BUN: 18.4 mg/dL (ref 7.0–26.0)
CO2: 26 meq/L (ref 22–29)
CREATININE: 1.1 mg/dL (ref 0.6–1.1)
Calcium: 9.5 mg/dL (ref 8.4–10.4)
Chloride: 104 mEq/L (ref 98–109)
EGFR: 55 mL/min/{1.73_m2} — AB (ref >90)
Glucose: 172 mg/dl — ABNORMAL HIGH (ref 70–140)
Potassium: 5.7 mEq/L — ABNORMAL HIGH (ref 3.5–5.1)
Sodium: 139 mEq/L (ref 136–145)
Total Protein: 6.7 g/dL (ref 6.4–8.3)

## 2014-12-24 LAB — POTASSIUM (CC13): Potassium: 4.4 mEq/L (ref 3.5–5.1)

## 2014-12-24 MED ORDER — PACLITAXEL CHEMO INJECTION 300 MG/50ML
50.0000 mg/m2 | Freq: Once | INTRAVENOUS | Status: AC
  Administered 2014-12-24: 90 mg via INTRAVENOUS
  Filled 2014-12-24: qty 15

## 2014-12-24 MED ORDER — SODIUM CHLORIDE 0.9 % IV SOLN
Freq: Once | INTRAVENOUS | Status: AC
  Administered 2014-12-24: 11:00:00 via INTRAVENOUS

## 2014-12-24 MED ORDER — HEPARIN SOD (PORK) LOCK FLUSH 100 UNIT/ML IV SOLN
500.0000 [IU] | Freq: Once | INTRAVENOUS | Status: AC | PRN
  Administered 2014-12-24: 500 [IU]
  Filled 2014-12-24: qty 5

## 2014-12-24 MED ORDER — DIPHENHYDRAMINE HCL 50 MG/ML IJ SOLN
INTRAMUSCULAR | Status: AC
  Filled 2014-12-24: qty 1

## 2014-12-24 MED ORDER — SODIUM CHLORIDE 0.9 % IV SOLN
Freq: Once | INTRAVENOUS | Status: AC
  Administered 2014-12-24: 11:00:00 via INTRAVENOUS
  Filled 2014-12-24: qty 8

## 2014-12-24 MED ORDER — SODIUM CHLORIDE 0.9 % IV SOLN
141.0000 mg | Freq: Once | INTRAVENOUS | Status: AC
  Administered 2014-12-24: 140 mg via INTRAVENOUS
  Filled 2014-12-24: qty 14

## 2014-12-24 MED ORDER — DIPHENHYDRAMINE HCL 50 MG/ML IJ SOLN
25.0000 mg | Freq: Once | INTRAMUSCULAR | Status: AC
  Administered 2014-12-24: 25 mg via INTRAVENOUS

## 2014-12-24 MED ORDER — SODIUM CHLORIDE 0.9 % IJ SOLN
10.0000 mL | INTRAMUSCULAR | Status: DC | PRN
  Administered 2014-12-24: 10 mL
  Filled 2014-12-24: qty 10

## 2014-12-24 MED ORDER — FAMOTIDINE IN NACL 20-0.9 MG/50ML-% IV SOLN
20.0000 mg | Freq: Once | INTRAVENOUS | Status: AC
  Administered 2014-12-24: 20 mg via INTRAVENOUS

## 2014-12-24 MED ORDER — FAMOTIDINE IN NACL 20-0.9 MG/50ML-% IV SOLN
INTRAVENOUS | Status: AC
  Filled 2014-12-24: qty 50

## 2014-12-24 NOTE — Patient Instructions (Signed)
[facility] Discharge Instructions for Patients Receiving Chemotherapy  Today you received the following chemotherapy agents Paclitaxel/Carboplatin.   To help prevent nausea and vomiting after your treatment, we encourage you to take your nausea medication as directed.    If you develop nausea and vomiting that is not controlled by your nausea medication, call the clinic.   BELOW ARE SYMPTOMS THAT SHOULD BE REPORTED IMMEDIATELY:  *FEVER GREATER THAN 100.5 F  *CHILLS WITH OR WITHOUT FEVER  NAUSEA AND VOMITING THAT IS NOT CONTROLLED WITH YOUR NAUSEA MEDICATION  *UNUSUAL SHORTNESS OF BREATH  *UNUSUAL BRUISING OR BLEEDING  TENDERNESS IN MOUTH AND THROAT WITH OR WITHOUT PRESENCE OF ULCERS  *URINARY PROBLEMS  *BOWEL PROBLEMS  UNUSUAL RASH Items with * indicate a potential emergency and should be followed up as soon as possible.  Feel free to call the clinic you have any questions or concerns. The clinic phone number is [phone].  Please show the CHEMO ALERT CARD at check-in to the Emergency Department and triage nurse.   

## 2014-12-31 ENCOUNTER — Other Ambulatory Visit (HOSPITAL_BASED_OUTPATIENT_CLINIC_OR_DEPARTMENT_OTHER): Payer: Medicare HMO

## 2014-12-31 ENCOUNTER — Ambulatory Visit (HOSPITAL_BASED_OUTPATIENT_CLINIC_OR_DEPARTMENT_OTHER): Payer: Medicare HMO

## 2014-12-31 ENCOUNTER — Telehealth: Payer: Self-pay | Admitting: Hematology and Oncology

## 2014-12-31 ENCOUNTER — Ambulatory Visit (HOSPITAL_BASED_OUTPATIENT_CLINIC_OR_DEPARTMENT_OTHER): Payer: Medicare HMO | Admitting: Hematology and Oncology

## 2014-12-31 VITALS — BP 112/53 | HR 75 | Temp 97.5°F | Resp 18 | Ht 62.0 in | Wt 155.4 lb

## 2014-12-31 DIAGNOSIS — C50211 Malignant neoplasm of upper-inner quadrant of right female breast: Secondary | ICD-10-CM

## 2014-12-31 DIAGNOSIS — C773 Secondary and unspecified malignant neoplasm of axilla and upper limb lymph nodes: Secondary | ICD-10-CM

## 2014-12-31 DIAGNOSIS — C50212 Malignant neoplasm of upper-inner quadrant of left female breast: Secondary | ICD-10-CM

## 2014-12-31 DIAGNOSIS — Z5111 Encounter for antineoplastic chemotherapy: Secondary | ICD-10-CM

## 2014-12-31 LAB — COMPREHENSIVE METABOLIC PANEL (CC13)
ALBUMIN: 3.5 g/dL (ref 3.5–5.0)
ALT: 12 U/L (ref 0–55)
ANION GAP: 10 meq/L (ref 3–11)
AST: 18 U/L (ref 5–34)
Alkaline Phosphatase: 70 U/L (ref 40–150)
BUN: 16.7 mg/dL (ref 7.0–26.0)
CALCIUM: 9.4 mg/dL (ref 8.4–10.4)
CHLORIDE: 104 meq/L (ref 98–109)
CO2: 26 mEq/L (ref 22–29)
Creatinine: 1 mg/dL (ref 0.6–1.1)
EGFR: 61 mL/min/{1.73_m2} — ABNORMAL LOW (ref >90)
GLUCOSE: 166 mg/dL — AB (ref 70–140)
POTASSIUM: 5.4 meq/L — AB (ref 3.5–5.1)
Sodium: 140 mEq/L (ref 136–145)
Total Bilirubin: 0.25 mg/dL (ref 0.20–1.20)
Total Protein: 6.9 g/dL (ref 6.4–8.3)

## 2014-12-31 LAB — CBC WITH DIFFERENTIAL/PLATELET
BASO%: 1.5 % (ref 0.0–2.0)
Basophils Absolute: 0 10*3/uL (ref 0.0–0.1)
EOS ABS: 0 10*3/uL (ref 0.0–0.5)
EOS%: 0.8 % (ref 0.0–7.0)
HEMATOCRIT: 31.7 % — AB (ref 34.8–46.6)
HGB: 9.9 g/dL — ABNORMAL LOW (ref 11.6–15.9)
LYMPH%: 41.6 % (ref 14.0–49.7)
MCH: 28.1 pg (ref 25.1–34.0)
MCHC: 31.4 g/dL — ABNORMAL LOW (ref 31.5–36.0)
MCV: 89.6 fL (ref 79.5–101.0)
MONO#: 0.3 10*3/uL (ref 0.1–0.9)
MONO%: 9.4 % (ref 0.0–14.0)
NEUT#: 1.5 10*3/uL (ref 1.5–6.5)
NEUT%: 46.7 % (ref 38.4–76.8)
Platelets: 301 10*3/uL (ref 145–400)
RBC: 3.54 10*6/uL — ABNORMAL LOW (ref 3.70–5.45)
RDW: 13.7 % (ref 11.2–14.5)
WBC: 3.2 10*3/uL — AB (ref 3.9–10.3)
lymph#: 1.3 10*3/uL (ref 0.9–3.3)

## 2014-12-31 MED ORDER — FAMOTIDINE IN NACL 20-0.9 MG/50ML-% IV SOLN
20.0000 mg | Freq: Once | INTRAVENOUS | Status: AC
  Administered 2014-12-31: 20 mg via INTRAVENOUS

## 2014-12-31 MED ORDER — SODIUM CHLORIDE 0.9 % IV SOLN
Freq: Once | INTRAVENOUS | Status: AC
  Administered 2014-12-31: 14:00:00 via INTRAVENOUS

## 2014-12-31 MED ORDER — SODIUM CHLORIDE 0.9 % IV SOLN
140.0000 mg | Freq: Once | INTRAVENOUS | Status: AC
  Administered 2014-12-31: 140 mg via INTRAVENOUS
  Filled 2014-12-31: qty 14

## 2014-12-31 MED ORDER — CARBOPLATIN CHEMO INJECTION 450 MG/45ML: 150.2000 mg | Freq: Once | INTRAVENOUS | Status: DC

## 2014-12-31 MED ORDER — DIPHENHYDRAMINE HCL 50 MG/ML IJ SOLN
INTRAMUSCULAR | Status: AC
Start: 2014-12-31 — End: 2014-12-31
  Filled 2014-12-31: qty 1

## 2014-12-31 MED ORDER — FAMOTIDINE IN NACL 20-0.9 MG/50ML-% IV SOLN
INTRAVENOUS | Status: AC
  Filled 2014-12-31: qty 50

## 2014-12-31 MED ORDER — SODIUM CHLORIDE 0.9 % IV SOLN
Freq: Once | INTRAVENOUS | Status: AC
  Administered 2014-12-31: 15:00:00 via INTRAVENOUS
  Filled 2014-12-31: qty 8

## 2014-12-31 MED ORDER — SODIUM CHLORIDE 0.9 % IJ SOLN
10.0000 mL | INTRAMUSCULAR | Status: DC | PRN
  Administered 2014-12-31: 10 mL
  Filled 2014-12-31: qty 10

## 2014-12-31 MED ORDER — HEPARIN SOD (PORK) LOCK FLUSH 100 UNIT/ML IV SOLN
500.0000 [IU] | Freq: Once | INTRAVENOUS | Status: AC | PRN
  Administered 2014-12-31: 500 [IU]
  Filled 2014-12-31: qty 5

## 2014-12-31 MED ORDER — DIPHENHYDRAMINE HCL 50 MG/ML IJ SOLN
25.0000 mg | Freq: Once | INTRAMUSCULAR | Status: AC
  Administered 2014-12-31: 25 mg via INTRAVENOUS

## 2014-12-31 MED ORDER — PACLITAXEL CHEMO INJECTION 300 MG/50ML
45.0000 mg/m2 | Freq: Once | INTRAVENOUS | Status: AC
  Administered 2014-12-31: 78 mg via INTRAVENOUS
  Filled 2014-12-31: qty 13

## 2014-12-31 NOTE — Patient Instructions (Signed)
[facility] Discharge Instructions for Patients Receiving Chemotherapy  Today you received the following chemotherapy agents Taxol and Carboplatin.  To help prevent nausea and vomiting after your treatment, we encourage you to take your nausea medication as prescribed.   If you develop nausea and vomiting that is not controlled by your nausea medication, call the clinic.   BELOW ARE SYMPTOMS THAT SHOULD BE REPORTED IMMEDIATELY:  *FEVER GREATER THAN 100.5 F  *CHILLS WITH OR WITHOUT FEVER  NAUSEA AND VOMITING THAT IS NOT CONTROLLED WITH YOUR NAUSEA MEDICATION  *UNUSUAL SHORTNESS OF BREATH  *UNUSUAL BRUISING OR BLEEDING  TENDERNESS IN MOUTH AND THROAT WITH OR WITHOUT PRESENCE OF ULCERS  *URINARY PROBLEMS  *BOWEL PROBLEMS  UNUSUAL RASH Items with * indicate a potential emergency and should be followed up as soon as possible.  Feel free to call the clinic you have any questions or concerns. The clinic phone number is [phone].  Please show the CHEMO ALERT CARD at check-in to the Emergency Department and triage nurse.   

## 2014-12-31 NOTE — Assessment & Plan Note (Signed)
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 Mild constipation which improved with stool softeners. Patient is extremely happy with the staff at chemotherapy infusion. Did not have any nausea vomiting or neuropathy.  Monitoring closely for toxicities. I reviewed her blood counts and that adequate for treatment.  Patient has chronic normocytic anemia which has been even present prior to starting chemotherapy.  Return to clinic weekly for chemotherapy and once every 2 weeks for follow-up with me.

## 2014-12-31 NOTE — Telephone Encounter (Signed)
Appointments made and avs printed for patient   Brianna Valdez

## 2014-12-31 NOTE — Progress Notes (Signed)
Patient Care Team: Hulan Fess, MD as PCP - General Excell Seltzer, MD as Consulting Physician (General Surgery) Nicholas Lose, MD as Consulting Physician (Hematology and Oncology) Thea Silversmith, MD as Consulting Physician (Radiation Oncology) Rockwell Germany, RN as Registered Nurse Mauro Kaufmann, RN as Registered Nurse  DIAGNOSIS: Breast cancer of upper-inner quadrant of right female breast   Staging form: Breast, AJCC 7th Edition     Clinical stage from 11/20/2014: Stage IIB (T2, N1, M0) - Unsigned   SUMMARY OF ONCOLOGIC HISTORY:   Breast cancer of upper-inner quadrant of right female breast   10/22/2014 Mammogram Highly suspicious 2.6 cm mass in the upper inner quadrant of the right breast at the 2 o'clock , Pathologic right axillary lymph nodes, including a node in the mid axillary line with eccentric cortical thickening up to 5 mm   11/01/2014 Initial Biopsy Right breast biopsy 2:00: IDC with DCIS, ER/PR 0%, Ki-67 39%, HER-2 negative; right axillary lymph node biopsy: Positive for IDC ER/PR 0%, 43%, HER-2 Negative Ratio 0.94   12/10/2014 -  Neo-Adjuvant Chemotherapy Weekly Taxol and carboplatin 12    CHIEF COMPLIANT: Weak 4/12 Taxol carboplatin  INTERVAL HISTORY: Brianna Valdez is a 81 year old lady with above-mentioned history of triple negative right breast cancer currently on neoadjuvant chemotherapy. Today is week 4 of treatment. She has done extremely well with very minimal side effects. Denies any nausea or vomiting denies any neuropathy. She has good energy levels. She feels that the chemotherapy was not affecting her. She does have an occasional sour taste in the mouth. But her appetite and eating have been relatively stable.   REVIEW OF SYSTEMS:   Constitutional: Denies fevers, chills or abnormal weight loss Eyes: Denies blurriness of vision Ears, nose, mouth, throat, and face: Denies mucositis or sore throat Respiratory: Denies cough, dyspnea or  wheezes Cardiovascular: Denies palpitation, chest discomfort or lower extremity swelling Gastrointestinal:  Denies nausea, heartburn or change in bowel habits Skin: Denies abnormal skin rashes Lymphatics: Denies new lymphadenopathy or easy bruising Neurological:Denies numbness, tingling or new weaknesses Behavioral/Psych: Mood is stable, no new changes  All other systems were reviewed with the patient and are negative.  I have reviewed the past medical history, past surgical history, social history and family history with the patient and they are unchanged from previous note.  ALLERGIES:  has No Known Allergies.  MEDICATIONS:  Current Outpatient Prescriptions  Medication Sig Dispense Refill  . amLODipine (NORVASC) 2.5 MG tablet Take 2.5 mg by mouth daily.    . brimonidine (ALPHAGAN) 0.15 % ophthalmic solution Place 1 drop into both eyes 2 (two) times daily.    . Ferrous Sulfate (IRON SUPPLEMENT PO) Take by mouth. Liquid med that is OTC    . glimepiride (AMARYL) 1 MG tablet Take 1 mg by mouth daily with breakfast.   6  . HYDROcodone-acetaminophen (NORCO/VICODIN) 5-325 MG per tablet Take 1-2 tablets by mouth every 4 (four) hours as needed for moderate pain or severe pain. 30 tablet 0  . levothyroxine (SYNTHROID, LEVOTHROID) 50 MCG tablet Take 50 mcg by mouth daily before breakfast.    . lidocaine-prilocaine (EMLA) cream Apply 1 application topically as needed. 30 g 6  . lisinopril (PRINIVIL,ZESTRIL) 40 MG tablet Take 40 mg by mouth daily.    . metFORMIN (GLUCOPHAGE) 500 MG tablet Take 500-1,000 mg by mouth 2 (two) times daily with a meal. Take 1000 mg in the morning and 500 mg in the evening    . Misc Natural Products (OSTEO BI-FLEX  JOINT SHIELD PO) Take 1 tablet by mouth daily.    . Multiple Vitamin (MULTIVITAMIN) tablet Take 1 tablet by mouth daily.    Marland Kitchen OVER THE COUNTER MEDICATION Take 1 tablet by mouth daily. sun chlorello    . OVER THE COUNTER MEDICATION Take 1 tablet by mouth 2 (two)  times daily. Multi Nutrition Supplement    . Probiotic Product (PROBIOTIC DAILY PO) Take 1 tablet by mouth daily.    Marland Kitchen loteprednol (LOTEMAX) 0.5 % ophthalmic suspension Place 1 drop into both eyes 4 (four) times daily.    . ondansetron (ZOFRAN) 8 MG tablet Take 1 tablet (8 mg total) by mouth 2 (two) times daily. Start the day after chemo for 3 days. Then take as needed for nausea or vomiting. (Patient not taking: Reported on 12/31/2014) 30 tablet 1  . prochlorperazine (COMPAZINE) 10 MG tablet Take 1 tablet (10 mg total) by mouth every 6 (six) hours as needed (Nausea or vomiting). (Patient not taking: Reported on 12/31/2014) 30 tablet 1   No current facility-administered medications for this visit.    PHYSICAL EXAMINATION: ECOG PERFORMANCE STATUS: 1 - Symptomatic but completely ambulatory  Filed Vitals:   12/31/14 1304  BP: 112/53  Pulse: 75  Temp: 97.5 F (36.4 C)  Resp: 18   Filed Weights   12/31/14 1304  Weight: 155 lb 6.4 oz (70.489 kg)    GENERAL:alert, no distress and comfortable SKIN: skin color, texture, turgor are normal, no rashes or significant lesions EYES: normal, Conjunctiva are pink and non-injected, sclera clear OROPHARYNX:no exudate, no erythema and lips, buccal mucosa, and tongue normal  NECK: supple, thyroid normal size, non-tender, without nodularity LYMPH:  no palpable lymphadenopathy in the cervical, axillary or inguinal LUNGS: clear to auscultation and percussion with normal breathing effort HEART: regular rate & rhythm and no murmurs and no lower extremity edema ABDOMEN:abdomen soft, non-tender and normal bowel sounds Musculoskeletal:no cyanosis of digits and no clubbing  NEURO: alert & oriented x 3 with fluent speech, no focal motor/sensory deficits  LABORATORY DATA:  I have reviewed the data as listed   Chemistry      Component Value Date/Time   NA 139 12/24/2014 1041   NA 140 11/25/2014 1346   K 4.4 12/24/2014 1208   K 4.9 11/25/2014 1346   CL 105  11/25/2014 1346   CO2 26 12/24/2014 1041   CO2 26 11/25/2014 1346   BUN 18.4 12/24/2014 1041   BUN 17 11/25/2014 1346   CREATININE 1.1 12/24/2014 1041   CREATININE 1.02 11/25/2014 1346      Component Value Date/Time   CALCIUM 9.5 12/24/2014 1041   CALCIUM 9.6 11/25/2014 1346   ALKPHOS 68 12/24/2014 1041   ALKPHOS 83 06/26/2012 0200   AST 14 12/24/2014 1041   AST 22 06/26/2012 0200   ALT 13 12/24/2014 1041   ALT 13 06/26/2012 0200   BILITOT 0.22 12/24/2014 1041   BILITOT 0.2* 06/26/2012 0200       Lab Results  Component Value Date   WBC 3.2* 12/31/2014   HGB 9.9* 12/31/2014   HCT 31.7* 12/31/2014   MCV 89.6 12/31/2014   PLT 301 12/31/2014   NEUTROABS 1.5 12/31/2014   ASSESSMENT & PLAN:  Breast cancer of upper-inner quadrant of right female breast 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 ER 0%, PR 0%, HER-2 negative Ki-67 39%, 2.6 cm mass Right axillary lymph node biopsy positive for invasive ductal carcinoma ER 0% PR 0% HER-2 negative Ki-67 43% Clinical stage: T2 N1  M0 stage IIB  Current treatment: Neoadjuvant chemotherapy with weekly Taxol and carboplatin 12 started 12/10/2014; today is week 4/12  Chemotherapy toxicities: Patient did not experience any side effects to chemotherapy. Mild constipation which improved with stool softeners. Patient is extremely happy with the staff at chemotherapy infusion. Did not have any nausea vomiting or neuropathy.  Monitoring closely for toxicities. I reviewed her blood counts and that adequate for treatment.  Her ANC and hemoglobin have slowly trickled. ANC is 1.5. I would like to reduce the dosage of Taxol to 45 mg/m.  Patient has chronic normocytic anemia which has been even present prior to starting chemotherapy.  Return to clinic weekly for chemotherapy and once every 2 weeks for follow-up with me.    No orders of the defined types were placed in this encounter.   The patient has a good understanding of the overall  plan. she agrees with it. She will call with any problems that may develop before her next visit here.   Rulon Eisenmenger, MD

## 2015-01-01 ENCOUNTER — Other Ambulatory Visit: Payer: Self-pay | Admitting: *Deleted

## 2015-01-07 ENCOUNTER — Other Ambulatory Visit (HOSPITAL_BASED_OUTPATIENT_CLINIC_OR_DEPARTMENT_OTHER): Payer: Medicare HMO

## 2015-01-07 ENCOUNTER — Ambulatory Visit (HOSPITAL_BASED_OUTPATIENT_CLINIC_OR_DEPARTMENT_OTHER): Payer: Medicare HMO

## 2015-01-07 DIAGNOSIS — C50211 Malignant neoplasm of upper-inner quadrant of right female breast: Secondary | ICD-10-CM

## 2015-01-07 DIAGNOSIS — C773 Secondary and unspecified malignant neoplasm of axilla and upper limb lymph nodes: Secondary | ICD-10-CM | POA: Diagnosis not present

## 2015-01-07 DIAGNOSIS — C50212 Malignant neoplasm of upper-inner quadrant of left female breast: Secondary | ICD-10-CM

## 2015-01-07 DIAGNOSIS — Z5111 Encounter for antineoplastic chemotherapy: Secondary | ICD-10-CM | POA: Diagnosis not present

## 2015-01-07 LAB — CBC WITH DIFFERENTIAL/PLATELET
BASO%: 0.3 % (ref 0.0–2.0)
Basophils Absolute: 0 10*3/uL (ref 0.0–0.1)
EOS%: 0.8 % (ref 0.0–7.0)
Eosinophils Absolute: 0 10*3/uL (ref 0.0–0.5)
HCT: 31.3 % — ABNORMAL LOW (ref 34.8–46.6)
HGB: 10 g/dL — ABNORMAL LOW (ref 11.6–15.9)
LYMPH%: 43.6 % (ref 14.0–49.7)
MCH: 28.7 pg (ref 25.1–34.0)
MCHC: 31.9 g/dL (ref 31.5–36.0)
MCV: 89.7 fL (ref 79.5–101.0)
MONO#: 0.3 10*3/uL (ref 0.1–0.9)
MONO%: 8.3 % (ref 0.0–14.0)
NEUT#: 1.7 10*3/uL (ref 1.5–6.5)
NEUT%: 47 % (ref 38.4–76.8)
PLATELETS: 284 10*3/uL (ref 145–400)
RBC: 3.49 10*6/uL — ABNORMAL LOW (ref 3.70–5.45)
RDW: 14.3 % (ref 11.2–14.5)
WBC: 3.6 10*3/uL — ABNORMAL LOW (ref 3.9–10.3)
lymph#: 1.6 10*3/uL (ref 0.9–3.3)

## 2015-01-07 LAB — COMPREHENSIVE METABOLIC PANEL (CC13)
ALBUMIN: 3.5 g/dL (ref 3.5–5.0)
ALK PHOS: 75 U/L (ref 40–150)
ALT: 11 U/L (ref 0–55)
ANION GAP: 9 meq/L (ref 3–11)
AST: 17 U/L (ref 5–34)
BUN: 17 mg/dL (ref 7.0–26.0)
CO2: 26 meq/L (ref 22–29)
Calcium: 9.4 mg/dL (ref 8.4–10.4)
Chloride: 104 mEq/L (ref 98–109)
Creatinine: 1.1 mg/dL (ref 0.6–1.1)
EGFR: 57 mL/min/{1.73_m2} — AB (ref >90)
GLUCOSE: 142 mg/dL — AB (ref 70–140)
POTASSIUM: 5.3 meq/L — AB (ref 3.5–5.1)
SODIUM: 140 meq/L (ref 136–145)
TOTAL PROTEIN: 6.9 g/dL (ref 6.4–8.3)
Total Bilirubin: 0.21 mg/dL (ref 0.20–1.20)

## 2015-01-07 MED ORDER — SODIUM CHLORIDE 0.9 % IV SOLN
Freq: Once | INTRAVENOUS | Status: AC
  Administered 2015-01-07: 11:00:00 via INTRAVENOUS
  Filled 2015-01-07: qty 8

## 2015-01-07 MED ORDER — FAMOTIDINE IN NACL 20-0.9 MG/50ML-% IV SOLN
20.0000 mg | Freq: Once | INTRAVENOUS | Status: AC
  Administered 2015-01-07: 20 mg via INTRAVENOUS

## 2015-01-07 MED ORDER — FAMOTIDINE IN NACL 20-0.9 MG/50ML-% IV SOLN
INTRAVENOUS | Status: AC
  Filled 2015-01-07: qty 50

## 2015-01-07 MED ORDER — SODIUM CHLORIDE 0.9 % IJ SOLN
10.0000 mL | INTRAMUSCULAR | Status: DC | PRN
  Administered 2015-01-07: 10 mL
  Filled 2015-01-07: qty 10

## 2015-01-07 MED ORDER — PACLITAXEL CHEMO INJECTION 300 MG/50ML
45.0000 mg/m2 | Freq: Once | INTRAVENOUS | Status: AC
  Administered 2015-01-07: 78 mg via INTRAVENOUS
  Filled 2015-01-07: qty 13

## 2015-01-07 MED ORDER — HEPARIN SOD (PORK) LOCK FLUSH 100 UNIT/ML IV SOLN
500.0000 [IU] | Freq: Once | INTRAVENOUS | Status: AC | PRN
  Administered 2015-01-07: 500 [IU]
  Filled 2015-01-07: qty 5

## 2015-01-07 MED ORDER — SODIUM CHLORIDE 0.9 % IV SOLN
141.0000 mg | Freq: Once | INTRAVENOUS | Status: AC
  Administered 2015-01-07: 140 mg via INTRAVENOUS
  Filled 2015-01-07: qty 14

## 2015-01-07 MED ORDER — DIPHENHYDRAMINE HCL 50 MG/ML IJ SOLN
25.0000 mg | Freq: Once | INTRAMUSCULAR | Status: AC
  Administered 2015-01-07: 25 mg via INTRAVENOUS

## 2015-01-07 MED ORDER — SODIUM CHLORIDE 0.9 % IV SOLN
Freq: Once | INTRAVENOUS | Status: AC
  Administered 2015-01-07: 11:00:00 via INTRAVENOUS

## 2015-01-07 MED ORDER — DIPHENHYDRAMINE HCL 50 MG/ML IJ SOLN
INTRAMUSCULAR | Status: AC
  Filled 2015-01-07: qty 1

## 2015-01-07 NOTE — Patient Instructions (Signed)
Carboplatin injection  What is this medicine?  CARBOPLATIN (KAR boe pla tin) is a chemotherapy drug. It targets fast dividing cells, like cancer cells, and causes these cells to die. This medicine is used to treat ovarian cancer and many other cancers.  This medicine may be used for other purposes; ask your health care provider or pharmacist if you have questions.  COMMON BRAND NAME(S): Paraplatin  What should I tell my health care provider before I take this medicine?  They need to know if you have any of these conditions:  -blood disorders  -hearing problems  -kidney disease  -recent or ongoing radiation therapy  -an unusual or allergic reaction to carboplatin, cisplatin, other chemotherapy, other medicines, foods, dyes, or preservatives  -pregnant or trying to get pregnant  -breast-feeding  How should I use this medicine?  This drug is usually given as an infusion into a vein. It is administered in a hospital or clinic by a specially trained health care professional.  Talk to your pediatrician regarding the use of this medicine in children. Special care may be needed.  Overdosage: If you think you have taken too much of this medicine contact a poison control center or emergency room at once.  NOTE: This medicine is only for you. Do not share this medicine with others.  What if I miss a dose?  It is important not to miss a dose. Call your doctor or health care professional if you are unable to keep an appointment.  What may interact with this medicine?  -medicines for seizures  -medicines to increase blood counts like filgrastim, pegfilgrastim, sargramostim  -some antibiotics like amikacin, gentamicin, neomycin, streptomycin, tobramycin  -vaccines  Talk to your doctor or health care professional before taking any of these medicines:  -acetaminophen  -aspirin  -ibuprofen  -ketoprofen  -naproxen  This list may not describe all possible interactions. Give your health care provider a list of all the medicines, herbs,  non-prescription drugs, or dietary supplements you use. Also tell them if you smoke, drink alcohol, or use illegal drugs. Some items may interact with your medicine.  What should I watch for while using this medicine?  Your condition will be monitored carefully while you are receiving this medicine. You will need important blood work done while you are taking this medicine.  This drug may make you feel generally unwell. This is not uncommon, as chemotherapy can affect healthy cells as well as cancer cells. Report any side effects. Continue your course of treatment even though you feel ill unless your doctor tells you to stop.  In some cases, you may be given additional medicines to help with side effects. Follow all directions for their use.  Call your doctor or health care professional for advice if you get a fever, chills or sore throat, or other symptoms of a cold or flu. Do not treat yourself. This drug decreases your body's ability to fight infections. Try to avoid being around people who are sick.  This medicine may increase your risk to bruise or bleed. Call your doctor or health care professional if you notice any unusual bleeding.  Be careful brushing and flossing your teeth or using a toothpick because you may get an infection or bleed more easily. If you have any dental work done, tell your dentist you are receiving this medicine.  Avoid taking products that contain aspirin, acetaminophen, ibuprofen, naproxen, or ketoprofen unless instructed by your doctor. These medicines may hide a fever.  Do not become   pregnant while taking this medicine. Women should inform their doctor if they wish to become pregnant or think they might be pregnant. There is a potential for serious side effects to an unborn child. Talk to your health care professional or pharmacist for more information. Do not breast-feed an infant while taking this medicine.  What side effects may I notice from receiving this medicine?  Side effects  that you should report to your doctor or health care professional as soon as possible:  -allergic reactions like skin rash, itching or hives, swelling of the face, lips, or tongue  -signs of infection - fever or chills, cough, sore throat, pain or difficulty passing urine  -signs of decreased platelets or bleeding - bruising, pinpoint red spots on the skin, black, tarry stools, nosebleeds  -signs of decreased red blood cells - unusually weak or tired, fainting spells, lightheadedness  -breathing problems  -changes in hearing  -changes in vision  -chest pain  -high blood pressure  -low blood counts - This drug may decrease the number of white blood cells, red blood cells and platelets. You may be at increased risk for infections and bleeding.  -nausea and vomiting  -pain, swelling, redness or irritation at the injection site  -pain, tingling, numbness in the hands or feet  -problems with balance, talking, walking  -trouble passing urine or change in the amount of urine  Side effects that usually do not require medical attention (report to your doctor or health care professional if they continue or are bothersome):  -hair loss  -loss of appetite  -metallic taste in the mouth or changes in taste  This list may not describe all possible side effects. Call your doctor for medical advice about side effects. You may report side effects to FDA at 1-800-FDA-1088.  Where should I keep my medicine?  This drug is given in a hospital or clinic and will not be stored at home.  NOTE: This sheet is a summary. It may not cover all possible information. If you have questions about this medicine, talk to your doctor, pharmacist, or health care provider.   2015, Elsevier/Gold Standard. (2007-12-26 14:38:05)  Paclitaxel injection  What is this medicine?  PACLITAXEL (PAK li TAX el) is a chemotherapy drug. It targets fast dividing cells, like cancer cells, and causes these cells to die. This medicine is used to treat ovarian cancer,  breast cancer, and other cancers.  This medicine may be used for other purposes; ask your health care provider or pharmacist if you have questions.  COMMON BRAND NAME(S): Onxol, Taxol  What should I tell my health care provider before I take this medicine?  They need to know if you have any of these conditions:  -blood disorders  -irregular heartbeat  -infection (especially a virus infection such as chickenpox, cold sores, or herpes)  -liver disease  -previous or ongoing radiation therapy  -an unusual or allergic reaction to paclitaxel, alcohol, polyoxyethylated castor oil, other chemotherapy agents, other medicines, foods, dyes, or preservatives  -pregnant or trying to get pregnant  -breast-feeding  How should I use this medicine?  This drug is given as an infusion into a vein. It is administered in a hospital or clinic by a specially trained health care professional.  Talk to your pediatrician regarding the use of this medicine in children. Special care may be needed.  Overdosage: If you think you have taken too much of this medicine contact a poison control center or emergency room at once.  NOTE:   This medicine is only for you. Do not share this medicine with others.  What if I miss a dose?  It is important not to miss your dose. Call your doctor or health care professional if you are unable to keep an appointment.  What may interact with this medicine?  Do not take this medicine with any of the following medications:  -disulfiram  -metronidazole  This medicine may also interact with the following medications:  -cyclosporine  -diazepam  -ketoconazole  -medicines to increase blood counts like filgrastim, pegfilgrastim, sargramostim  -other chemotherapy drugs like cisplatin, doxorubicin, epirubicin, etoposide, teniposide, vincristine  -quinidine  -testosterone  -vaccines  -verapamil  Talk to your doctor or health care professional before taking any of these  medicines:  -acetaminophen  -aspirin  -ibuprofen  -ketoprofen  -naproxen  This list may not describe all possible interactions. Give your health care provider a list of all the medicines, herbs, non-prescription drugs, or dietary supplements you use. Also tell them if you smoke, drink alcohol, or use illegal drugs. Some items may interact with your medicine.  What should I watch for while using this medicine?  Your condition will be monitored carefully while you are receiving this medicine. You will need important blood work done while you are taking this medicine.  This drug may make you feel generally unwell. This is not uncommon, as chemotherapy can affect healthy cells as well as cancer cells. Report any side effects. Continue your course of treatment even though you feel ill unless your doctor tells you to stop.  In some cases, you may be given additional medicines to help with side effects. Follow all directions for their use.  Call your doctor or health care professional for advice if you get a fever, chills or sore throat, or other symptoms of a cold or flu. Do not treat yourself. This drug decreases your body's ability to fight infections. Try to avoid being around people who are sick.  This medicine may increase your risk to bruise or bleed. Call your doctor or health care professional if you notice any unusual bleeding.  Be careful brushing and flossing your teeth or using a toothpick because you may get an infection or bleed more easily. If you have any dental work done, tell your dentist you are receiving this medicine.  Avoid taking products that contain aspirin, acetaminophen, ibuprofen, naproxen, or ketoprofen unless instructed by your doctor. These medicines may hide a fever.  Do not become pregnant while taking this medicine. Women should inform their doctor if they wish to become pregnant or think they might be pregnant. There is a potential for serious side effects to an unborn child. Talk to  your health care professional or pharmacist for more information. Do not breast-feed an infant while taking this medicine.  Men are advised not to father a child while receiving this medicine.  What side effects may I notice from receiving this medicine?  Side effects that you should report to your doctor or health care professional as soon as possible:  -allergic reactions like skin rash, itching or hives, swelling of the face, lips, or tongue  -low blood counts - This drug may decrease the number of white blood cells, red blood cells and platelets. You may be at increased risk for infections and bleeding.  -signs of infection - fever or chills, cough, sore throat, pain or difficulty passing urine  -signs of decreased platelets or bleeding - bruising, pinpoint red spots on the skin, black, tarry   stools, nosebleeds  -signs of decreased red blood cells - unusually weak or tired, fainting spells, lightheadedness  -breathing problems  -chest pain  -high or low blood pressure  -mouth sores  -nausea and vomiting  -pain, swelling, redness or irritation at the injection site  -pain, tingling, numbness in the hands or feet  -slow or irregular heartbeat  -swelling of the ankle, feet, hands  Side effects that usually do not require medical attention (report to your doctor or health care professional if they continue or are bothersome):  -bone pain  -complete hair loss including hair on your head, underarms, pubic hair, eyebrows, and eyelashes  -changes in the color of fingernails  -diarrhea  -loosening of the fingernails  -loss of appetite  -muscle or joint pain  -red flush to skin  -sweating  This list may not describe all possible side effects. Call your doctor for medical advice about side effects. You may report side effects to FDA at 1-800-FDA-1088.  Where should I keep my medicine?  This drug is given in a hospital or clinic and will not be stored at home.  NOTE: This sheet is a summary. It may not cover all possible  information. If you have questions about this medicine, talk to your doctor, pharmacist, or health care provider.   2015, [employer]/Gold Standard. (2012-11-13 16:41:21)

## 2015-01-14 ENCOUNTER — Other Ambulatory Visit: Payer: Medicare HMO

## 2015-01-14 ENCOUNTER — Encounter: Payer: Self-pay | Admitting: *Deleted

## 2015-01-14 ENCOUNTER — Ambulatory Visit (HOSPITAL_BASED_OUTPATIENT_CLINIC_OR_DEPARTMENT_OTHER): Payer: Medicare HMO | Admitting: Hematology and Oncology

## 2015-01-14 ENCOUNTER — Telehealth: Payer: Self-pay | Admitting: *Deleted

## 2015-01-14 ENCOUNTER — Telehealth: Payer: Self-pay | Admitting: Hematology and Oncology

## 2015-01-14 ENCOUNTER — Ambulatory Visit (HOSPITAL_BASED_OUTPATIENT_CLINIC_OR_DEPARTMENT_OTHER): Payer: Medicare HMO

## 2015-01-14 ENCOUNTER — Other Ambulatory Visit (HOSPITAL_BASED_OUTPATIENT_CLINIC_OR_DEPARTMENT_OTHER): Payer: Medicare HMO

## 2015-01-14 DIAGNOSIS — D649 Anemia, unspecified: Secondary | ICD-10-CM | POA: Diagnosis not present

## 2015-01-14 DIAGNOSIS — C50211 Malignant neoplasm of upper-inner quadrant of right female breast: Secondary | ICD-10-CM

## 2015-01-14 DIAGNOSIS — C50212 Malignant neoplasm of upper-inner quadrant of left female breast: Secondary | ICD-10-CM

## 2015-01-14 DIAGNOSIS — Z5111 Encounter for antineoplastic chemotherapy: Secondary | ICD-10-CM | POA: Diagnosis not present

## 2015-01-14 DIAGNOSIS — C773 Secondary and unspecified malignant neoplasm of axilla and upper limb lymph nodes: Secondary | ICD-10-CM

## 2015-01-14 LAB — CBC WITH DIFFERENTIAL/PLATELET
BASO%: 0.7 % (ref 0.0–2.0)
BASOS ABS: 0 10*3/uL (ref 0.0–0.1)
EOS%: 1 % (ref 0.0–7.0)
Eosinophils Absolute: 0 10*3/uL (ref 0.0–0.5)
HEMATOCRIT: 30.9 % — AB (ref 34.8–46.6)
HEMOGLOBIN: 10 g/dL — AB (ref 11.6–15.9)
LYMPH%: 50.7 % — ABNORMAL HIGH (ref 14.0–49.7)
MCH: 29.2 pg (ref 25.1–34.0)
MCHC: 32.4 g/dL (ref 31.5–36.0)
MCV: 90.1 fL (ref 79.5–101.0)
MONO#: 0.3 10*3/uL (ref 0.1–0.9)
MONO%: 10.4 % (ref 0.0–14.0)
NEUT#: 1.1 10*3/uL — ABNORMAL LOW (ref 1.5–6.5)
NEUT%: 37.2 % — AB (ref 38.4–76.8)
Platelets: 246 10*3/uL (ref 145–400)
RBC: 3.43 10*6/uL — ABNORMAL LOW (ref 3.70–5.45)
RDW: 14.7 % — ABNORMAL HIGH (ref 11.2–14.5)
WBC: 2.9 10*3/uL — ABNORMAL LOW (ref 3.9–10.3)
lymph#: 1.5 10*3/uL (ref 0.9–3.3)

## 2015-01-14 LAB — COMPREHENSIVE METABOLIC PANEL (CC13)
ALT: 11 U/L (ref 0–55)
ANION GAP: 7 meq/L (ref 3–11)
AST: 17 U/L (ref 5–34)
Albumin: 3.5 g/dL (ref 3.5–5.0)
Alkaline Phosphatase: 69 U/L (ref 40–150)
BUN: 17.6 mg/dL (ref 7.0–26.0)
CHLORIDE: 104 meq/L (ref 98–109)
CO2: 29 meq/L (ref 22–29)
CREATININE: 1 mg/dL (ref 0.6–1.1)
Calcium: 9 mg/dL (ref 8.4–10.4)
EGFR: 64 mL/min/{1.73_m2} — ABNORMAL LOW (ref >90)
Glucose: 113 mg/dl (ref 70–140)
Potassium: 4.4 mEq/L (ref 3.5–5.1)
Sodium: 140 mEq/L (ref 136–145)
Total Bilirubin: 0.29 mg/dL (ref 0.20–1.20)
Total Protein: 6.8 g/dL (ref 6.4–8.3)

## 2015-01-14 MED ORDER — SODIUM CHLORIDE 0.9 % IV SOLN
Freq: Once | INTRAVENOUS | Status: AC
  Administered 2015-01-14: 12:00:00 via INTRAVENOUS
  Filled 2015-01-14: qty 8

## 2015-01-14 MED ORDER — HEPARIN SOD (PORK) LOCK FLUSH 100 UNIT/ML IV SOLN
500.0000 [IU] | Freq: Once | INTRAVENOUS | Status: AC | PRN
  Administered 2015-01-14: 500 [IU]
  Filled 2015-01-14: qty 5

## 2015-01-14 MED ORDER — SODIUM CHLORIDE 0.9 % IJ SOLN
10.0000 mL | INTRAMUSCULAR | Status: DC | PRN
  Administered 2015-01-14: 10 mL
  Filled 2015-01-14: qty 10

## 2015-01-14 MED ORDER — PACLITAXEL CHEMO INJECTION 300 MG/50ML
35.0000 mg/m2 | Freq: Once | INTRAVENOUS | Status: AC
  Administered 2015-01-14: 60 mg via INTRAVENOUS
  Filled 2015-01-14: qty 10

## 2015-01-14 MED ORDER — LORAZEPAM 0.5 MG PO TABS: 0.5000 mg | ORAL_TABLET | Freq: Every day | ORAL | Status: DC

## 2015-01-14 MED ORDER — SODIUM CHLORIDE 0.9 % IV SOLN
Freq: Once | INTRAVENOUS | Status: AC
  Administered 2015-01-14: 12:00:00 via INTRAVENOUS

## 2015-01-14 MED ORDER — DIPHENHYDRAMINE HCL 50 MG/ML IJ SOLN
INTRAMUSCULAR | Status: AC
  Filled 2015-01-14: qty 1

## 2015-01-14 MED ORDER — FAMOTIDINE IN NACL 20-0.9 MG/50ML-% IV SOLN
INTRAVENOUS | Status: AC
  Filled 2015-01-14: qty 50

## 2015-01-14 MED ORDER — FAMOTIDINE IN NACL 20-0.9 MG/50ML-% IV SOLN
20.0000 mg | Freq: Once | INTRAVENOUS | Status: AC
  Administered 2015-01-14: 20 mg via INTRAVENOUS

## 2015-01-14 MED ORDER — DIPHENHYDRAMINE HCL 50 MG/ML IJ SOLN
25.0000 mg | Freq: Once | INTRAMUSCULAR | Status: AC
  Administered 2015-01-14: 25 mg via INTRAVENOUS

## 2015-01-14 MED ORDER — CARBOPLATIN CHEMO INJECTION 450 MG/45ML
104.7000 mg | Freq: Once | INTRAVENOUS | Status: AC
  Administered 2015-01-14: 100 mg via INTRAVENOUS
  Filled 2015-01-14: qty 10

## 2015-01-14 NOTE — Telephone Encounter (Signed)
Appointments made and michelle will add chemo and pt will get an avs in chemo

## 2015-01-14 NOTE — Progress Notes (Signed)
Patient Care Team: Hulan Fess, MD as PCP - General Excell Seltzer, MD as Consulting Physician (General Surgery) Nicholas Lose, MD as Consulting Physician (Hematology and Oncology) Thea Silversmith, MD as Consulting Physician (Radiation Oncology) Rockwell Germany, RN as Registered Nurse Mauro Kaufmann, RN as Registered Nurse  DIAGNOSIS: Breast cancer of upper-inner quadrant of right female breast   Staging form: Breast, AJCC 7th Edition     Clinical stage from 11/20/2014: Stage IIB (T2, N1, M0) - Unsigned   SUMMARY OF ONCOLOGIC HISTORY:   Breast cancer of upper-inner quadrant of right female breast   10/22/2014 Mammogram Highly suspicious 2.6 cm mass in the upper inner quadrant of the right breast at the 2 o'clock , Pathologic right axillary lymph nodes, including a node in the mid axillary line with eccentric cortical thickening up to 5 mm   11/01/2014 Initial Biopsy Right breast biopsy 2:00: IDC with DCIS, ER/PR 0%, Ki-67 39%, HER-2 negative; right axillary lymph node biopsy: Positive for IDC ER/PR 0%, 43%, HER-2 Negative Ratio 0.94   12/10/2014 -  Neo-Adjuvant Chemotherapy Weekly Taxol and carboplatin 12    CHIEF COMPLIANT: weeks 6/12 Taxol carboplatin  INTERVAL HISTORY: Danella Philson is a 81 year old with above-mentioned history of right breast cancer currently on neoadjuvant chemotherapy. She is today receiving week 6 of Taxol and carboplatin. Her ANC is 1100 today. She does not have any side effects from chemotherapy. She feels great has good appetite. Denies any nausea or vomiting. She reports a breast lump is smaller.  REVIEW OF SYSTEMS:   Constitutional: Denies fevers, chills or abnormal weight loss Eyes: Denies blurriness of vision Ears, nose, mouth, throat, and face: Denies mucositis or sore throat Respiratory: Denies cough, dyspnea or wheezes Cardiovascular: Denies palpitation, chest discomfort or lower extremity swelling Gastrointestinal:  Denies nausea, heartburn or  change in bowel habits Skin: Denies abnormal skin rashes Lymphatics: Denies new lymphadenopathy or easy bruising Neurological:Denies numbness, tingling or new weaknesses Behavioral/Psych: Mood is stable, no new changes  Breast: right breast lump is smaller All other systems were reviewed with the patient and are negative.  I have reviewed the past medical history, past surgical history, social history and family history with the patient and they are unchanged from previous note.  ALLERGIES:  has No Known Allergies.  MEDICATIONS:  Current Outpatient Prescriptions  Medication Sig Dispense Refill  . amLODipine (NORVASC) 2.5 MG tablet Take 2.5 mg by mouth daily.    . brimonidine (ALPHAGAN) 0.15 % ophthalmic solution Place 1 drop into both eyes 2 (two) times daily.    . Ferrous Sulfate (IRON SUPPLEMENT PO) Take by mouth. Liquid med that is OTC    . glimepiride (AMARYL) 1 MG tablet Take 1 mg by mouth daily with breakfast.   6  . HYDROcodone-acetaminophen (NORCO/VICODIN) 5-325 MG per tablet Take 1-2 tablets by mouth every 4 (four) hours as needed for moderate pain or severe pain. 30 tablet 0  . levothyroxine (SYNTHROID, LEVOTHROID) 50 MCG tablet Take 50 mcg by mouth daily before breakfast.    . lidocaine-prilocaine (EMLA) cream Apply 1 application topically as needed. 30 g 6  . lisinopril (PRINIVIL,ZESTRIL) 40 MG tablet Take 40 mg by mouth daily.    Marland Kitchen loteprednol (LOTEMAX) 0.5 % ophthalmic suspension Place 1 drop into both eyes 4 (four) times daily.    . metFORMIN (GLUCOPHAGE) 500 MG tablet Take 500-1,000 mg by mouth 2 (two) times daily with a meal. Take 1000 mg in the morning and 500 mg in the evening    .  Misc Natural Products (OSTEO BI-FLEX JOINT SHIELD PO) Take 1 tablet by mouth daily.    . Multiple Vitamin (MULTIVITAMIN) tablet Take 1 tablet by mouth daily.    . ondansetron (ZOFRAN) 8 MG tablet Take 1 tablet (8 mg total) by mouth 2 (two) times daily. Start the day after chemo for 3 days.  Then take as needed for nausea or vomiting. 30 tablet 1  . OVER THE COUNTER MEDICATION Take 1 tablet by mouth daily. sun chlorello    . OVER THE COUNTER MEDICATION Take 1 tablet by mouth 2 (two) times daily. Multi Nutrition Supplement    . Probiotic Product (PROBIOTIC DAILY PO) Take 1 tablet by mouth daily.    . prochlorperazine (COMPAZINE) 10 MG tablet Take 1 tablet (10 mg total) by mouth every 6 (six) hours as needed (Nausea or vomiting). 30 tablet 1  . LORazepam (ATIVAN) 0.5 MG tablet Take 1 tablet (0.5 mg total) by mouth at bedtime. 30 tablet 0   No current facility-administered medications for this visit.    PHYSICAL EXAMINATION: ECOG PERFORMANCE STATUS: 0 - Asymptomatic  Filed Vitals:   01/14/15 1034  BP: 131/57  Pulse: 78  Temp: 97.1 F (36.2 C)  Resp: 18   Filed Weights   01/14/15 1034  Weight: 157 lb 2 oz (71.271 kg)    GENERAL:alert, no distress and comfortable SKIN: skin color, texture, turgor are normal, no rashes or significant lesions EYES: normal, Conjunctiva are pink and non-injected, sclera clear OROPHARYNX:no exudate, no erythema and lips, buccal mucosa, and tongue normal  NECK: supple, thyroid normal size, non-tender, without nodularity LYMPH:  no palpable lymphadenopathy in the cervical, axillary or inguinal LUNGS: clear to auscultation and percussion with normal breathing effort HEART: regular rate & rhythm and no murmurs and no lower extremity edema ABDOMEN:abdomen soft, non-tender and normal bowel sounds Musculoskeletal:no cyanosis of digits and no clubbing  NEURO: alert & oriented x 3 with fluent speech, no focal motor/sensory deficits  LABORATORY DATA:  I have reviewed the data as listed   Chemistry      Component Value Date/Time   NA 140 01/07/2015 0943   NA 140 11/25/2014 1346   K 5.3* 01/07/2015 0943   K 4.9 11/25/2014 1346   CL 105 11/25/2014 1346   CO2 26 01/07/2015 0943   CO2 26 11/25/2014 1346   BUN 17.0 01/07/2015 0943   BUN 17  11/25/2014 1346   CREATININE 1.1 01/07/2015 0943   CREATININE 1.02 11/25/2014 1346      Component Value Date/Time   CALCIUM 9.4 01/07/2015 0943   CALCIUM 9.6 11/25/2014 1346   ALKPHOS 75 01/07/2015 0943   ALKPHOS 83 06/26/2012 0200   AST 17 01/07/2015 0943   AST 22 06/26/2012 0200   ALT 11 01/07/2015 0943   ALT 13 06/26/2012 0200   BILITOT 0.21 01/07/2015 0943   BILITOT 0.2* 06/26/2012 0200       Lab Results  Component Value Date   WBC 2.9* 01/14/2015   HGB 10.0* 01/14/2015   HCT 30.9* 01/14/2015   MCV 90.1 01/14/2015   PLT 246 01/14/2015   NEUTROABS 1.1* 01/14/2015    ASSESSMENT & PLAN:  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 ER 0%, PR 0%, HER-2 negative Ki-67 39%, 2.6 cm mass Right axillary lymph node biopsy positive for invasive ductal carcinoma ER 0% PR 0% HER-2 negative Ki-67 43% Clinical stage: T2 N1 M0 stage IIB  Current treatment: Neoadjuvant chemotherapy with weekly Taxol and carboplatin 12 started 12/10/2014; today is  week 6/12  Chemotherapy toxicities: Patient did not experience any side effects to chemotherapy. Mild constipation which improved with stool softeners. Patient is extremely happy with the staff at chemotherapy infusion. Did not have any nausea vomiting or neuropathy.  Monitoring closely for toxicities. I reviewed her blood counts and her ANC is 1.1. We will reduce the dosage of chemotherapy. I discussed with her that sometimes we may have to hold treatment if her [place] is less than 1000.  Normocytic anemia:Patient has chronic normocytic anemia which has been even present prior to starting chemotherapy.this is stable  Return to clinic weekly for chemotherapy and once every 2 weeks for follow-up with me. No orders of the defined types were placed in this encounter.   The patient has a good understanding of the overall plan. she agrees with it. She will call with any problems that may develop before her next visit here.   Rulon Eisenmenger,  MD   . Both

## 2015-01-14 NOTE — Progress Notes (Unsigned)
Met with pt during chemo treatment. Denies needs at this time. Relate she is doing well. Encourage pt to call with questions.

## 2015-01-14 NOTE — Telephone Encounter (Signed)
Per staff message and POF I have scheduled appts. Advised scheduler of appts. JMW  

## 2015-01-17 ENCOUNTER — Encounter: Payer: Self-pay | Admitting: Hematology and Oncology

## 2015-01-17 NOTE — Progress Notes (Signed)
I sent prior auth for Avitan

## 2015-01-17 NOTE — Progress Notes (Signed)
Per [facility] they approved a 30day supply of Lorazepam(Ativan). A prior auth needs to be done.

## 2015-01-20 ENCOUNTER — Telehealth: Payer: Self-pay

## 2015-01-20 NOTE — Telephone Encounter (Signed)
Brianna Valdez [phone] ref# [id number].  Forwarded to managed care.

## 2015-01-21 ENCOUNTER — Other Ambulatory Visit (HOSPITAL_BASED_OUTPATIENT_CLINIC_OR_DEPARTMENT_OTHER): Payer: Medicare HMO

## 2015-01-21 ENCOUNTER — Ambulatory Visit (HOSPITAL_BASED_OUTPATIENT_CLINIC_OR_DEPARTMENT_OTHER): Payer: Medicare HMO

## 2015-01-21 VITALS — BP 137/57 | HR 78 | Temp 97.5°F | Resp 18

## 2015-01-21 DIAGNOSIS — C773 Secondary and unspecified malignant neoplasm of axilla and upper limb lymph nodes: Secondary | ICD-10-CM

## 2015-01-21 DIAGNOSIS — Z5111 Encounter for antineoplastic chemotherapy: Secondary | ICD-10-CM | POA: Diagnosis not present

## 2015-01-21 DIAGNOSIS — C50211 Malignant neoplasm of upper-inner quadrant of right female breast: Secondary | ICD-10-CM

## 2015-01-21 DIAGNOSIS — C50212 Malignant neoplasm of upper-inner quadrant of left female breast: Secondary | ICD-10-CM

## 2015-01-21 LAB — COMPREHENSIVE METABOLIC PANEL (CC13)
ALK PHOS: 70 U/L (ref 40–150)
ALT: 13 U/L (ref 0–55)
AST: 16 U/L (ref 5–34)
Albumin: 3.5 g/dL (ref 3.5–5.0)
Anion Gap: 12 mEq/L — ABNORMAL HIGH (ref 3–11)
BUN: 16.2 mg/dL (ref 7.0–26.0)
CO2: 25 mEq/L (ref 22–29)
CREATININE: 1.1 mg/dL (ref 0.6–1.1)
Calcium: 9.7 mg/dL (ref 8.4–10.4)
Chloride: 104 mEq/L (ref 98–109)
EGFR: 55 mL/min/{1.73_m2} — ABNORMAL LOW (ref >90)
Glucose: 158 mg/dl — ABNORMAL HIGH (ref 70–140)
Potassium: 5.3 mEq/L — ABNORMAL HIGH (ref 3.5–5.1)
SODIUM: 141 meq/L (ref 136–145)
TOTAL PROTEIN: 6.9 g/dL (ref 6.4–8.3)

## 2015-01-21 LAB — CBC WITH DIFFERENTIAL/PLATELET
BASO%: 0.6 % (ref 0.0–2.0)
Basophils Absolute: 0 10*3/uL (ref 0.0–0.1)
EOS%: 0.3 % (ref 0.0–7.0)
Eosinophils Absolute: 0 10*3/uL (ref 0.0–0.5)
HCT: 31.4 % — ABNORMAL LOW (ref 34.8–46.6)
HGB: 10.1 g/dL — ABNORMAL LOW (ref 11.6–15.9)
LYMPH%: 40.8 % (ref 14.0–49.7)
MCH: 29.3 pg (ref 25.1–34.0)
MCHC: 32.2 g/dL (ref 31.5–36.0)
MCV: 91 fL (ref 79.5–101.0)
MONO#: 0.3 10*3/uL (ref 0.1–0.9)
MONO%: 8.3 % (ref 0.0–14.0)
NEUT#: 1.8 10*3/uL (ref 1.5–6.5)
NEUT%: 50 % (ref 38.4–76.8)
Platelets: 236 10*3/uL (ref 145–400)
RBC: 3.45 10*6/uL — ABNORMAL LOW (ref 3.70–5.45)
RDW: 15.2 % — ABNORMAL HIGH (ref 11.2–14.5)
WBC: 3.6 10*3/uL — ABNORMAL LOW (ref 3.9–10.3)
lymph#: 1.5 10*3/uL (ref 0.9–3.3)

## 2015-01-21 MED ORDER — FAMOTIDINE IN NACL 20-0.9 MG/50ML-% IV SOLN
INTRAVENOUS | Status: AC
  Filled 2015-01-21: qty 50

## 2015-01-21 MED ORDER — SODIUM CHLORIDE 0.9 % IV SOLN
Freq: Once | INTRAVENOUS | Status: AC
  Administered 2015-01-21: 12:00:00 via INTRAVENOUS

## 2015-01-21 MED ORDER — SODIUM CHLORIDE 0.9 % IJ SOLN
10.0000 mL | INTRAMUSCULAR | Status: DC | PRN
  Administered 2015-01-21: 10 mL
  Filled 2015-01-21: qty 10

## 2015-01-21 MED ORDER — DIPHENHYDRAMINE HCL 50 MG/ML IJ SOLN
25.0000 mg | Freq: Once | INTRAMUSCULAR | Status: AC
  Administered 2015-01-21: 25 mg via INTRAVENOUS

## 2015-01-21 MED ORDER — SODIUM CHLORIDE 0.9 % IV SOLN
104.7000 mg | Freq: Once | INTRAVENOUS | Status: AC
  Administered 2015-01-21: 100 mg via INTRAVENOUS
  Filled 2015-01-21: qty 10

## 2015-01-21 MED ORDER — PACLITAXEL CHEMO INJECTION 300 MG/50ML
35.0000 mg/m2 | Freq: Once | INTRAVENOUS | Status: AC
  Administered 2015-01-21: 60 mg via INTRAVENOUS
  Filled 2015-01-21: qty 10

## 2015-01-21 MED ORDER — FAMOTIDINE IN NACL 20-0.9 MG/50ML-% IV SOLN
20.0000 mg | Freq: Once | INTRAVENOUS | Status: AC
  Administered 2015-01-21: 20 mg via INTRAVENOUS

## 2015-01-21 MED ORDER — DIPHENHYDRAMINE HCL 50 MG/ML IJ SOLN
INTRAMUSCULAR | Status: AC
  Filled 2015-01-21: qty 1

## 2015-01-21 MED ORDER — HEPARIN SOD (PORK) LOCK FLUSH 100 UNIT/ML IV SOLN
500.0000 [IU] | Freq: Once | INTRAVENOUS | Status: AC | PRN
  Administered 2015-01-21: 500 [IU]
  Filled 2015-01-21: qty 5

## 2015-01-21 MED ORDER — SODIUM CHLORIDE 0.9 % IV SOLN
Freq: Once | INTRAVENOUS | Status: AC
  Administered 2015-01-21: 12:00:00 via INTRAVENOUS
  Filled 2015-01-21: qty 8

## 2015-01-21 NOTE — Patient Instructions (Signed)
[facility] Discharge Instructions for Patients Receiving Chemotherapy  Today you received the following chemotherapy agents Taxol /Carboplatin.  To help prevent nausea and vomiting after your treatment, we encourage you to take your nausea medication as needed   If you develop nausea and vomiting that is not controlled by your nausea medication, call the clinic.   BELOW ARE SYMPTOMS THAT SHOULD BE REPORTED IMMEDIATELY:  *FEVER GREATER THAN 100.5 F  *CHILLS WITH OR WITHOUT FEVER  NAUSEA AND VOMITING THAT IS NOT CONTROLLED WITH YOUR NAUSEA MEDICATION  *UNUSUAL SHORTNESS OF BREATH  *UNUSUAL BRUISING OR BLEEDING  TENDERNESS IN MOUTH AND THROAT WITH OR WITHOUT PRESENCE OF ULCERS  *URINARY PROBLEMS  *BOWEL PROBLEMS  UNUSUAL RASH Items with * indicate a potential emergency and should be followed up as soon as possible.  Feel free to call the clinic you have any questions or concerns. The clinic phone number is [phone].  Please show the CHEMO ALERT CARD at check-in to the Emergency Department and triage nurse.   

## 2015-01-27 NOTE — Assessment & Plan Note (Signed)
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 ER 0%, PR 0%, HER-2 negative Ki-67 39%, 2.6 cm mass Right axillary lymph node biopsy positive for invasive ductal carcinoma ER 0% PR 0% HER-2 negative Ki-67 43% Clinical stage: T2 N1 M0 stage IIB  Current treatment: Neoadjuvant chemotherapy with weekly Taxol and carboplatin 12 started 12/10/2014; today is week 8/12  Chemotherapy toxicities: Patient did not experience any side effects to chemotherapy. Mild constipation which improved with stool softeners. Patient is extremely happy with the staff at chemotherapy infusion. Did not have any nausea vomiting or neuropathy. 1. Neutropenia: After cycle 6: Dose reduced 2. Mild fatigue 3. Normocytic anemia:Patient has chronic normocytic anemia which has been even present prior to starting chemotherapy.this is stable  Monitoring closely for toxicities. Return to clinic weekly for chemotherapy and once every 2 weeks for follow-up with me.

## 2015-01-28 ENCOUNTER — Ambulatory Visit (HOSPITAL_BASED_OUTPATIENT_CLINIC_OR_DEPARTMENT_OTHER): Payer: Medicare HMO | Admitting: Hematology and Oncology

## 2015-01-28 ENCOUNTER — Telehealth: Payer: Self-pay | Admitting: Hematology and Oncology

## 2015-01-28 ENCOUNTER — Encounter: Payer: Self-pay | Admitting: *Deleted

## 2015-01-28 ENCOUNTER — Other Ambulatory Visit (HOSPITAL_BASED_OUTPATIENT_CLINIC_OR_DEPARTMENT_OTHER): Payer: Medicare HMO

## 2015-01-28 ENCOUNTER — Other Ambulatory Visit: Payer: Medicare HMO

## 2015-01-28 ENCOUNTER — Ambulatory Visit: Payer: Medicare HMO

## 2015-01-28 ENCOUNTER — Ambulatory Visit (HOSPITAL_BASED_OUTPATIENT_CLINIC_OR_DEPARTMENT_OTHER): Payer: Medicare HMO

## 2015-01-28 VITALS — BP 128/49 | HR 83 | Temp 98.2°F | Resp 18 | Ht 62.0 in | Wt 156.0 lb

## 2015-01-28 DIAGNOSIS — C50211 Malignant neoplasm of upper-inner quadrant of right female breast: Secondary | ICD-10-CM | POA: Diagnosis not present

## 2015-01-28 DIAGNOSIS — C773 Secondary and unspecified malignant neoplasm of axilla and upper limb lymph nodes: Secondary | ICD-10-CM

## 2015-01-28 DIAGNOSIS — R5383 Other fatigue: Secondary | ICD-10-CM

## 2015-01-28 DIAGNOSIS — D649 Anemia, unspecified: Secondary | ICD-10-CM

## 2015-01-28 DIAGNOSIS — Z452 Encounter for adjustment and management of vascular access device: Secondary | ICD-10-CM

## 2015-01-28 DIAGNOSIS — C50212 Malignant neoplasm of upper-inner quadrant of left female breast: Secondary | ICD-10-CM

## 2015-01-28 DIAGNOSIS — Z5111 Encounter for antineoplastic chemotherapy: Secondary | ICD-10-CM

## 2015-01-28 LAB — COMPREHENSIVE METABOLIC PANEL (CC13)
ALT: 13 U/L (ref 0–55)
AST: 17 U/L (ref 5–34)
Albumin: 3.5 g/dL (ref 3.5–5.0)
Alkaline Phosphatase: 76 U/L (ref 40–150)
Anion Gap: 11 mEq/L (ref 3–11)
BILIRUBIN TOTAL: 0.2 mg/dL (ref 0.20–1.20)
BUN: 17.8 mg/dL (ref 7.0–26.0)
CALCIUM: 9.6 mg/dL (ref 8.4–10.4)
CO2: 25 mEq/L (ref 22–29)
Chloride: 106 mEq/L (ref 98–109)
Creatinine: 1.1 mg/dL (ref 0.6–1.1)
EGFR: 52 mL/min/{1.73_m2} — AB (ref >90)
Glucose: 134 mg/dl (ref 70–140)
Potassium: 5.1 mEq/L (ref 3.5–5.1)
SODIUM: 142 meq/L (ref 136–145)
Total Protein: 6.8 g/dL (ref 6.4–8.3)

## 2015-01-28 LAB — CBC WITH DIFFERENTIAL/PLATELET
BASO%: 0.3 % (ref 0.0–2.0)
BASOS ABS: 0 10*3/uL (ref 0.0–0.1)
EOS%: 0.6 % (ref 0.0–7.0)
Eosinophils Absolute: 0 10*3/uL (ref 0.0–0.5)
HEMATOCRIT: 31.2 % — AB (ref 34.8–46.6)
HGB: 10.1 g/dL — ABNORMAL LOW (ref 11.6–15.9)
LYMPH#: 1.6 10*3/uL (ref 0.9–3.3)
LYMPH%: 51.3 % — ABNORMAL HIGH (ref 14.0–49.7)
MCH: 29.7 pg (ref 25.1–34.0)
MCHC: 32.4 g/dL (ref 31.5–36.0)
MCV: 91.8 fL (ref 79.5–101.0)
MONO#: 0.3 10*3/uL (ref 0.1–0.9)
MONO%: 8.7 % (ref 0.0–14.0)
NEUT#: 1.2 10*3/uL — ABNORMAL LOW (ref 1.5–6.5)
NEUT%: 39.1 % (ref 38.4–76.8)
Platelets: 241 10*3/uL (ref 145–400)
RBC: 3.4 10*6/uL — AB (ref 3.70–5.45)
RDW: 15.4 % — ABNORMAL HIGH (ref 11.2–14.5)
WBC: 3.1 10*3/uL — ABNORMAL LOW (ref 3.9–10.3)

## 2015-01-28 MED ORDER — FAMOTIDINE IN NACL 20-0.9 MG/50ML-% IV SOLN
INTRAVENOUS | Status: AC
  Filled 2015-01-28: qty 50

## 2015-01-28 MED ORDER — ALTEPLASE 2 MG IJ SOLR
2.0000 mg | Freq: Once | INTRAMUSCULAR | Status: AC | PRN
  Administered 2015-01-28: 2 mg
  Filled 2015-01-28: qty 2

## 2015-01-28 MED ORDER — SODIUM CHLORIDE 0.9 % IJ SOLN
10.0000 mL | INTRAMUSCULAR | Status: DC | PRN
  Administered 2015-01-28: 10 mL
  Filled 2015-01-28: qty 10

## 2015-01-28 MED ORDER — FAMOTIDINE IN NACL 20-0.9 MG/50ML-% IV SOLN
20.0000 mg | Freq: Once | INTRAVENOUS | Status: AC
  Administered 2015-01-28: 20 mg via INTRAVENOUS

## 2015-01-28 MED ORDER — PACLITAXEL CHEMO INJECTION 300 MG/50ML
35.0000 mg/m2 | Freq: Once | INTRAVENOUS | Status: AC
  Administered 2015-01-28: 60 mg via INTRAVENOUS
  Filled 2015-01-28: qty 10

## 2015-01-28 MED ORDER — DIPHENHYDRAMINE HCL 50 MG/ML IJ SOLN
INTRAMUSCULAR | Status: AC
  Filled 2015-01-28: qty 1

## 2015-01-28 MED ORDER — DIPHENHYDRAMINE HCL 50 MG/ML IJ SOLN
25.0000 mg | Freq: Once | INTRAMUSCULAR | Status: AC
  Administered 2015-01-28: 25 mg via INTRAVENOUS

## 2015-01-28 MED ORDER — SODIUM CHLORIDE 0.9 % IV SOLN
104.7000 mg | Freq: Once | INTRAVENOUS | Status: AC
  Administered 2015-01-28: 100 mg via INTRAVENOUS
  Filled 2015-01-28: qty 10

## 2015-01-28 MED ORDER — HEPARIN SOD (PORK) LOCK FLUSH 100 UNIT/ML IV SOLN
500.0000 [IU] | Freq: Once | INTRAVENOUS | Status: AC | PRN
  Administered 2015-01-28: 500 [IU]
  Filled 2015-01-28: qty 5

## 2015-01-28 MED ORDER — SODIUM CHLORIDE 0.9 % IV SOLN
Freq: Once | INTRAVENOUS | Status: AC
  Administered 2015-01-28: 14:00:00 via INTRAVENOUS
  Filled 2015-01-28: qty 8

## 2015-01-28 MED ORDER — SODIUM CHLORIDE 0.9 % IV SOLN
Freq: Once | INTRAVENOUS | Status: AC
  Administered 2015-01-28: 14:00:00 via INTRAVENOUS

## 2015-01-28 NOTE — Progress Notes (Signed)
TPA left to dwell, while pt received chemo peripherally. 1530: Blood return noted after pt turned head to the R with deep inhalation. Port flushed, heparinized and de-accessed.

## 2015-01-28 NOTE — Progress Notes (Signed)
Patient Care Team: Hulan Fess, MD as PCP - General Excell Seltzer, MD as Consulting Physician (General Surgery) Nicholas Lose, MD as Consulting Physician (Hematology and Oncology) Thea Silversmith, MD as Consulting Physician (Radiation Oncology) Rockwell Germany, RN as Registered Nurse Mauro Kaufmann, RN as Registered Nurse  DIAGNOSIS: Breast cancer of upper-inner quadrant of right female breast   Staging form: Breast, AJCC 7th Edition     Clinical stage from 11/20/2014: Stage IIB (T2, N1, M0) - Unsigned   SUMMARY OF ONCOLOGIC HISTORY:   Breast cancer of upper-inner quadrant of right female breast   10/22/2014 Mammogram Highly suspicious 2.6 cm mass in the upper inner quadrant of the right breast at the 2 o'clock , Pathologic right axillary lymph nodes, including a node in the mid axillary line with eccentric cortical thickening up to 5 mm   11/01/2014 Initial Biopsy Right breast biopsy 2:00: IDC with DCIS, ER/PR 0%, Ki-67 39%, HER-2 negative; right axillary lymph node biopsy: Positive for IDC ER/PR 0%, 43%, HER-2 Negative Ratio 0.94   12/10/2014 -  Neo-Adjuvant Chemotherapy Weekly Taxol and carboplatin 12    CHIEF COMPLIANT: Week 8/12 Taxol and carboplatin  INTERVAL HISTORY: Brianna Valdez is a 81 year old lady with above-mentioned history of triple negative right breast cancer currently on neoadjuvant chemotherapy with weekly Taxol and carboplatin. She has been tolerating chemotherapy excellently. Denies any nausea or vomiting. Denies any neuropathy.  REVIEW OF SYSTEMS:   Constitutional: Denies fevers, chills or abnormal weight loss Eyes: Denies blurriness of vision Ears, nose, mouth, throat, and face: Denies mucositis or sore throat Respiratory: Denies cough, dyspnea or wheezes Cardiovascular: Denies palpitation, chest discomfort or lower extremity swelling Gastrointestinal:  Denies nausea, heartburn or change in bowel habits Skin: Denies abnormal skin rashes Lymphatics:  Denies new lymphadenopathy or easy bruising Neurological:Denies numbness, tingling or new weaknesses Behavioral/Psych: Mood is stable, no new changes  All other systems were reviewed with the patient and are negative.  I have reviewed the past medical history, past surgical history, social history and family history with the patient and they are unchanged from previous note.  ALLERGIES:  has No Known Allergies.  MEDICATIONS:  Current Outpatient Prescriptions  Medication Sig Dispense Refill  . amLODipine (NORVASC) 2.5 MG tablet Take 2.5 mg by mouth daily.    . brimonidine (ALPHAGAN) 0.15 % ophthalmic solution Place 1 drop into both eyes 2 (two) times daily.    . Ferrous Sulfate (IRON SUPPLEMENT PO) Take by mouth. Liquid med that is OTC    . glimepiride (AMARYL) 1 MG tablet Take 1 mg by mouth daily with breakfast.   6  . HYDROcodone-acetaminophen (NORCO/VICODIN) 5-325 MG per tablet Take 1-2 tablets by mouth every 4 (four) hours as needed for moderate pain or severe pain. 30 tablet 0  . levothyroxine (SYNTHROID, LEVOTHROID) 50 MCG tablet Take 50 mcg by mouth daily before breakfast.    . lidocaine-prilocaine (EMLA) cream Apply 1 application topically as needed. 30 g 6  . lisinopril (PRINIVIL,ZESTRIL) 40 MG tablet Take 40 mg by mouth daily.    Marland Kitchen loteprednol (LOTEMAX) 0.5 % ophthalmic suspension Place 1 drop into both eyes 4 (four) times daily.    . metFORMIN (GLUCOPHAGE) 500 MG tablet Take 500-1,000 mg by mouth 2 (two) times daily with a meal. Take 1000 mg in the morning and 500 mg in the evening    . Misc Natural Products (OSTEO BI-FLEX JOINT SHIELD PO) Take 1 tablet by mouth daily.    . Multiple Vitamin (MULTIVITAMIN) tablet Take  1 tablet by mouth daily.    Marland Kitchen OVER THE COUNTER MEDICATION Take 1 tablet by mouth daily. sun chlorello    . OVER THE COUNTER MEDICATION Take 1 tablet by mouth 2 (two) times daily. Multi Nutrition Supplement    . Probiotic Product (PROBIOTIC DAILY PO) Take 1 tablet by  mouth daily.    Marland Kitchen LORazepam (ATIVAN) 0.5 MG tablet Take 1 tablet (0.5 mg total) by mouth at bedtime. (Patient not taking: Reported on 01/28/2015) 30 tablet 0  . ondansetron (ZOFRAN) 8 MG tablet Take 1 tablet (8 mg total) by mouth 2 (two) times daily. Start the day after chemo for 3 days. Then take as needed for nausea or vomiting. (Patient not taking: Reported on 01/28/2015) 30 tablet 1  . prochlorperazine (COMPAZINE) 10 MG tablet Take 1 tablet (10 mg total) by mouth every 6 (six) hours as needed (Nausea or vomiting). (Patient not taking: Reported on 01/28/2015) 30 tablet 1   No current facility-administered medications for this visit.    PHYSICAL EXAMINATION: ECOG PERFORMANCE STATUS: 1 - Symptomatic but completely ambulatory  Filed Vitals:   01/28/15 1005  BP: 128/49  Pulse: 83  Temp: 98.2 F (36.8 C)  Resp: 18   Filed Weights   01/28/15 1005  Weight: 156 lb (70.761 kg)    GENERAL:alert, no distress and comfortable SKIN: skin color, texture, turgor are normal, no rashes or significant lesions EYES: normal, Conjunctiva are pink and non-injected, sclera clear OROPHARYNX:no exudate, no erythema and lips, buccal mucosa, and tongue normal  NECK: supple, thyroid normal size, non-tender, without nodularity LYMPH:  no palpable lymphadenopathy in the cervical, axillary or inguinal LUNGS: clear to auscultation and percussion with normal breathing effort HEART: regular rate & rhythm and no murmurs and no lower extremity edema ABDOMEN:abdomen soft, non-tender and normal bowel sounds Musculoskeletal:no cyanosis of digits and no clubbing  NEURO: alert & oriented x 3 with fluent speech, no focal motor/sensory deficits  LABORATORY DATA:  I have reviewed the data as listed   Chemistry      Component Value Date/Time   NA 141 01/21/2015 1125   NA 140 11/25/2014 1346   K 5.3* 01/21/2015 1125   K 4.9 11/25/2014 1346   CL 105 11/25/2014 1346   CO2 25 01/21/2015 1125   CO2 26 11/25/2014 1346    BUN 16.2 01/21/2015 1125   BUN 17 11/25/2014 1346   CREATININE 1.1 01/21/2015 1125   CREATININE 1.02 11/25/2014 1346      Component Value Date/Time   CALCIUM 9.7 01/21/2015 1125   CALCIUM 9.6 11/25/2014 1346   ALKPHOS 70 01/21/2015 1125   ALKPHOS 83 06/26/2012 0200   AST 16 01/21/2015 1125   AST 22 06/26/2012 0200   ALT 13 01/21/2015 1125   ALT 13 06/26/2012 0200   BILITOT <0.20 01/21/2015 1125   BILITOT 0.2* 06/26/2012 0200       Lab Results  Component Value Date   WBC 3.1* 01/28/2015   HGB 10.1* 01/28/2015   HCT 31.2* 01/28/2015   MCV 91.8 01/28/2015   PLT 241 01/28/2015   NEUTROABS 1.2* 01/28/2015   ASSESSMENT & PLAN:  Breast cancer of upper-inner quadrant of right female breast 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 ER 0%, PR 0%, HER-2 negative Ki-67 39%, 2.6 cm mass Right axillary lymph node biopsy positive for invasive ductal carcinoma ER 0% PR 0% HER-2 negative Ki-67 43% Clinical stage: T2 N1 M0 stage IIB  Current treatment: Neoadjuvant chemotherapy with weekly Taxol and carboplatin 12  started 12/10/2014; today is week 8/12  Chemotherapy toxicities: Patient did not experience any side effects to chemotherapy. Mild constipation which improved with stool softeners. Patient is extremely happy with the staff at chemotherapy infusion. Did not have any nausea vomiting or neuropathy. 1. Neutropenia: After cycle 6: Dose reduced; ANC today is 1200 and she is good to get treated today. 2. Mild fatigue 3. Normocytic anemia:Patient has chronic normocytic anemia which has been even present prior to starting chemotherapy.this is stable When she comes back to see his in 2 weeks, we will set up her breast MRI and surgery follow-ups.  Monitoring closely for toxicities. Return to clinic weekly for chemotherapy and once every 2 weeks for follow-up with me.     Orders Placed This Encounter  Procedures  . CBC with Differential    Standing Status: Future     Number of  Occurrences:      Standing Expiration Date: 01/28/2016  . Comprehensive metabolic panel (Cmet) - [facility]    Standing Status: Future     Number of Occurrences:      Standing Expiration Date: 01/28/2016   The patient has a good understanding of the overall plan. she agrees with it. She will call with any problems that may develop before her next visit here.   Rulon Eisenmenger, MD

## 2015-01-28 NOTE — Patient Instructions (Addendum)
[facility] Discharge Instructions for Patients Receiving Chemotherapy  Today you received the following chemotherapy agents; Taxol and Carboplatin.   To help prevent nausea and vomiting after your treatment, we encourage you to take your nausea medication: Zofran. Take one every 8 hours as needed. Take Compazine every six hours as needed.    If you develop nausea and vomiting that is not controlled by your nausea medication, call the clinic.   BELOW ARE SYMPTOMS THAT SHOULD BE REPORTED IMMEDIATELY:  *FEVER GREATER THAN 100.5 F  *CHILLS WITH OR WITHOUT FEVER  NAUSEA AND VOMITING THAT IS NOT CONTROLLED WITH YOUR NAUSEA MEDICATION  *UNUSUAL SHORTNESS OF BREATH  *UNUSUAL BRUISING OR BLEEDING  TENDERNESS IN MOUTH AND THROAT WITH OR WITHOUT PRESENCE OF ULCERS  *URINARY PROBLEMS  *BOWEL PROBLEMS  UNUSUAL RASH Items with * indicate a potential emergency and should be followed up as soon as possible.  Feel free to call the clinic should you have any questions or concerns. The clinic phone number is [phone].  Please show the [place] at check-in to the Emergency Department and triage nurse.

## 2015-01-28 NOTE — Progress Notes (Unsigned)
Met with pt prior to chemo treatment. Relate she is doing well. Denies needs at this time. Encourage pt to call with questions or concerns.

## 2015-01-28 NOTE — Telephone Encounter (Signed)
Appointments made and avs printed for patient °

## 2015-01-30 ENCOUNTER — Encounter: Payer: Self-pay | Admitting: Hematology and Oncology

## 2015-01-30 NOTE — Progress Notes (Signed)
Per [facility], they approved 30day only supply for Avitan. It was filled 01/15/15.

## 2015-02-04 ENCOUNTER — Ambulatory Visit (HOSPITAL_BASED_OUTPATIENT_CLINIC_OR_DEPARTMENT_OTHER): Payer: Medicare HMO

## 2015-02-04 ENCOUNTER — Other Ambulatory Visit (HOSPITAL_BASED_OUTPATIENT_CLINIC_OR_DEPARTMENT_OTHER): Payer: Medicare HMO

## 2015-02-04 DIAGNOSIS — Z5111 Encounter for antineoplastic chemotherapy: Secondary | ICD-10-CM

## 2015-02-04 DIAGNOSIS — C50211 Malignant neoplasm of upper-inner quadrant of right female breast: Secondary | ICD-10-CM | POA: Diagnosis not present

## 2015-02-04 DIAGNOSIS — C50212 Malignant neoplasm of upper-inner quadrant of left female breast: Secondary | ICD-10-CM

## 2015-02-04 DIAGNOSIS — C773 Secondary and unspecified malignant neoplasm of axilla and upper limb lymph nodes: Secondary | ICD-10-CM

## 2015-02-04 LAB — COMPREHENSIVE METABOLIC PANEL (CC13)
ALBUMIN: 3.5 g/dL (ref 3.5–5.0)
ALT: 11 U/L (ref 0–55)
ANION GAP: 9 meq/L (ref 3–11)
AST: 16 U/L (ref 5–34)
Alkaline Phosphatase: 68 U/L (ref 40–150)
BUN: 17.1 mg/dL (ref 7.0–26.0)
CALCIUM: 9 mg/dL (ref 8.4–10.4)
CHLORIDE: 105 meq/L (ref 98–109)
CO2: 26 meq/L (ref 22–29)
CREATININE: 1 mg/dL (ref 0.6–1.1)
EGFR: 62 mL/min/{1.73_m2} — ABNORMAL LOW (ref >90)
Glucose: 173 mg/dl — ABNORMAL HIGH (ref 70–140)
POTASSIUM: 5.2 meq/L — AB (ref 3.5–5.1)
Sodium: 139 mEq/L (ref 136–145)
TOTAL PROTEIN: 6.8 g/dL (ref 6.4–8.3)
Total Bilirubin: 0.23 mg/dL (ref 0.20–1.20)

## 2015-02-04 LAB — CBC WITH DIFFERENTIAL/PLATELET
BASO%: 0.9 % (ref 0.0–2.0)
BASOS ABS: 0 10*3/uL (ref 0.0–0.1)
EOS%: 0.6 % (ref 0.0–7.0)
Eosinophils Absolute: 0 10*3/uL (ref 0.0–0.5)
HEMATOCRIT: 29.6 % — AB (ref 34.8–46.6)
HEMOGLOBIN: 9.4 g/dL — AB (ref 11.6–15.9)
LYMPH#: 1.5 10*3/uL (ref 0.9–3.3)
LYMPH%: 49.9 % — ABNORMAL HIGH (ref 14.0–49.7)
MCH: 29.3 pg (ref 25.1–34.0)
MCHC: 31.8 g/dL (ref 31.5–36.0)
MCV: 91.9 fL (ref 79.5–101.0)
MONO#: 0.3 10*3/uL (ref 0.1–0.9)
MONO%: 8.8 % (ref 0.0–14.0)
NEUT%: 39.8 % (ref 38.4–76.8)
NEUTROS ABS: 1.2 10*3/uL — AB (ref 1.5–6.5)
Platelets: 268 10*3/uL (ref 145–400)
RBC: 3.22 10*6/uL — ABNORMAL LOW (ref 3.70–5.45)
RDW: 16.9 % — ABNORMAL HIGH (ref 11.2–14.5)
WBC: 3.1 10*3/uL — AB (ref 3.9–10.3)

## 2015-02-04 MED ORDER — SODIUM CHLORIDE 0.9 % IV SOLN
Freq: Once | INTRAVENOUS | Status: AC
  Administered 2015-02-04: 15:00:00 via INTRAVENOUS
  Filled 2015-02-04: qty 8

## 2015-02-04 MED ORDER — HEPARIN SOD (PORK) LOCK FLUSH 100 UNIT/ML IV SOLN
500.0000 [IU] | Freq: Once | INTRAVENOUS | Status: AC | PRN
Start: 2015-02-04 — End: 2015-02-04
  Administered 2015-02-04: 500 [IU]
  Filled 2015-02-04: qty 5

## 2015-02-04 MED ORDER — FAMOTIDINE IN NACL 20-0.9 MG/50ML-% IV SOLN
20.0000 mg | Freq: Once | INTRAVENOUS | Status: AC
  Administered 2015-02-04: 20 mg via INTRAVENOUS

## 2015-02-04 MED ORDER — PACLITAXEL CHEMO INJECTION 300 MG/50ML
35.0000 mg/m2 | Freq: Once | INTRAVENOUS | Status: AC
  Administered 2015-02-04: 60 mg via INTRAVENOUS
  Filled 2015-02-04: qty 10

## 2015-02-04 MED ORDER — DIPHENHYDRAMINE HCL 50 MG/ML IJ SOLN
INTRAMUSCULAR | Status: AC
  Filled 2015-02-04: qty 1

## 2015-02-04 MED ORDER — SODIUM CHLORIDE 0.9 % IV SOLN
Freq: Once | INTRAVENOUS | Status: AC
  Administered 2015-02-04: 15:00:00 via INTRAVENOUS

## 2015-02-04 MED ORDER — FAMOTIDINE IN NACL 20-0.9 MG/50ML-% IV SOLN
INTRAVENOUS | Status: AC
  Filled 2015-02-04: qty 50

## 2015-02-04 MED ORDER — SODIUM CHLORIDE 0.9 % IV SOLN
104.7000 mg | Freq: Once | INTRAVENOUS | Status: AC
  Administered 2015-02-04: 100 mg via INTRAVENOUS
  Filled 2015-02-04: qty 10

## 2015-02-04 MED ORDER — DIPHENHYDRAMINE HCL 50 MG/ML IJ SOLN
25.0000 mg | Freq: Once | INTRAMUSCULAR | Status: AC
  Administered 2015-02-04: 25 mg via INTRAVENOUS

## 2015-02-04 MED ORDER — SODIUM CHLORIDE 0.9 % IJ SOLN
10.0000 mL | INTRAMUSCULAR | Status: DC | PRN
  Administered 2015-02-04: 10 mL
  Filled 2015-02-04: qty 10

## 2015-02-04 NOTE — Patient Instructions (Signed)
[facility] Discharge Instructions for Patients Receiving Chemotherapy  Today you received the following chemotherapy agents taxol/carboplatin  To help prevent nausea and vomiting after your treatment, we encourage you to take your nausea medication as directed   If you develop nausea and vomiting that is not controlled by your nausea medication, call the clinic.   BELOW ARE SYMPTOMS THAT SHOULD BE REPORTED IMMEDIATELY:  *FEVER GREATER THAN 100.5 F  *CHILLS WITH OR WITHOUT FEVER  NAUSEA AND VOMITING THAT IS NOT CONTROLLED WITH YOUR NAUSEA MEDICATION  *UNUSUAL SHORTNESS OF BREATH  *UNUSUAL BRUISING OR BLEEDING  TENDERNESS IN MOUTH AND THROAT WITH OR WITHOUT PRESENCE OF ULCERS  *URINARY PROBLEMS  *BOWEL PROBLEMS  UNUSUAL RASH Items with * indicate a potential emergency and should be followed up as soon as possible.  Feel free to call the clinic you have any questions or concerns. The clinic phone number is [phone].  

## 2015-02-04 NOTE — Progress Notes (Signed)
Ok to treat with ANC 1.2 per MD Gudena 

## 2015-02-11 ENCOUNTER — Other Ambulatory Visit: Payer: Self-pay | Admitting: *Deleted

## 2015-02-11 ENCOUNTER — Other Ambulatory Visit (HOSPITAL_BASED_OUTPATIENT_CLINIC_OR_DEPARTMENT_OTHER): Payer: Medicare HMO

## 2015-02-11 ENCOUNTER — Ambulatory Visit (HOSPITAL_BASED_OUTPATIENT_CLINIC_OR_DEPARTMENT_OTHER): Payer: Medicare HMO

## 2015-02-11 ENCOUNTER — Telehealth: Payer: Self-pay | Admitting: Hematology and Oncology

## 2015-02-11 ENCOUNTER — Ambulatory Visit (HOSPITAL_BASED_OUTPATIENT_CLINIC_OR_DEPARTMENT_OTHER): Payer: Medicare HMO | Admitting: Hematology and Oncology

## 2015-02-11 VITALS — BP 140/82 | HR 67 | Temp 98.8°F | Resp 18 | Ht 62.0 in | Wt 155.8 lb

## 2015-02-11 DIAGNOSIS — D649 Anemia, unspecified: Secondary | ICD-10-CM

## 2015-02-11 DIAGNOSIS — C50211 Malignant neoplasm of upper-inner quadrant of right female breast: Secondary | ICD-10-CM

## 2015-02-11 DIAGNOSIS — C50212 Malignant neoplasm of upper-inner quadrant of left female breast: Secondary | ICD-10-CM

## 2015-02-11 DIAGNOSIS — Z171 Estrogen receptor negative status [ER-]: Secondary | ICD-10-CM | POA: Diagnosis not present

## 2015-02-11 DIAGNOSIS — C773 Secondary and unspecified malignant neoplasm of axilla and upper limb lymph nodes: Secondary | ICD-10-CM

## 2015-02-11 DIAGNOSIS — Z5111 Encounter for antineoplastic chemotherapy: Secondary | ICD-10-CM

## 2015-02-11 LAB — CBC WITH DIFFERENTIAL/PLATELET
BASO%: 1.1 % (ref 0.0–2.0)
Basophils Absolute: 0 10*3/uL (ref 0.0–0.1)
EOS%: 0.7 % (ref 0.0–7.0)
Eosinophils Absolute: 0 10*3/uL (ref 0.0–0.5)
HCT: 31.4 % — ABNORMAL LOW (ref 34.8–46.6)
HGB: 10.1 g/dL — ABNORMAL LOW (ref 11.6–15.9)
LYMPH#: 1.6 10*3/uL (ref 0.9–3.3)
LYMPH%: 47.3 % (ref 14.0–49.7)
MCH: 29.8 pg (ref 25.1–34.0)
MCHC: 32.1 g/dL (ref 31.5–36.0)
MCV: 92.6 fL (ref 79.5–101.0)
MONO#: 0.3 10*3/uL (ref 0.1–0.9)
MONO%: 7.8 % (ref 0.0–14.0)
NEUT%: 43.1 % (ref 38.4–76.8)
NEUTROS ABS: 1.5 10*3/uL (ref 1.5–6.5)
Platelets: 331 10*3/uL (ref 145–400)
RBC: 3.39 10*6/uL — ABNORMAL LOW (ref 3.70–5.45)
RDW: 17.2 % — ABNORMAL HIGH (ref 11.2–14.5)
WBC: 3.4 10*3/uL — ABNORMAL LOW (ref 3.9–10.3)

## 2015-02-11 LAB — COMPREHENSIVE METABOLIC PANEL (CC13)
ALT: 11 U/L (ref 0–55)
ANION GAP: 11 meq/L (ref 3–11)
AST: 16 U/L (ref 5–34)
Albumin: 3.6 g/dL (ref 3.5–5.0)
Alkaline Phosphatase: 79 U/L (ref 40–150)
BILIRUBIN TOTAL: 0.2 mg/dL (ref 0.20–1.20)
BUN: 19.8 mg/dL (ref 7.0–26.0)
CO2: 24 meq/L (ref 22–29)
Calcium: 9.3 mg/dL (ref 8.4–10.4)
Chloride: 106 mEq/L (ref 98–109)
Creatinine: 1 mg/dL (ref 0.6–1.1)
EGFR: 59 mL/min/{1.73_m2} — AB (ref >90)
GLUCOSE: 135 mg/dL (ref 70–140)
Potassium: 5 mEq/L (ref 3.5–5.1)
Sodium: 141 mEq/L (ref 136–145)
Total Protein: 7 g/dL (ref 6.4–8.3)

## 2015-02-11 MED ORDER — SODIUM CHLORIDE 0.9 % IV SOLN
104.7000 mg | Freq: Once | INTRAVENOUS | Status: AC
  Administered 2015-02-11: 100 mg via INTRAVENOUS
  Filled 2015-02-11: qty 10

## 2015-02-11 MED ORDER — SODIUM CHLORIDE 0.9 % IV SOLN
Freq: Once | INTRAVENOUS | Status: AC
  Administered 2015-02-11: 15:00:00 via INTRAVENOUS

## 2015-02-11 MED ORDER — FAMOTIDINE IN NACL 20-0.9 MG/50ML-% IV SOLN
INTRAVENOUS | Status: AC
  Filled 2015-02-11: qty 50

## 2015-02-11 MED ORDER — SODIUM CHLORIDE 0.9 % IJ SOLN
10.0000 mL | INTRAMUSCULAR | Status: DC | PRN
Start: 2015-02-11 — End: 2015-02-11
  Administered 2015-02-11: 10 mL
  Filled 2015-02-11: qty 10

## 2015-02-11 MED ORDER — FAMOTIDINE IN NACL 20-0.9 MG/50ML-% IV SOLN
20.0000 mg | Freq: Once | INTRAVENOUS | Status: AC
  Administered 2015-02-11: 20 mg via INTRAVENOUS

## 2015-02-11 MED ORDER — PACLITAXEL CHEMO INJECTION 300 MG/50ML
35.0000 mg/m2 | Freq: Once | INTRAVENOUS | Status: AC
  Administered 2015-02-11: 60 mg via INTRAVENOUS
  Filled 2015-02-11: qty 10

## 2015-02-11 MED ORDER — UNABLE TO FIND: 1.0000 | Status: DC | PRN

## 2015-02-11 MED ORDER — DIPHENHYDRAMINE HCL 50 MG/ML IJ SOLN
INTRAMUSCULAR | Status: AC
  Filled 2015-02-11: qty 1

## 2015-02-11 MED ORDER — HEPARIN SOD (PORK) LOCK FLUSH 100 UNIT/ML IV SOLN
500.0000 [IU] | Freq: Once | INTRAVENOUS | Status: AC | PRN
  Administered 2015-02-11: 500 [IU]
  Filled 2015-02-11: qty 5

## 2015-02-11 MED ORDER — DIPHENHYDRAMINE HCL 50 MG/ML IJ SOLN
25.0000 mg | Freq: Once | INTRAMUSCULAR | Status: AC
  Administered 2015-02-11: 25 mg via INTRAVENOUS

## 2015-02-11 MED ORDER — SODIUM CHLORIDE 0.9 % IV SOLN
Freq: Once | INTRAVENOUS | Status: AC
  Administered 2015-02-11: 15:00:00 via INTRAVENOUS
  Filled 2015-02-11: qty 8

## 2015-02-11 NOTE — Assessment & Plan Note (Addendum)
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 ER 0%, PR 0%, HER-2 negative Ki-67 39%, 2.6 cm mass Right axillary lymph node biopsy positive for invasive ductal carcinoma ER 0% PR 0% HER-2 negative Ki-67 43% Clinical stage: T2 N1 M0 stage IIB  Current treatment: Neoadjuvant chemotherapy with weekly Taxol and carboplatin 12 started 12/10/2014; today is week 10/12  Chemotherapy toxicities: Patient did not experience any side effects to chemotherapy. Mild constipation which improved with stool softeners. Patient is extremely happy with the staff at chemotherapy infusion. Did not have any nausea vomiting or neuropathy. 1. Neutropenia: After cycle 6: Dose reduced 2. Mild fatigue 3. Normocytic anemia:Patient has chronic normocytic anemia which has been even present prior to starting chemotherapy.this is stable  Monitoring closely for toxicities.  Plan:  1.  Patient to complete chemotherapy  02/25/2015 2.  Breast MRI 02/28/2015 3.  Follow-up 03/05/2015 To review the MRI after breast tumor board  Return to clinic weekly for chemotherapy and once every 2 weeks for follow-up with me.

## 2015-02-11 NOTE — Progress Notes (Signed)
Patient Care Team: Hulan Fess, MD as PCP - General Excell Seltzer, MD as Consulting Physician (General Surgery) Nicholas Lose, MD as Consulting Physician (Hematology and Oncology) Thea Silversmith, MD as Consulting Physician (Radiation Oncology) Rockwell Germany, RN as Registered Nurse Mauro Kaufmann, RN as Registered Nurse  DIAGNOSIS: Breast cancer of upper-inner quadrant of right female breast   Staging form: Breast, AJCC 7th Edition     Clinical stage from 11/20/2014: Stage IIB (T2, N1, M0) - Unsigned   SUMMARY OF ONCOLOGIC HISTORY:   Breast cancer of upper-inner quadrant of right female breast   10/22/2014 Mammogram Highly suspicious 2.6 cm mass in the upper inner quadrant of the right breast at the 2 o'clock , Pathologic right axillary lymph nodes, including a node in the mid axillary line with eccentric cortical thickening up to 5 mm   11/01/2014 Initial Biopsy Right breast biopsy 2:00: IDC with DCIS, ER/PR 0%, Ki-67 39%, HER-2 negative; right axillary lymph node biopsy: Positive for IDC ER/PR 0%, 43%, HER-2 Negative Ratio 0.94   12/10/2014 -  Neo-Adjuvant Chemotherapy Weekly Taxol and carboplatin 12    CHIEF COMPLIANT:  Cycle 10/12 Taxol and carboplatin  INTERVAL HISTORY: Brianna Valdez is a 81 year old lady with above-mentioned history of right-sided breast cancer triple negative disease currently on neoadjuvant chemotherapy with weekly Taxol and carboplatin. Today is cycle 10 of 12. She is tolerating chemotherapy extremely well. Denies any nausea vomiting diarrhea or constipation. Denies any fevers or chills. Denies any neuropathy.  REVIEW OF SYSTEMS:   Constitutional: Denies fevers, chills or abnormal weight loss Eyes: Denies blurriness of vision Ears, nose, mouth, throat, and face: Denies mucositis or sore throat Respiratory: Denies cough, dyspnea or wheezes Cardiovascular: Denies palpitation, chest discomfort or lower extremity swelling Gastrointestinal:  Denies  nausea, heartburn or change in bowel habits Skin: Denies abnormal skin rashes Lymphatics: Denies new lymphadenopathy or easy bruising Neurological:Denies numbness, tingling or new weaknesses Behavioral/Psych: Mood is stable, no new changes  All other systems were reviewed with the patient and are negative.  I have reviewed the past medical history, past surgical history, social history and family history with the patient and they are unchanged from previous note.  ALLERGIES:  has No Known Allergies.  MEDICATIONS:  Current Outpatient Prescriptions  Medication Sig Dispense Refill  . amLODipine (NORVASC) 2.5 MG tablet Take 2.5 mg by mouth daily.    . brimonidine (ALPHAGAN) 0.15 % ophthalmic solution Place 1 drop into both eyes 2 (two) times daily.    . Ferrous Sulfate (IRON SUPPLEMENT PO) Take by mouth. Liquid med that is OTC    . glimepiride (AMARYL) 1 MG tablet Take 1 mg by mouth daily with breakfast.   6  . HYDROcodone-acetaminophen (NORCO/VICODIN) 5-325 MG per tablet Take 1-2 tablets by mouth every 4 (four) hours as needed for moderate pain or severe pain. 30 tablet 0  . levothyroxine (SYNTHROID, LEVOTHROID) 50 MCG tablet Take 50 mcg by mouth daily before breakfast.    . lidocaine-prilocaine (EMLA) cream Apply 1 application topically as needed. 30 g 6  . lisinopril (PRINIVIL,ZESTRIL) 40 MG tablet Take 40 mg by mouth daily.    Marland Kitchen LORazepam (ATIVAN) 0.5 MG tablet Take 1 tablet (0.5 mg total) by mouth at bedtime. (Patient not taking: Reported on 01/28/2015) 30 tablet 0  . loteprednol (LOTEMAX) 0.5 % ophthalmic suspension Place 1 drop into both eyes 4 (four) times daily.    . metFORMIN (GLUCOPHAGE) 500 MG tablet Take 500-1,000 mg by mouth 2 (two) times daily with  a meal. Take 1000 mg in the morning and 500 mg in the evening    . Misc Natural Products (OSTEO BI-FLEX JOINT SHIELD PO) Take 1 tablet by mouth daily.    . Multiple Vitamin (MULTIVITAMIN) tablet Take 1 tablet by mouth daily.    .  ondansetron (ZOFRAN) 8 MG tablet Take 1 tablet (8 mg total) by mouth 2 (two) times daily. Start the day after chemo for 3 days. Then take as needed for nausea or vomiting. (Patient not taking: Reported on 01/28/2015) 30 tablet 1  . OVER THE COUNTER MEDICATION Take 1 tablet by mouth daily. sun chlorello    . OVER THE COUNTER MEDICATION Take 1 tablet by mouth 2 (two) times daily. Multi Nutrition Supplement    . Probiotic Product (PROBIOTIC DAILY PO) Take 1 tablet by mouth daily.    . prochlorperazine (COMPAZINE) 10 MG tablet Take 1 tablet (10 mg total) by mouth every 6 (six) hours as needed (Nausea or vomiting). (Patient not taking: Reported on 01/28/2015) 30 tablet 1   No current facility-administered medications for this visit.    PHYSICAL EXAMINATION: ECOG PERFORMANCE STATUS: 0 - Asymptomatic  There were no vitals filed for this visit. There were no vitals filed for this visit.  GENERAL:alert, no distress and comfortable SKIN: skin color, texture, turgor are normal, no rashes or significant lesions EYES: normal, Conjunctiva are pink and non-injected, sclera clear OROPHARYNX:no exudate, no erythema and lips, buccal mucosa, and tongue normal  NECK: supple, thyroid normal size, non-tender, without nodularity LYMPH:  no palpable lymphadenopathy in the cervical, axillary or inguinal LUNGS: clear to auscultation and percussion with normal breathing effort HEART: regular rate & rhythm and no murmurs and no lower extremity edema ABDOMEN:abdomen soft, non-tender and normal bowel sounds Musculoskeletal:no cyanosis of digits and no clubbing  NEURO: alert & oriented x 3 with fluent speech, no focal motor/sensory deficits BREAST: No palpable masses or nodules in either right or left breasts. No palpable axillary supraclavicular or infraclavicular adenopathy no breast tenderness or nipple discharge. (exam performed in the presence of a chaperone)  LABORATORY DATA:  I have reviewed the data as listed    Chemistry      Component Value Date/Time   NA 139 02/04/2015 1332   NA 140 11/25/2014 1346   K 5.2* 02/04/2015 1332   K 4.9 11/25/2014 1346   CL 105 11/25/2014 1346   CO2 26 02/04/2015 1332   CO2 26 11/25/2014 1346   BUN 17.1 02/04/2015 1332   BUN 17 11/25/2014 1346   CREATININE 1.0 02/04/2015 1332   CREATININE 1.02 11/25/2014 1346      Component Value Date/Time   CALCIUM 9.0 02/04/2015 1332   CALCIUM 9.6 11/25/2014 1346   ALKPHOS 68 02/04/2015 1332   ALKPHOS 83 06/26/2012 0200   AST 16 02/04/2015 1332   AST 22 06/26/2012 0200   ALT 11 02/04/2015 1332   ALT 13 06/26/2012 0200   BILITOT 0.23 02/04/2015 1332   BILITOT 0.2* 06/26/2012 0200       Lab Results  Component Value Date   WBC 3.4* 02/11/2015   HGB 10.1* 02/11/2015   HCT 31.4* 02/11/2015   MCV 92.6 02/11/2015   PLT 331 02/11/2015   NEUTROABS 1.5 02/11/2015   ASSESSMENT & PLAN:  Breast cancer of upper-inner quadrant of right female breast 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 ER 0%, PR 0%, HER-2 negative Ki-67 39%, 2.6 cm mass Right axillary lymph node biopsy positive for invasive ductal carcinoma ER 0%  PR 0% HER-2 negative Ki-67 43% Clinical stage: T2 N1 M0 stage IIB  Current treatment: Neoadjuvant chemotherapy with weekly Taxol and carboplatin 12 started 12/10/2014; today is week 10/12  Chemotherapy toxicities: Patient did not experience any side effects to chemotherapy. Mild constipation which improved with stool softeners. Patient is extremely happy with the staff at chemotherapy infusion. Did not have any nausea vomiting or neuropathy. 1. Neutropenia: After cycle 6: Dose reduced 2. Mild fatigue 3. Normocytic anemia:Patient has chronic normocytic anemia which has been even present prior to starting chemotherapy.this is stable  Monitoring closely for toxicities.  Plan:  1.  Patient to complete chemotherapy  02/25/2015 2.  Breast MRI 02/28/2015 3.  Follow-up 03/05/2015 To review the MRI after  breast tumor board  Return to clinic weekly for chemotherapy and once every 2 weeks for follow-up with me.      No orders of the defined types were placed in this encounter.   The patient has a good understanding of the overall plan. she agrees with it. she will call with any problems that may develop before the next visit here.   Gudena, Vinay K, MD    

## 2015-02-11 NOTE — Patient Instructions (Signed)
[facility] Discharge Instructions for Patients Receiving Chemotherapy  Today you received the following chemotherapy agents Taxol/Carboplatin.  To help prevent nausea and vomiting after your treatment, we encourage you to take your nausea medication as directed.   If you develop nausea and vomiting that is not controlled by your nausea medication, call the clinic.   BELOW ARE SYMPTOMS THAT SHOULD BE REPORTED IMMEDIATELY:  *FEVER GREATER THAN 100.5 F  *CHILLS WITH OR WITHOUT FEVER  NAUSEA AND VOMITING THAT IS NOT CONTROLLED WITH YOUR NAUSEA MEDICATION  *UNUSUAL SHORTNESS OF BREATH  *UNUSUAL BRUISING OR BLEEDING  TENDERNESS IN MOUTH AND THROAT WITH OR WITHOUT PRESENCE OF ULCERS  *URINARY PROBLEMS  *BOWEL PROBLEMS  UNUSUAL RASH Items with * indicate a potential emergency and should be followed up as soon as possible.  Feel free to call the clinic you have any questions or concerns. The clinic phone number is [phone].  Please show the CHEMO ALERT CARD at check-in to the Emergency Department and triage nurse.    

## 2015-02-11 NOTE — Telephone Encounter (Signed)
Gave patient avs report and appointments for May and June. Patient also given inf for [facility] to call in for breast mri.

## 2015-02-18 ENCOUNTER — Other Ambulatory Visit (HOSPITAL_BASED_OUTPATIENT_CLINIC_OR_DEPARTMENT_OTHER): Payer: Medicare HMO

## 2015-02-18 ENCOUNTER — Ambulatory Visit (HOSPITAL_BASED_OUTPATIENT_CLINIC_OR_DEPARTMENT_OTHER): Payer: Medicare HMO

## 2015-02-18 VITALS — BP 113/84 | HR 75 | Temp 97.6°F | Resp 19

## 2015-02-18 DIAGNOSIS — Z452 Encounter for adjustment and management of vascular access device: Secondary | ICD-10-CM

## 2015-02-18 DIAGNOSIS — Z5111 Encounter for antineoplastic chemotherapy: Secondary | ICD-10-CM | POA: Diagnosis not present

## 2015-02-18 DIAGNOSIS — C50212 Malignant neoplasm of upper-inner quadrant of left female breast: Secondary | ICD-10-CM | POA: Diagnosis not present

## 2015-02-18 LAB — CBC WITH DIFFERENTIAL/PLATELET
BASO%: 1.2 % (ref 0.0–2.0)
BASOS ABS: 0 10*3/uL (ref 0.0–0.1)
EOS%: 0.7 % (ref 0.0–7.0)
Eosinophils Absolute: 0 10*3/uL (ref 0.0–0.5)
HCT: 31.7 % — ABNORMAL LOW (ref 34.8–46.6)
HEMOGLOBIN: 10.3 g/dL — AB (ref 11.6–15.9)
LYMPH%: 45.9 % (ref 14.0–49.7)
MCH: 30 pg (ref 25.1–34.0)
MCHC: 32.4 g/dL (ref 31.5–36.0)
MCV: 92.6 fL (ref 79.5–101.0)
MONO#: 0.3 10*3/uL (ref 0.1–0.9)
MONO%: 9.2 % (ref 0.0–14.0)
NEUT%: 43 % (ref 38.4–76.8)
NEUTROS ABS: 1.4 10*3/uL — AB (ref 1.5–6.5)
Platelets: 337 10*3/uL (ref 145–400)
RBC: 3.43 10*6/uL — AB (ref 3.70–5.45)
RDW: 17 % — ABNORMAL HIGH (ref 11.2–14.5)
WBC: 3.4 10*3/uL — AB (ref 3.9–10.3)
lymph#: 1.5 10*3/uL (ref 0.9–3.3)

## 2015-02-18 LAB — COMPREHENSIVE METABOLIC PANEL (CC13)
ALK PHOS: 71 U/L (ref 40–150)
ALT: 13 U/L (ref 0–55)
AST: 19 U/L (ref 5–34)
Albumin: 3.7 g/dL (ref 3.5–5.0)
Anion Gap: 11 mEq/L (ref 3–11)
BUN: 17.9 mg/dL (ref 7.0–26.0)
CO2: 26 mEq/L (ref 22–29)
CREATININE: 1.1 mg/dL (ref 0.6–1.1)
Calcium: 9.5 mg/dL (ref 8.4–10.4)
Chloride: 105 mEq/L (ref 98–109)
EGFR: 55 mL/min/{1.73_m2} — ABNORMAL LOW (ref >90)
Glucose: 125 mg/dl (ref 70–140)
Potassium: 5.2 mEq/L — ABNORMAL HIGH (ref 3.5–5.1)
Sodium: 142 mEq/L (ref 136–145)
Total Bilirubin: 0.27 mg/dL (ref 0.20–1.20)
Total Protein: 7.1 g/dL (ref 6.4–8.3)

## 2015-02-18 MED ORDER — FAMOTIDINE IN NACL 20-0.9 MG/50ML-% IV SOLN
INTRAVENOUS | Status: AC
  Filled 2015-02-18: qty 50

## 2015-02-18 MED ORDER — SODIUM CHLORIDE 0.9 % IJ SOLN
10.0000 mL | INTRAMUSCULAR | Status: DC | PRN
  Administered 2015-02-18: 10 mL
  Filled 2015-02-18: qty 10

## 2015-02-18 MED ORDER — DEXAMETHASONE SODIUM PHOSPHATE 100 MG/10ML IJ SOLN
Freq: Once | INTRAMUSCULAR | Status: AC
  Administered 2015-02-18: 13:00:00 via INTRAVENOUS
  Filled 2015-02-18: qty 8

## 2015-02-18 MED ORDER — PACLITAXEL CHEMO INJECTION 300 MG/50ML
35.0000 mg/m2 | Freq: Once | INTRAVENOUS | Status: AC
  Administered 2015-02-18: 60 mg via INTRAVENOUS
  Filled 2015-02-18: qty 10

## 2015-02-18 MED ORDER — FAMOTIDINE IN NACL 20-0.9 MG/50ML-% IV SOLN
20.0000 mg | Freq: Once | INTRAVENOUS | Status: AC
  Administered 2015-02-18: 20 mg via INTRAVENOUS

## 2015-02-18 MED ORDER — DIPHENHYDRAMINE HCL 50 MG/ML IJ SOLN
INTRAMUSCULAR | Status: AC
  Filled 2015-02-18: qty 1

## 2015-02-18 MED ORDER — SODIUM CHLORIDE 0.9 % IV SOLN
104.7000 mg | Freq: Once | INTRAVENOUS | Status: AC
  Administered 2015-02-18: 100 mg via INTRAVENOUS
  Filled 2015-02-18: qty 10

## 2015-02-18 MED ORDER — DIPHENHYDRAMINE HCL 50 MG/ML IJ SOLN
25.0000 mg | Freq: Once | INTRAMUSCULAR | Status: AC
  Administered 2015-02-18: 25 mg via INTRAVENOUS

## 2015-02-18 MED ORDER — ALTEPLASE 2 MG IJ SOLR
2.0000 mg | Freq: Once | INTRAMUSCULAR | Status: AC | PRN
  Administered 2015-02-18: 2 mg
  Filled 2015-02-18: qty 2

## 2015-02-18 MED ORDER — SODIUM CHLORIDE 0.9 % IV SOLN
Freq: Once | INTRAVENOUS | Status: AC
Start: 2015-02-18 — End: 2015-02-18
  Administered 2015-02-18: 13:00:00 via INTRAVENOUS

## 2015-02-18 MED ORDER — HEPARIN SOD (PORK) LOCK FLUSH 100 UNIT/ML IV SOLN
500.0000 [IU] | Freq: Once | INTRAVENOUS | Status: AC | PRN
  Administered 2015-02-18: 500 [IU]
  Filled 2015-02-18: qty 5

## 2015-02-18 NOTE — Patient Instructions (Signed)
[facility] Discharge Instructions for Patients Receiving Chemotherapy  Today you received the following chemotherapy agents: Taxol, Carboplatin  To help prevent nausea and vomiting after your treatment, we encourage you to take your nausea medication as prescribed by your physician.   If you develop nausea and vomiting that is not controlled by your nausea medication, call the clinic.   BELOW ARE SYMPTOMS THAT SHOULD BE REPORTED IMMEDIATELY:  *FEVER GREATER THAN 100.5 F  *CHILLS WITH OR WITHOUT FEVER  NAUSEA AND VOMITING THAT IS NOT CONTROLLED WITH YOUR NAUSEA MEDICATION  *UNUSUAL SHORTNESS OF BREATH  *UNUSUAL BRUISING OR BLEEDING  TENDERNESS IN MOUTH AND THROAT WITH OR WITHOUT PRESENCE OF ULCERS  *URINARY PROBLEMS  *BOWEL PROBLEMS  UNUSUAL RASH Items with * indicate a potential emergency and should be followed up as soon as possible.  Feel free to call the clinic you have any questions or concerns. The clinic phone number is [phone].  Please show the CHEMO ALERT CARD at check-in to the Emergency Department and triage nurse.   

## 2015-02-18 NOTE — Progress Notes (Signed)
Ok to treat with ANC 1.4 per Dr Gudena °

## 2015-02-25 ENCOUNTER — Telehealth: Payer: Self-pay | Admitting: Hematology and Oncology

## 2015-02-25 ENCOUNTER — Ambulatory Visit: Payer: Medicare HMO

## 2015-02-25 ENCOUNTER — Other Ambulatory Visit: Payer: Self-pay | Admitting: Hematology and Oncology

## 2015-02-25 ENCOUNTER — Ambulatory Visit (HOSPITAL_BASED_OUTPATIENT_CLINIC_OR_DEPARTMENT_OTHER): Payer: Medicare HMO

## 2015-02-25 ENCOUNTER — Ambulatory Visit: Payer: Medicare HMO | Admitting: Hematology and Oncology

## 2015-02-25 ENCOUNTER — Other Ambulatory Visit (HOSPITAL_BASED_OUTPATIENT_CLINIC_OR_DEPARTMENT_OTHER): Payer: Medicare HMO

## 2015-02-25 ENCOUNTER — Encounter: Payer: Self-pay | Admitting: *Deleted

## 2015-02-25 ENCOUNTER — Telehealth: Payer: Self-pay | Admitting: *Deleted

## 2015-02-25 ENCOUNTER — Other Ambulatory Visit: Payer: Medicare HMO

## 2015-02-25 ENCOUNTER — Ambulatory Visit (HOSPITAL_BASED_OUTPATIENT_CLINIC_OR_DEPARTMENT_OTHER): Payer: Medicare HMO | Admitting: Hematology and Oncology

## 2015-02-25 VITALS — BP 148/59 | HR 83 | Temp 97.4°F | Resp 18 | Ht 62.0 in | Wt 155.0 lb

## 2015-02-25 DIAGNOSIS — D649 Anemia, unspecified: Secondary | ICD-10-CM

## 2015-02-25 DIAGNOSIS — C50211 Malignant neoplasm of upper-inner quadrant of right female breast: Secondary | ICD-10-CM

## 2015-02-25 DIAGNOSIS — C773 Secondary and unspecified malignant neoplasm of axilla and upper limb lymph nodes: Secondary | ICD-10-CM

## 2015-02-25 DIAGNOSIS — R5383 Other fatigue: Secondary | ICD-10-CM

## 2015-02-25 DIAGNOSIS — C50212 Malignant neoplasm of upper-inner quadrant of left female breast: Secondary | ICD-10-CM

## 2015-02-25 DIAGNOSIS — D709 Neutropenia, unspecified: Secondary | ICD-10-CM | POA: Diagnosis not present

## 2015-02-25 DIAGNOSIS — Z5111 Encounter for antineoplastic chemotherapy: Secondary | ICD-10-CM | POA: Diagnosis not present

## 2015-02-25 LAB — CBC WITH DIFFERENTIAL/PLATELET
BASO%: 0.3 % (ref 0.0–2.0)
Basophils Absolute: 0 10*3/uL (ref 0.0–0.1)
EOS%: 0.3 % (ref 0.0–7.0)
Eosinophils Absolute: 0 10*3/uL (ref 0.0–0.5)
HCT: 31.7 % — ABNORMAL LOW (ref 34.8–46.6)
HGB: 10.1 g/dL — ABNORMAL LOW (ref 11.6–15.9)
LYMPH%: 47.1 % (ref 14.0–49.7)
MCH: 30 pg (ref 25.1–34.0)
MCHC: 31.9 g/dL (ref 31.5–36.0)
MCV: 94.1 fL (ref 79.5–101.0)
MONO#: 0.3 10*3/uL (ref 0.1–0.9)
MONO%: 9.4 % (ref 0.0–14.0)
NEUT#: 1.5 10*3/uL (ref 1.5–6.5)
NEUT%: 42.9 % (ref 38.4–76.8)
Platelets: 293 10*3/uL (ref 145–400)
RBC: 3.37 10*6/uL — ABNORMAL LOW (ref 3.70–5.45)
RDW: 16.5 % — ABNORMAL HIGH (ref 11.2–14.5)
WBC: 3.4 10*3/uL — ABNORMAL LOW (ref 3.9–10.3)
lymph#: 1.6 10*3/uL (ref 0.9–3.3)

## 2015-02-25 LAB — COMPREHENSIVE METABOLIC PANEL (CC13)
ALT: 9 U/L (ref 0–55)
AST: 16 U/L (ref 5–34)
Albumin: 3.7 g/dL (ref 3.5–5.0)
Alkaline Phosphatase: 71 U/L (ref 40–150)
Anion Gap: 9 mEq/L (ref 3–11)
BILIRUBIN TOTAL: 0.22 mg/dL (ref 0.20–1.20)
BUN: 15.5 mg/dL (ref 7.0–26.0)
CALCIUM: 9.2 mg/dL (ref 8.4–10.4)
CO2: 27 mEq/L (ref 22–29)
CREATININE: 1.1 mg/dL (ref 0.6–1.1)
Chloride: 104 mEq/L (ref 98–109)
EGFR: 52 mL/min/{1.73_m2} — ABNORMAL LOW (ref >90)
Glucose: 155 mg/dl — ABNORMAL HIGH (ref 70–140)
Potassium: 5.2 mEq/L — ABNORMAL HIGH (ref 3.5–5.1)
Sodium: 141 mEq/L (ref 136–145)
TOTAL PROTEIN: 7.1 g/dL (ref 6.4–8.3)

## 2015-02-25 MED ORDER — HEPARIN SOD (PORK) LOCK FLUSH 100 UNIT/ML IV SOLN
500.0000 [IU] | Freq: Once | INTRAVENOUS | Status: AC | PRN
  Administered 2015-02-25: 500 [IU]
  Filled 2015-02-25: qty 5

## 2015-02-25 MED ORDER — DEXAMETHASONE SODIUM PHOSPHATE 100 MG/10ML IJ SOLN
Freq: Once | INTRAMUSCULAR | Status: AC
  Administered 2015-02-25: 14:00:00 via INTRAVENOUS
  Filled 2015-02-25: qty 8

## 2015-02-25 MED ORDER — FAMOTIDINE IN NACL 20-0.9 MG/50ML-% IV SOLN
INTRAVENOUS | Status: AC
  Filled 2015-02-25: qty 50

## 2015-02-25 MED ORDER — SODIUM CHLORIDE 0.9 % IV SOLN
104.7000 mg | Freq: Once | INTRAVENOUS | Status: AC
  Administered 2015-02-25: 100 mg via INTRAVENOUS
  Filled 2015-02-25: qty 10

## 2015-02-25 MED ORDER — PACLITAXEL CHEMO INJECTION 300 MG/50ML
35.0000 mg/m2 | Freq: Once | INTRAVENOUS | Status: AC
  Administered 2015-02-25: 60 mg via INTRAVENOUS
  Filled 2015-02-25: qty 10

## 2015-02-25 MED ORDER — SODIUM CHLORIDE 0.9 % IV SOLN
Freq: Once | INTRAVENOUS | Status: AC
  Administered 2015-02-25: 13:00:00 via INTRAVENOUS

## 2015-02-25 MED ORDER — FAMOTIDINE IN NACL 20-0.9 MG/50ML-% IV SOLN
20.0000 mg | Freq: Once | INTRAVENOUS | Status: AC
  Administered 2015-02-25: 20 mg via INTRAVENOUS

## 2015-02-25 MED ORDER — SODIUM CHLORIDE 0.9 % IJ SOLN
10.0000 mL | INTRAMUSCULAR | Status: DC | PRN
  Administered 2015-02-25: 10 mL
  Filled 2015-02-25: qty 10

## 2015-02-25 MED ORDER — DIPHENHYDRAMINE HCL 50 MG/ML IJ SOLN
INTRAMUSCULAR | Status: AC
  Filled 2015-02-25: qty 1

## 2015-02-25 MED ORDER — DIPHENHYDRAMINE HCL 50 MG/ML IJ SOLN
25.0000 mg | Freq: Once | INTRAMUSCULAR | Status: AC
  Administered 2015-02-25: 25 mg via INTRAVENOUS

## 2015-02-25 NOTE — Patient Instructions (Addendum)
[facility] Discharge Instructions for Patients Receiving Chemotherapy  Today you received the following chemotherapy agents: Taxol and Carboplatin.  To help prevent nausea and vomiting after your treatment, we encourage you to take your nausea medication: Zofran. Take one every 8 hours as needed.   If you develop nausea and vomiting that is not controlled by your nausea medication, call the clinic.   BELOW ARE SYMPTOMS THAT SHOULD BE REPORTED IMMEDIATELY:  *FEVER GREATER THAN 100.5 F  *CHILLS WITH OR WITHOUT FEVER  NAUSEA AND VOMITING THAT IS NOT CONTROLLED WITH YOUR NAUSEA MEDICATION  *UNUSUAL SHORTNESS OF BREATH  *UNUSUAL BRUISING OR BLEEDING  TENDERNESS IN MOUTH AND THROAT WITH OR WITHOUT PRESENCE OF ULCERS  *URINARY PROBLEMS  *BOWEL PROBLEMS  UNUSUAL RASH Items with * indicate a potential emergency and should be followed up as soon as possible.  Feel free to call the clinic should  you have any questions or concerns. The clinic phone number is [phone].  Please show the [facility] at check-in to the Emergency Department and triage nurse.    Implanted [facility] Guide An implanted port is a type of central line that is placed under the skin. Central lines are used to provide IV access when treatment or nutrition needs to be given through a person's veins. Implanted ports are used for long-term IV access. An implanted port may be placed because:   You need IV medicine that would be irritating to the small veins in your hands or arms.   You need long-term IV medicines, such as antibiotics.   You need IV nutrition for a long period.   You need frequent blood draws for lab tests.   You need dialysis.  Implanted ports are usually placed in the chest area, but they can also be placed in the upper arm, the abdomen, or the leg. An implanted port has two main parts:   Reservoir. The reservoir is round and will appear as a small, raised area  under your skin. The reservoir is the part where a needle is inserted to give medicines or draw blood.   Catheter. The catheter is a thin, flexible tube that extends from the reservoir. The catheter is placed into a large vein. Medicine that is inserted into the reservoir goes into the catheter and then into the vein.  HOW WILL I CARE FOR MY INCISION SITE? Do not get the incision site wet. Bathe or shower as directed by your health care provider.  HOW IS MY PORT ACCESSED? Special steps must be taken to access the port:   Before the port is accessed, a numbing cream can be placed on the skin. This helps numb the skin over the port site.   Your health care provider uses a sterile technique to access the port.  Your health care provider must put on a mask and sterile gloves.  The skin over your port is cleaned carefully with an antiseptic and allowed to dry.  The port is gently pinched between sterile gloves, and a needle is inserted into the port.  Only "non-coring" port needles should be used to access the port. Once the port is accessed, a blood return should be checked. This helps ensure that the port is in the vein and is not clogged.   If your port needs to remain accessed for a constant infusion, a clear (transparent) bandage will be placed over the needle site. The bandage and needle will need to be changed every week,  or as directed by your health care provider.   Keep the bandage covering the needle clean and dry. Do not get it wet. Follow your health care provider's instructions on how to take a shower or bath while the port is accessed.   If your port does not need to stay accessed, no bandage is needed over the port.  WHAT IS FLUSHING? Flushing helps keep the port from getting clogged. Follow your health care provider's instructions on how and when to flush the port. Ports are usually flushed with saline solution or a medicine called heparin. The need for flushing will  depend on how the port is used.   If the port is used for intermittent medicines or blood draws, the port will need to be flushed:   After medicines have been given.   After blood has been drawn.   As part of routine maintenance.   If a constant infusion is running, the port may not need to be flushed.  HOW LONG WILL MY PORT STAY IMPLANTED? The port can stay in for as long as your health care provider thinks it is needed. When it is time for the port to come out, surgery will be done to remove it. The procedure is similar to the one performed when the port was put in.  WHEN SHOULD I SEEK IMMEDIATE MEDICAL CARE? When you have an implanted port, you should seek immediate medical care if:   You notice a bad smell coming from the incision site.   You have swelling, redness, or drainage at the incision site.   You have more swelling or pain at the port site or the surrounding area.   You have a fever that is not controlled with medicine. Document Released: 09/20/2005 Document Revised: 07/11/2013 Document Reviewed: 05/28/2013 [facility] Patient Information 2015 [place], [place]. This information is not intended to replace advice given to you by your health care provider. Make sure you discuss any questions you have with your health care provider.

## 2015-02-25 NOTE — Telephone Encounter (Signed)
error 

## 2015-02-25 NOTE — Progress Notes (Signed)
Met with pt during final chemo. Relate she is doing well and very excited that it is her last treatment. Denies needs. Encourage pt to call with questions or concern. Received verbal understanding.

## 2015-02-25 NOTE — Assessment & Plan Note (Signed)
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 ER 0%, PR 0%, HER-2 negative Ki-67 39%, 2.6 cm mass Right axillary lymph node biopsy positive for invasive ductal carcinoma ER 0% PR 0% HER-2 negative Ki-67 43% Clinical stage: T2 N1 M0 stage IIB  Current treatment: Neoadjuvant chemotherapy with weekly Taxol and carboplatin 12 started 12/10/2014; today is week 10/12  Chemotherapy toxicities: Patient did not experience any side effects to chemotherapy. Mild constipation which improved with stool softeners. Patient is extremely happy with the staff at chemotherapy infusion. Did not have any nausea vomiting or neuropathy. 1. Neutropenia: After cycle 6: Dose reduced 2. Mild fatigue 3. Normocytic anemia:Patient has chronic normocytic anemia which has been even present prior to starting chemotherapy.this is stable  Monitoring closely for toxicities.  Plan:  1.  Completed chemotherapy  02/25/2015 2.  Breast MRI 03/04/2015 3.  Follow-up 03/05/2015 To review the MRI  4. Follow up with Dr.Hoxworth 03/14/15

## 2015-02-25 NOTE — Telephone Encounter (Signed)
Due to [facility] moved 6/1 appointments to PM. Left message for dtr Threasa Alpha and patient will be given new scheduled at visit today.

## 2015-02-25 NOTE — Progress Notes (Signed)
Patient Care Team: Hulan Fess, MD as PCP - General Excell Seltzer, MD as Consulting Physician (General Surgery) Nicholas Lose, MD as Consulting Physician (Hematology and Oncology) Thea Silversmith, MD as Consulting Physician (Radiation Oncology) Rockwell Germany, RN as Registered Nurse Mauro Kaufmann, RN as Registered Nurse  DIAGNOSIS: Breast cancer of upper-inner quadrant of right female breast   Staging form: Breast, AJCC 7th Edition     Clinical stage from 11/20/2014: Stage IIB (T2, N1, M0) - Unsigned   SUMMARY OF ONCOLOGIC HISTORY:   Breast cancer of upper-inner quadrant of right female breast   10/22/2014 Mammogram Highly suspicious 2.6 cm mass in the upper inner quadrant of the right breast at the 2 o'clock , Pathologic right axillary lymph nodes, including a node in the mid axillary line with eccentric cortical thickening up to 5 mm   11/01/2014 Initial Biopsy Right breast biopsy 2:00: IDC with DCIS, ER/PR 0%, Ki-67 39%, HER-2 negative; right axillary lymph node biopsy: Positive for IDC ER/PR 0%, 43%, HER-2 Negative Ratio 0.94   12/10/2014 -  Neo-Adjuvant Chemotherapy Weekly Taxol and carboplatin 12    CHIEF COMPLIANT: Week 12 Taxol and carboplatin  INTERVAL HISTORY: Brianna Valdez is a 81 year old with above-mentioned history of triple negative right breast cancer, who is on neoadjuvant chemotherapy with weekly Taxol and carboplatin 12. Today is a 12 cycle of chemotherapy and she had done extraordinarily well with very minimal toxicities to treatment. Denies any nausea or vomiting. Energy levels are excellent.  REVIEW OF SYSTEMS:   Constitutional: Denies fevers, chills or abnormal weight loss Eyes: Denies blurriness of vision Ears, nose, mouth, throat, and face: Denies mucositis or sore throat Respiratory: Denies cough, dyspnea or wheezes Cardiovascular: Denies palpitation, chest discomfort or lower extremity swelling Gastrointestinal:  Denies nausea, heartburn or change  in bowel habits Skin: Denies abnormal skin rashes Lymphatics: Denies new lymphadenopathy or easy bruising Neurological:Denies numbness, tingling or new weaknesses Behavioral/Psych: Mood is stable, no new changes   All other systems were reviewed with the patient and are negative.  I have reviewed the past medical history, past surgical history, social history and family history with the patient and they are unchanged from previous note.  ALLERGIES:  has No Known Allergies.  MEDICATIONS:  Current Outpatient Prescriptions  Medication Sig Dispense Refill  . amLODipine (NORVASC) 2.5 MG tablet Take 2.5 mg by mouth daily.    . brimonidine (ALPHAGAN) 0.15 % ophthalmic solution Place 1 drop into both eyes 2 (two) times daily.    . Ferrous Sulfate (IRON SUPPLEMENT PO) Take by mouth. Liquid med that is OTC    . glimepiride (AMARYL) 1 MG tablet Take 1 mg by mouth daily with breakfast.   6  . HYDROcodone-acetaminophen (NORCO/VICODIN) 5-325 MG per tablet Take 1-2 tablets by mouth every 4 (four) hours as needed for moderate pain or severe pain. 30 tablet 0  . levothyroxine (SYNTHROID, LEVOTHROID) 50 MCG tablet Take 50 mcg by mouth daily before breakfast.    . lidocaine-prilocaine (EMLA) cream Apply 1 application topically as needed. 30 g 6  . lisinopril (PRINIVIL,ZESTRIL) 40 MG tablet Take 40 mg by mouth daily.    Marland Kitchen LORazepam (ATIVAN) 0.5 MG tablet Take 1 tablet (0.5 mg total) by mouth at bedtime. 30 tablet 0  . loteprednol (LOTEMAX) 0.5 % ophthalmic suspension Place 1 drop into both eyes 4 (four) times daily.    . metFORMIN (GLUCOPHAGE) 500 MG tablet Take 500-1,000 mg by mouth 2 (two) times daily with a meal. Take 1000 mg  in the morning and 500 mg in the evening    . Misc Natural Products (OSTEO BI-FLEX JOINT SHIELD PO) Take 1 tablet by mouth daily.    . Multiple Vitamin (MULTIVITAMIN) tablet Take 1 tablet by mouth daily.    . ondansetron (ZOFRAN) 8 MG tablet Take 1 tablet (8 mg total) by mouth 2  (two) times daily. Start the day after chemo for 3 days. Then take as needed for nausea or vomiting. 30 tablet 1  . OVER THE COUNTER MEDICATION Take 1 tablet by mouth daily. sun chlorello    . OVER THE COUNTER MEDICATION Take 1 tablet by mouth 2 (two) times daily. Multi Nutrition Supplement    . Probiotic Product (PROBIOTIC DAILY PO) Take 1 tablet by mouth daily.    . prochlorperazine (COMPAZINE) 10 MG tablet Take 1 tablet (10 mg total) by mouth every 6 (six) hours as needed (Nausea or vomiting). 30 tablet 1  . UNABLE TO FIND 1 each by Other route as needed. 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    PHYSICAL EXAMINATION: ECOG PERFORMANCE STATUS: 1 - Symptomatic but completely ambulatory  Filed Vitals:   02/25/15 1130  BP: 148/59  Pulse: 83  Temp: 97.4 F (36.3 C)  Resp: 18   Filed Weights   02/25/15 1130  Weight: 155 lb (70.308 kg)    GENERAL:alert, no distress and comfortable SKIN: skin color, texture, turgor are normal, no rashes or significant lesions EYES: normal, Conjunctiva are pink and non-injected, sclera clear OROPHARYNX:no exudate, no erythema and lips, buccal mucosa, and tongue normal  NECK: supple, thyroid normal size, non-tender, without nodularity LYMPH:  no palpable lymphadenopathy in the cervical, axillary or inguinal LUNGS: clear to auscultation and percussion with normal breathing effort HEART: regular rate & rhythm and no murmurs and no lower extremity edema ABDOMEN:abdomen soft, non-tender and normal bowel sounds Musculoskeletal:no cyanosis of digits and no clubbing  NEURO: alert & oriented x 3 with fluent speech, no focal motor/sensory deficits  LABORATORY DATA:  I have reviewed the data as listed   Chemistry      Component Value Date/Time   NA 142 02/18/2015 1047   NA 140 11/25/2014 1346   K 5.2*  02/18/2015 1047   K 4.9 11/25/2014 1346   CL 105 11/25/2014 1346   CO2 26 02/18/2015 1047   CO2 26 11/25/2014 1346   BUN 17.9 02/18/2015 1047   BUN 17 11/25/2014 1346   CREATININE 1.1 02/18/2015 1047   CREATININE 1.02 11/25/2014 1346      Component Value Date/Time   CALCIUM 9.5 02/18/2015 1047   CALCIUM 9.6 11/25/2014 1346   ALKPHOS 71 02/18/2015 1047   ALKPHOS 83 06/26/2012 0200   AST 19 02/18/2015 1047   AST 22 06/26/2012 0200   ALT 13 02/18/2015 1047   ALT 13 06/26/2012 0200   BILITOT 0.27 02/18/2015 1047   BILITOT 0.2* 06/26/2012 0200       Lab Results  Component Value Date   WBC 3.4* 02/25/2015   HGB 10.1* 02/25/2015   HCT 31.7* 02/25/2015   MCV 94.1 02/25/2015   PLT 293 02/25/2015   NEUTROABS 1.5 02/25/2015    ASSESSMENT & PLAN:  Breast cancer of upper-inner quadrant of right female breast 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 ER 0%, PR 0%, HER-2 negative Ki-67 39%, 2.6 cm mass Right axillary lymph node biopsy positive  for invasive ductal carcinoma ER 0% PR 0% HER-2 negative Ki-67 43% Clinical stage: T2 N1 M0 stage IIB  Current treatment: Neoadjuvant chemotherapy with weekly Taxol and carboplatin 12 started 12/10/2014; today is week 10/12  Chemotherapy toxicities: Patient did not experience any side effects to chemotherapy. Mild constipation which improved with stool softeners. Patient is extremely happy with the staff at chemotherapy infusion. Did not have any nausea vomiting or neuropathy. 1. Neutropenia: After cycle 6: Dose reduced 2. Mild fatigue 3. Normocytic anemia:Patient has chronic normocytic anemia which has been even present prior to starting chemotherapy.this is stable  Monitoring closely for toxicities.  Plan:  1.  Completed chemotherapy  02/25/2015 2.  Breast MRI 03/04/2015 3.  Follow-up 03/05/2015 To review the MRI  4. Follow up with Dr.Hoxworth 03/14/15 (port can be removed at the same time)   No orders of the defined types were  placed in this encounter.   The patient has a good understanding of the overall plan. she agrees with it. she will call with any problems that may develop before the next visit here.   Rulon Eisenmenger, MD

## 2015-02-25 NOTE — Telephone Encounter (Signed)
Per pof cancel 6/1 lab and patient will get a new avs in chemo today

## 2015-02-26 ENCOUNTER — Ambulatory Visit: Payer: Medicare HMO

## 2015-03-04 ENCOUNTER — Ambulatory Visit
Admission: RE | Admit: 2015-03-04 | Discharge: 2015-03-04 | Disposition: A | Discharge status: Home / Self Care | Payer: Medicare HMO | Source: Ambulatory Visit | Attending: Hematology and Oncology | Admitting: Hematology and Oncology

## 2015-03-04 DIAGNOSIS — C50211 Malignant neoplasm of upper-inner quadrant of right female breast: Secondary | ICD-10-CM

## 2015-03-04 MED ORDER — GADOBENATE DIMEGLUMINE 529 MG/ML IV SOLN
14.0000 mL | Freq: Once | INTRAVENOUS | Status: AC | PRN
  Administered 2015-03-04: 14 mL via INTRAVENOUS

## 2015-03-04 NOTE — Assessment & Plan Note (Signed)
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 ER 0%, PR 0%, HER-2 negative Ki-67 39%, 2.6 cm mass Right axillary lymph node biopsy positive for invasive ductal carcinoma ER 0% PR 0% HER-2 negative Ki-67 43% Clinical stage: T2 N1 M0 stage IIB  Current treatment: Neoadjuvant chemotherapy with weekly Taxol and carboplatin 12 started 12/10/2014; today is week 10/12  Chemotherapy toxicities: Patient did not experience any side effects to chemotherapy. Mild constipation which improved with stool softeners. Patient is extremely happy with the staff at chemotherapy infusion. Did not have any nausea vomiting or neuropathy. 1. Neutropenia: After cycle 6: Dose reduced 2. Mild fatigue 3. Normocytic anemia:Patient has chronic normocytic anemia which has been even present prior to starting chemotherapy.this is stable  Monitoring closely for toxicities.  Plan:  1.  Completed chemotherapy  02/25/2015 2.  Breast MRI 03/04/2015 3.  Follow-up 03/05/2015 To review the MRI  4. Follow up with Dr.Hoxworth 03/14/15

## 2015-03-05 ENCOUNTER — Ambulatory Visit (HOSPITAL_BASED_OUTPATIENT_CLINIC_OR_DEPARTMENT_OTHER): Payer: Medicare HMO | Admitting: Hematology and Oncology

## 2015-03-05 ENCOUNTER — Other Ambulatory Visit: Payer: Medicare HMO

## 2015-03-05 VITALS — BP 127/68 | HR 80 | Temp 97.9°F | Resp 18 | Ht 62.0 in | Wt 154.9 lb

## 2015-03-05 DIAGNOSIS — C773 Secondary and unspecified malignant neoplasm of axilla and upper limb lymph nodes: Secondary | ICD-10-CM

## 2015-03-05 DIAGNOSIS — C50211 Malignant neoplasm of upper-inner quadrant of right female breast: Secondary | ICD-10-CM

## 2015-03-05 DIAGNOSIS — D649 Anemia, unspecified: Secondary | ICD-10-CM | POA: Diagnosis not present

## 2015-03-05 NOTE — Progress Notes (Signed)
Patient Care Team: Hulan Fess, MD as PCP - General Excell Seltzer, MD as Consulting Physician (General Surgery) Nicholas Lose, MD as Consulting Physician (Hematology and Oncology) Thea Silversmith, MD as Consulting Physician (Radiation Oncology) Rockwell Germany, RN as Registered Nurse Mauro Kaufmann, RN as Registered Nurse  DIAGNOSIS: Breast cancer of upper-inner quadrant of right female breast   Staging form: Breast, AJCC 7th Edition     Clinical stage from 11/20/2014: Stage IIB (T2, N1, M0) - Unsigned   SUMMARY OF ONCOLOGIC HISTORY:   Breast cancer of upper-inner quadrant of right female breast   10/22/2014 Mammogram Highly suspicious 2.6 cm mass in the upper inner quadrant of the right breast at the 2 o'clock , Pathologic right axillary lymph nodes, including a node in the mid axillary line with eccentric cortical thickening up to 5 mm   11/01/2014 Initial Biopsy Right breast biopsy 2:00: IDC with DCIS, ER/PR 0%, Ki-67 39%, HER-2 negative; right axillary lymph node biopsy: Positive for IDC ER/PR 0%, 43%, HER-2 Negative Ratio 0.94   12/10/2014 -  Neo-Adjuvant Chemotherapy Weekly Taxol and carboplatin 12    CHIEF COMPLIANT: Follow-up to discuss breast MRI  INTERVAL HISTORY: Brianna Valdez is a 81 year old with above-mentioned history of triple negative right breast cancer currently on the adjuvant chemotherapy with weekly Taxol and carboplatin. She completed 12 weeks of treatment and underwent a breast MRI and is here today to discuss the results. MRI showed that the size of the tumor has shrunk in size but not by very much. There is still significant non-mass enhancement. Patient has an appointment to see surgery on 03/14/2015. She does not have any residual side effects from chemotherapy.  REVIEW OF SYSTEMS:   Constitutional: Denies fevers, chills or abnormal weight loss Eyes: Denies blurriness of vision Ears, nose, mouth, throat, and face: Denies mucositis or sore  throat Respiratory: Denies cough, dyspnea or wheezes Cardiovascular: Denies palpitation, chest discomfort or lower extremity swelling Gastrointestinal:  Denies nausea, heartburn or change in bowel habits Skin: Denies abnormal skin rashes Lymphatics: Denies new lymphadenopathy or easy bruising Neurological:Denies numbness, tingling or new weaknesses Behavioral/Psych: Mood is stable, no new changes  Breast:  denies any pain or lumps or nodules in either breasts All other systems were reviewed with the patient and are negative.  I have reviewed the past medical history, past surgical history, social history and family history with the patient and they are unchanged from previous note.  ALLERGIES:  has No Known Allergies.  MEDICATIONS:  Current Outpatient Prescriptions  Medication Sig Dispense Refill  . amLODipine (NORVASC) 2.5 MG tablet Take 2.5 mg by mouth daily.    . brimonidine (ALPHAGAN) 0.15 % ophthalmic solution Place 1 drop into both eyes 2 (two) times daily.    . Ferrous Sulfate (IRON SUPPLEMENT PO) Take by mouth. Liquid med that is OTC    . glimepiride (AMARYL) 1 MG tablet Take 1 mg by mouth daily with breakfast.   6  . HYDROcodone-acetaminophen (NORCO/VICODIN) 5-325 MG per tablet Take 1-2 tablets by mouth every 4 (four) hours as needed for moderate pain or severe pain. 30 tablet 0  . levothyroxine (SYNTHROID, LEVOTHROID) 50 MCG tablet Take 50 mcg by mouth daily before breakfast.    . lidocaine-prilocaine (EMLA) cream Apply 1 application topically as needed. 30 g 6  . lisinopril (PRINIVIL,ZESTRIL) 40 MG tablet Take 40 mg by mouth daily.    Marland Kitchen LORazepam (ATIVAN) 0.5 MG tablet Take 1 tablet (0.5 mg total) by mouth at bedtime. 30 tablet  0  . loteprednol (LOTEMAX) 0.5 % ophthalmic suspension Place 1 drop into both eyes 4 (four) times daily.    . metFORMIN (GLUCOPHAGE) 500 MG tablet Take 500-1,000 mg by mouth 2 (two) times daily with a meal. Take 1000 mg in the morning and 500 mg in the  evening    . Misc Natural Products (OSTEO BI-FLEX JOINT SHIELD PO) Take 1 tablet by mouth daily.    . Multiple Vitamin (MULTIVITAMIN) tablet Take 1 tablet by mouth daily.    . ondansetron (ZOFRAN) 8 MG tablet Take 1 tablet (8 mg total) by mouth 2 (two) times daily. Start the day after chemo for 3 days. Then take as needed for nausea or vomiting. 30 tablet 1  . OVER THE COUNTER MEDICATION Take 1 tablet by mouth daily. sun chlorello    . OVER THE COUNTER MEDICATION Take 1 tablet by mouth 2 (two) times daily. Multi Nutrition Supplement    . Probiotic Product (PROBIOTIC DAILY PO) Take 1 tablet by mouth daily.    . prochlorperazine (COMPAZINE) 10 MG tablet Take 1 tablet (10 mg total) by mouth every 6 (six) hours as needed (Nausea or vomiting). 30 tablet 1  . UNABLE TO FIND 1 each by Other route as needed. 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    PHYSICAL EXAMINATION: ECOG PERFORMANCE STATUS: 0 - Asymptomatic  Filed Vitals:   03/05/15 1004  BP: 127/68  Pulse: 80  Temp: 97.9 F (36.6 C)  Resp: 18   Filed Weights   03/05/15 1004  Weight: 154 lb 14.4 oz (70.262 kg)    GENERAL:alert, no distress and comfortable SKIN: skin color, texture, turgor are normal, no rashes or significant lesions EYES: normal, Conjunctiva are pink and non-injected, sclera clear OROPHARYNX:no exudate, no erythema and lips, buccal mucosa, and tongue normal  NECK: supple, thyroid normal size, non-tender, without nodularity LYMPH:  no palpable lymphadenopathy in the cervical, axillary or inguinal LUNGS: clear to auscultation and percussion with normal breathing effort HEART: regular rate & rhythm and no murmurs and no lower extremity edema ABDOMEN:abdomen soft, non-tender and normal bowel sounds Musculoskeletal:no cyanosis of digits and no clubbing  NEURO: alert &  oriented x 3 with fluent speech, no focal motor/sensory deficits  LABORATORY DATA:  I have reviewed the data as listed   Chemistry      Component Value Date/Time   NA 141 02/25/2015 1107   NA 140 11/25/2014 1346   K 5.2* 02/25/2015 1107   K 4.9 11/25/2014 1346   CL 105 11/25/2014 1346   CO2 27 02/25/2015 1107   CO2 26 11/25/2014 1346   BUN 15.5 02/25/2015 1107   BUN 17 11/25/2014 1346   CREATININE 1.1 02/25/2015 1107   CREATININE 1.02 11/25/2014 1346      Component Value Date/Time   CALCIUM 9.2 02/25/2015 1107   CALCIUM 9.6 11/25/2014 1346   ALKPHOS 71 02/25/2015 1107   ALKPHOS 83 06/26/2012 0200   AST 16 02/25/2015 1107   AST 22 06/26/2012 0200   ALT 9 02/25/2015 1107   ALT 13 06/26/2012 0200   BILITOT 0.22 02/25/2015 1107   BILITOT 0.2* 06/26/2012 0200       Lab Results  Component Value Date   WBC 3.4* 02/25/2015   HGB 10.1* 02/25/2015   HCT 31.7* 02/25/2015   MCV 94.1 02/25/2015   PLT 293 02/25/2015   NEUTROABS  1.5 02/25/2015     RADIOGRAPHIC STUDIES: I have personally reviewed the radiology reports and agreed with their findings. Mr Breast Bilateral W Wo Contrast  03/05/2015   CLINICAL DATA:  Patient with history of right breast invasive ductal carcinoma status post chemotherapy.  EXAM: BILATERAL BREAST MRI WITH AND WITHOUT CONTRAST  TECHNIQUE: Multiplanar, multisequence MR images of both breasts were obtained prior to and following the intravenous administration of 14 ml of MultiHance.  THREE-DIMENSIONAL MR IMAGE RENDERING ON INDEPENDENT WORKSTATION:  Three-dimensional MR images were rendered by post-processing of the original MR data on an independent workstation. The three-dimensional MR images were interpreted, and findings are reported in the following complete MRI report for this study. Three dimensional images were evaluated at the independent DynaCad workstation  COMPARISON:  Previous examinations including breast MRI 11/15/2014. Additionally PET-CT  11/26/2014.  FINDINGS: Breast composition: c.  Heterogeneous fibroglandular tissue.  Background parenchymal enhancement: Mild  Right breast: There is a 2.1 x 2.0 x 2.3 cm irregular enhancing mass within the upper inner right breast posterior depth, decreased in size from prior were it measured 4.1 x 2.1 x 2.2 cm. There are a few adjacent enhancing nodules particularly along the posterior aspect which are slightly decreased when compared to prior examination. Previously described linear area of non mass enhancement along inferior anterior medial margin of the mass is grossly unchanged from prior. No additional concerning areas of enhancement are identified within the right breast. In total the mass and non mass enhancement measures approximately 3.6 cm.  Left breast: No suspicious areas of enhancement identified within the left breast.  Lymph nodes: No abnormal appearing lymph nodes.  Ancillary findings:  Left anterior chest wall Port-A-Cath.  IMPRESSION: Interval decrease in size of enhancing mass within the upper inner right breast posterior depth. Adjacent nodules and non mass enhancement are grossly similar when compared to prior exam.  These results were called by telephone at the time of interpretation on 03/05/2015 at 10:02 am to Dr. Nicholas Lose , who verbally acknowledged these results.  RECOMMENDATION: Treatment plan.  BI-RADS CATEGORY  6: Known biopsy-proven malignancy.   Electronically Signed   By: Lovey Newcomer M.D.   On: 03/05/2015 10:19     ASSESSMENT & PLAN:  Breast cancer of upper-inner quadrant of right female breast 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 ER 0%, PR 0%, HER-2 negative Ki-67 39%, 2.6 cm mass Right axillary lymph node biopsy positive for invasive ductal carcinoma ER 0% PR 0% HER-2 negative Ki-67 43% Clinical stage: T2 N1 M0 stage IIB Treatment summary: Neoadjuvant chemotherapy with weekly Taxol and carboplatin 12 started 12/10/2014 completed 02/25/2015 Chemotherapy  toxicities: Patient did not experience any side effects to chemotherapy. Mild constipation which improved with stool softeners. Patient is extremely happy with the staff at chemotherapy infusion. Did not have any nausea vomiting or neuropathy. Neutropenia: After cycle 6: Dose reduced, Mild fatigue, Normocytic anemia:Patient has chronic normocytic anemia which has been even present prior to starting chemotherapy.this is stable  Radiology review: Breast MRI done 03/04/2015 revealed decrease in size of the breast lump but not significantly. I reviewed the radiology report and showed her the pictures of before and after.  Patient has an appointment to see surgery on June 10. I will see her back after surgery to discuss the final pathology and for the treatment plan. She might get lumpectomy followed by adjuvant radiation therapy.  No orders of the defined types were placed in this encounter.   The patient has a  good understanding of the overall plan. she agrees with it. she will call with any problems that may develop before the next visit here.   Rulon Eisenmenger, MD

## 2015-03-14 ENCOUNTER — Other Ambulatory Visit: Payer: Self-pay | Admitting: General Surgery

## 2015-03-14 DIAGNOSIS — C50911 Malignant neoplasm of unspecified site of right female breast: Secondary | ICD-10-CM

## 2015-03-21 ENCOUNTER — Telehealth: Payer: Self-pay | Admitting: Hematology and Oncology

## 2015-03-21 ENCOUNTER — Other Ambulatory Visit: Payer: Self-pay | Admitting: General Surgery

## 2015-03-21 ENCOUNTER — Encounter: Payer: Self-pay | Admitting: *Deleted

## 2015-03-21 NOTE — Telephone Encounter (Signed)
Called and left a message with follow up appointment °

## 2015-04-01 ENCOUNTER — Encounter (HOSPITAL_COMMUNITY)
Admission: RE | Admit: 2015-04-01 | Discharge: 2015-04-01 | Disposition: A | Discharge status: Home / Self Care | Payer: Medicare HMO | Source: Ambulatory Visit | Attending: General Surgery | Admitting: General Surgery

## 2015-04-01 ENCOUNTER — Other Ambulatory Visit (HOSPITAL_COMMUNITY): Payer: Self-pay | Admitting: *Deleted

## 2015-04-01 ENCOUNTER — Other Ambulatory Visit (HOSPITAL_COMMUNITY): Payer: Medicare HMO

## 2015-04-01 ENCOUNTER — Encounter (HOSPITAL_COMMUNITY): Payer: Self-pay

## 2015-04-01 DIAGNOSIS — Z01812 Encounter for preprocedural laboratory examination: Secondary | ICD-10-CM | POA: Diagnosis not present

## 2015-04-01 DIAGNOSIS — C50911 Malignant neoplasm of unspecified site of right female breast: Secondary | ICD-10-CM | POA: Diagnosis not present

## 2015-04-01 LAB — BASIC METABOLIC PANEL
ANION GAP: 6 (ref 5–15)
BUN: 17 mg/dL (ref 6–20)
CALCIUM: 9.6 mg/dL (ref 8.9–10.3)
CO2: 29 mmol/L (ref 22–32)
CREATININE: 1.03 mg/dL — AB (ref 0.44–1.00)
Chloride: 106 mmol/L (ref 101–111)
GFR calc non Af Amer: 49 mL/min — ABNORMAL LOW (ref >60)
GFR, EST AFRICAN AMERICAN: 57 mL/min — AB (ref >60)
Glucose, Bld: 106 mg/dL — ABNORMAL HIGH (ref 65–99)
Potassium: 5.3 mmol/L — ABNORMAL HIGH (ref 3.5–5.1)
SODIUM: 141 mmol/L (ref 135–145)

## 2015-04-01 LAB — CBC
HEMATOCRIT: 34.2 % — AB (ref 36.0–46.0)
Hemoglobin: 10.9 g/dL — ABNORMAL LOW (ref 12.0–15.0)
MCH: 30.1 pg (ref 26.0–34.0)
MCHC: 31.9 g/dL (ref 30.0–36.0)
MCV: 94.5 fL (ref 78.0–100.0)
Platelets: 274 10*3/uL (ref 150–400)
RBC: 3.62 MIL/uL — AB (ref 3.87–5.11)
RDW: 14.4 % (ref 11.5–15.5)
WBC: 4.2 10*3/uL (ref 4.0–10.5)

## 2015-04-01 LAB — GLUCOSE, CAPILLARY: Glucose-Capillary: 126 mg/dL — ABNORMAL HIGH (ref 65–99)

## 2015-04-01 NOTE — Progress Notes (Signed)
Called Dr. Lennette Bihari Little's office to get most recent Hgb A1c, spoke with Tammy and last one was done 11/08/14. She will fax result to [place].

## 2015-04-01 NOTE — Pre-Procedure Instructions (Signed)
Brianna Valdez  04/01/2015     Your procedure is scheduled on Tuesday, April 08, 2015 at 11:30 AM.   Report to [facility] Entrance "A" Admitting Office at 9:30 AM.   Call this number if you have problems the morning of surgery: [phone]   Any questions prior to day of surgery, please call [phone] between 8 & 4 PM.    Remember:  Do not eat food or drink liquids after midnight Monday, 04/07/15.  Take these medicines the morning of surgery with A SIP OF WATER: Amlodipine (Norvasc), Levothyroxine (Synthroid), eye drops  Stop Multivitamins and Herbal medications as of today. Do NOT take any diabetic medications the morning of surgery.   Do not wear jewelry, make-up or nail polish.  Do not wear lotions, powders, or perfumes.  You may wear deodorant.  Do not shave 48 hours prior to surgery.    Do not bring valuables to the hospital.  [facility] is not responsible for any belongings or valuables.  Contacts, dentures or bridgework may not be worn into surgery.  Leave your suitcase in the car.  After surgery it may be brought to your room.  For patients admitted to the hospital, discharge time will be determined by your treatment team.  Patients discharged the day of surgery will not be allowed to drive home.   Special instructions: [place] - Preparing for Surgery  Before surgery, you can play an important role.  Because skin is not sterile, your skin needs to be as free of germs as possible.  You can reduce the number of germs on you skin by washing with CHG (chlorahexidine gluconate) soap before surgery.  CHG is an antiseptic cleaner which kills germs and bonds with the skin to continue killing germs even after washing.  Please DO NOT use if you have an allergy to CHG or antibacterial soaps.  If your skin becomes reddened/irritated stop using the CHG and inform your nurse when you arrive at Short Stay.  Do not shave (including legs and underarms) for at least  48 hours prior to the first CHG shower.  You may shave your face.  Please follow these instructions carefully:   1.  Shower with CHG Soap the night before surgery and the                                morning of Surgery.  2.  If you choose to wash your hair, wash your hair first as usual with your       normal shampoo.  3.  After you shampoo, rinse your hair and body thoroughly to remove the                      Shampoo.  4.  Use CHG as you would any other liquid soap.  You can apply chg directly       to the skin and wash gently with scrungie or a clean washcloth.  5.  Apply the CHG Soap to your body ONLY FROM THE NECK DOWN.        Do not use on open wounds or open sores.  Avoid contact with your eyes, ears, mouth and genitals (private parts).  Wash genitals (private parts) with your normal soap.  6.  Wash thoroughly, paying special attention to the area where your surgery        will be performed.  7.  Thoroughly rinse your body with warm water from the neck down.  8.  DO NOT shower/wash with your normal soap after using and rinsing off       the CHG Soap.  9.  Pat yourself dry with a clean towel.            10.  Wear clean pajamas.            11.  Place clean sheets on your bed the night of your first shower and do not        sleep with pets.  Day of Surgery  Do not apply any lotions the morning of surgery.  Please wear clean clothes to the hospital.    Please read over the following fact sheets that you were given. Pain Booklet, Coughing and Deep Breathing and Surgical Site Infection Prevention

## 2015-04-02 LAB — HEMOGLOBIN A1C
Hgb A1c MFr Bld: 6.7 % — ABNORMAL HIGH (ref 4.8–5.6)
Mean Plasma Glucose: 146 mg/dL

## 2015-04-07 MED ORDER — CEFAZOLIN SODIUM-DEXTROSE 2-3 GM-% IV SOLR
2.0000 g | INTRAVENOUS | Status: AC
  Administered 2015-04-08: 2 g via INTRAVENOUS
  Filled 2015-04-07: qty 50

## 2015-04-07 MED ORDER — CEFAZOLIN SODIUM-DEXTROSE 2-3 GM-% IV SOLR: 2.0000 g | INTRAVENOUS | Status: DC

## 2015-04-07 NOTE — H&P (Signed)
History of Present Illness Brianna Kitchen T. Brianna Boehm MD; 03/14/2015 5:49 PM) Patient words: breast f/u.  The patient is a 81 year old female who presents with breast cancer. 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 She tolerated her chemotherapy quite well. She recently had her postoperative MRI performed with results as below: Right breast: There is a 2.1 x 2.0 x 2.3 cm irregular enhancing mass within the upper inner right breast posterior depth, decreased in size from prior were it measured 4.1 x 2.1 x 2.2 cm. There are a few adjacent enhancing nodules particularly along the posterior aspect which are slightly decreased when compared to prior examination. Previously described linear area of non mass enhancement along inferior anterior medial margin of the mass is grossly unchanged from prior. No additional concerning areas of enhancement are identified within the right breast. In total the mass and non mass enhancement measures approximately 3.6 cm. Left breast: No suspicious areas of enhancement identified within the left breast. Lymph nodes: No abnormal appearing lymph nodes. She currently is feeling well without any specific complaints and ready to proceed with surgical planning   Allergies Brianna Valdez, CMA; 03/14/2015 3:53 PM) No Known Drug Allergies06/07/2015  Medication History (Brianna Valdez, CMA; 03/14/2015 3:55 PM) LORazepam (0.5MG  Tablet, Oral) Active. Alphagan P (0.15% Solution, Ophthalmic) Active. AmLODIPine Besylate (2.5MG  Tablet, Oral) Active. Glimepiride (1MG  Tablet, Oral) Active. Lisinopril (40MG  Tablet, Oral) Active. Lotemax (0.5% Gel, Ophthalmic) Active. MetFORMIN HCl (500MG  Tablet, Oral) Active. Synthroid (50MCG Tablet, Oral) Active. Lidocaine-Prilocaine (2.5-2.5%  Cream, External as needed) Active. Medications Reconciled Probiotic (Oral) Active. Ativan (0.5MG  Tablet, Oral) Active. Ferrous Sulfate CR (150MG  Capsule ER, Oral) Active.  Vitals (Brianna Valdez CMA; 03/14/2015 3:52 PM) 03/14/2015 3:52 PM Weight: 157 lb Height: 64in Body Surface Area: 1.79 m Body Mass Index: 26.95 kg/m Temp.: 61F(Temporal)  Pulse: 85 (Regular)  BP: 120/80 (Sitting, Left Arm, Standard)    Physical Exam Brianna Kitchen T. Brianna Kocher MD; 03/14/2015 5:50 PM) The physical exam findings are as follows: Note:General: Elderly F American female no distress Lymph nodes: No cervical, subclavicular or axillary nodes palpable Lungs: Clear equal breath sounds bilaterally Cardiac: Regular rate and rhythm without murmurs Breasts: Palpable mass upper inner right breast which feels to measure about 2-1/2 cm and is freely movable and feels somewhat smaller than at original presentation Abdomen: Soft and nontender without organomegaly Neurologic: Alert and oriented. Normal thought content and affect.    Assessment & Plan Brianna Kitchen T. Brianna Segal MD; 03/14/2015 5:54 PM) BREAST CANCER, RIGHT (174.9  C50.911) Impression: 81 year old female with recent diagnosis of T2, N1 triple nest.ive invasive ductal carcinoma of the right breast. She has completed adjuvant chemotherapy with some reduction in tumor size and resolution of suspicious appearing nodes. I discussed these findings in detail with her. We discussed surgical options in detail including lumpectomy versus total mastectomy. I believe she would be a candidate for lumpectomy understanding that she might have some deformity in the medial part of the breast. She definitely wants to try to proceed with lumpectomy. I discussed that we would need to obtain negative margins and final pathology results could indicate further breast surgery up to and including mastectomy. She understands and agrees. In terms of her lymph nodes we discussed  that standard guidelines would call for axillary dissection as she had node-positive disease at the time of presentation. I discussed in detail  the Alliance trial where she could be randomized to no axillary dissection if she had a negative sentinel lymph node biopsy at the time of surgery. We discussed the nature of axillary dissection and some increased risk of lymphedema. After a long discussion and good questions from the patient she told me that she did not want her axillary lymph nodes were removed if evaluation with sentinel lymph node biopsy were negative. She states she did not want normal lymph nodes removed. For this reason she would not want to participate in the Alliance trial. She understands that this is not currently standard treatment for her axillary disease but I think at age 65 and as she very clearly understands the issues involved and is making informed choice that this is reasonable. Therefore the end of our discussion she would like to proceed with sentinel lymph node biopsy with removal of several axillary nodes and if this is negative on frozen section to not proceed with axillary dissection but if we do find positive nodes to proceed with axillary dissection Current Plans  Schedule for Surgery Right breast lumpectomy and right axillary sentinel lymph node biopsy with possible axillary dissection

## 2015-04-08 ENCOUNTER — Encounter (HOSPITAL_COMMUNITY): Payer: Self-pay | Admitting: *Deleted

## 2015-04-08 ENCOUNTER — Observation Stay (HOSPITAL_COMMUNITY)
Admission: RE | Admit: 2015-04-08 | Discharge: 2015-04-09 | Disposition: A | Discharge status: Home / Self Care | Payer: Medicare HMO | Source: Ambulatory Visit | Attending: General Surgery | Admitting: General Surgery

## 2015-04-08 ENCOUNTER — Encounter (HOSPITAL_COMMUNITY)
Admission: RE | Disposition: A | Discharge status: Home / Self Care | Payer: Self-pay | Source: Ambulatory Visit | Attending: General Surgery

## 2015-04-08 ENCOUNTER — Ambulatory Visit (HOSPITAL_COMMUNITY): Payer: Medicare HMO | Admitting: Anesthesiology

## 2015-04-08 ENCOUNTER — Ambulatory Visit (HOSPITAL_COMMUNITY)
Admission: RE | Admit: 2015-04-08 | Discharge: 2015-04-08 | Disposition: A | Discharge status: Home / Self Care | Payer: Medicare HMO | Source: Ambulatory Visit | Attending: General Surgery | Admitting: General Surgery

## 2015-04-08 DIAGNOSIS — E119 Type 2 diabetes mellitus without complications: Secondary | ICD-10-CM | POA: Insufficient documentation

## 2015-04-08 DIAGNOSIS — C50911 Malignant neoplasm of unspecified site of right female breast: Principal | ICD-10-CM | POA: Insufficient documentation

## 2015-04-08 DIAGNOSIS — I10 Essential (primary) hypertension: Secondary | ICD-10-CM | POA: Insufficient documentation

## 2015-04-08 DIAGNOSIS — C50919 Malignant neoplasm of unspecified site of unspecified female breast: Secondary | ICD-10-CM | POA: Diagnosis present

## 2015-04-08 DIAGNOSIS — Z9221 Personal history of antineoplastic chemotherapy: Secondary | ICD-10-CM | POA: Diagnosis not present

## 2015-04-08 DIAGNOSIS — C50211 Malignant neoplasm of upper-inner quadrant of right female breast: Secondary | ICD-10-CM

## 2015-04-08 HISTORY — PX: PORT-A-CATH REMOVAL: SHX5289

## 2015-04-08 HISTORY — PX: BREAST LUMPECTOMY WITH SENTINEL LYMPH NODE BIOPSY: SHX5597

## 2015-04-08 LAB — GLUCOSE, CAPILLARY
GLUCOSE-CAPILLARY: 152 mg/dL — AB (ref 65–99)
GLUCOSE-CAPILLARY: 146 mg/dL — AB (ref 65–99)
Glucose-Capillary: 86 mg/dL (ref 65–99)
Glucose-Capillary: 145 mg/dL — ABNORMAL HIGH (ref 65–99)

## 2015-04-08 SURGERY — BREAST LUMPECTOMY WITH SENTINEL LYMPH NODE BX
Anesthesia: General | Site: Chest | Laterality: Right

## 2015-04-08 MED ORDER — PROPOFOL 10 MG/ML IV BOLUS
INTRAVENOUS | Status: DC | PRN
  Administered 2015-04-08: 160 mg via INTRAVENOUS

## 2015-04-08 MED ORDER — CHLORHEXIDINE GLUCONATE 4 % EX LIQD: 1.0000 "application " | Freq: Once | CUTANEOUS | Status: DC

## 2015-04-08 MED ORDER — FENTANYL CITRATE (PF) 100 MCG/2ML IJ SOLN
INTRAMUSCULAR | Status: AC
  Filled 2015-04-08: qty 2

## 2015-04-08 MED ORDER — BUPIVACAINE-EPINEPHRINE (PF) 0.25% -1:200000 IJ SOLN
INTRAMUSCULAR | Status: AC
  Filled 2015-04-08: qty 30

## 2015-04-08 MED ORDER — OXYCODONE HCL 5 MG PO TABS
ORAL_TABLET | ORAL | Status: AC
  Filled 2015-04-08: qty 1

## 2015-04-08 MED ORDER — MIDAZOLAM HCL 2 MG/2ML IJ SOLN
2.0000 mg | INTRAMUSCULAR | Status: DC | PRN
  Administered 2015-04-08: 2 mg via INTRAVENOUS

## 2015-04-08 MED ORDER — FENTANYL CITRATE (PF) 250 MCG/5ML IJ SOLN
INTRAMUSCULAR | Status: AC
  Filled 2015-04-08: qty 5

## 2015-04-08 MED ORDER — ONDANSETRON HCL 4 MG PO TABS: 4.0000 mg | ORAL_TABLET | Freq: Four times a day (QID) | ORAL | Status: DC | PRN

## 2015-04-08 MED ORDER — PHENYLEPHRINE 40 MCG/ML (10ML) SYRINGE FOR IV PUSH (FOR BLOOD PRESSURE SUPPORT)
PREFILLED_SYRINGE | INTRAVENOUS | Status: AC
  Filled 2015-04-08: qty 10

## 2015-04-08 MED ORDER — LIDOCAINE HCL (CARDIAC) 20 MG/ML IV SOLN
INTRAVENOUS | Status: DC | PRN
  Administered 2015-04-08: 60 mg via INTRAVENOUS
  Administered 2015-04-08: 40 mg via INTRAVENOUS

## 2015-04-08 MED ORDER — BRIMONIDINE TARTRATE 0.15 % OP SOLN
1.0000 [drp] | Freq: Two times a day (BID) | OPHTHALMIC | Status: DC
  Administered 2015-04-08 – 2015-04-09 (×2): 1 [drp] via OPHTHALMIC
  Filled 2015-04-08: qty 5

## 2015-04-08 MED ORDER — FENTANYL CITRATE (PF) 100 MCG/2ML IJ SOLN
INTRAMUSCULAR | Status: DC | PRN
  Administered 2015-04-08: 25 ug via INTRAVENOUS
  Administered 2015-04-08: 50 ug via INTRAVENOUS

## 2015-04-08 MED ORDER — TECHNETIUM TC 99M SULFUR COLLOID FILTERED
1.0000 | Freq: Once | INTRAVENOUS | Status: AC | PRN
Start: 2015-04-08 — End: 2015-04-08
  Administered 2015-04-08: 1 via INTRADERMAL

## 2015-04-08 MED ORDER — POTASSIUM CHLORIDE IN NACL 20-0.9 MEQ/L-% IV SOLN
INTRAVENOUS | Status: DC
  Administered 2015-04-08: 17:00:00 via INTRAVENOUS
  Filled 2015-04-08: qty 1000

## 2015-04-08 MED ORDER — BUPIVACAINE-EPINEPHRINE (PF) 0.5% -1:200000 IJ SOLN
INTRAMUSCULAR | Status: AC
  Filled 2015-04-08: qty 30

## 2015-04-08 MED ORDER — ONDANSETRON HCL 4 MG/2ML IJ SOLN
INTRAMUSCULAR | Status: DC | PRN
  Administered 2015-04-08: 4 mg via INTRAVENOUS

## 2015-04-08 MED ORDER — LISINOPRIL 40 MG PO TABS
40.0000 mg | ORAL_TABLET | Freq: Every day | ORAL | Status: DC
  Administered 2015-04-08: 40 mg via ORAL
  Filled 2015-04-08: qty 2

## 2015-04-08 MED ORDER — OXYCODONE HCL 5 MG PO TABS
5.0000 mg | ORAL_TABLET | Freq: Once | ORAL | Status: AC | PRN
  Administered 2015-04-08: 5 mg via ORAL

## 2015-04-08 MED ORDER — METHYLENE BLUE 1 % INJ SOLN
INTRAMUSCULAR | Status: AC
  Filled 2015-04-08: qty 10

## 2015-04-08 MED ORDER — PROPOFOL 10 MG/ML IV BOLUS
INTRAVENOUS | Status: AC
  Filled 2015-04-08: qty 20

## 2015-04-08 MED ORDER — HEPARIN SODIUM (PORCINE) 5000 UNIT/ML IJ SOLN
5000.0000 [IU] | Freq: Three times a day (TID) | INTRAMUSCULAR | Status: DC
  Administered 2015-04-08 – 2015-04-09 (×2): 5000 [IU] via SUBCUTANEOUS
  Filled 2015-04-08 (×2): qty 1

## 2015-04-08 MED ORDER — LACTATED RINGERS IV SOLN
INTRAVENOUS | Status: DC
  Administered 2015-04-08 (×2): via INTRAVENOUS

## 2015-04-08 MED ORDER — ONDANSETRON HCL 4 MG/2ML IJ SOLN: 4.0000 mg | Freq: Four times a day (QID) | INTRAMUSCULAR | Status: DC | PRN

## 2015-04-08 MED ORDER — ONDANSETRON HCL 4 MG/2ML IJ SOLN
INTRAMUSCULAR | Status: AC
  Filled 2015-04-08: qty 2

## 2015-04-08 MED ORDER — MORPHINE SULFATE 2 MG/ML IJ SOLN
2.0000 mg | INTRAMUSCULAR | Status: DC | PRN
  Administered 2015-04-08: 2 mg via INTRAVENOUS
  Filled 2015-04-08: qty 1

## 2015-04-08 MED ORDER — INSULIN ASPART 100 UNIT/ML ~~LOC~~ SOLN
0.0000 [IU] | Freq: Three times a day (TID) | SUBCUTANEOUS | Status: DC
  Administered 2015-04-08: 2 [IU] via SUBCUTANEOUS

## 2015-04-08 MED ORDER — SODIUM CHLORIDE 0.9 % IJ SOLN
INTRAMUSCULAR | Status: DC | PRN
  Administered 2015-04-08: 5 mL via INTRAMUSCULAR

## 2015-04-08 MED ORDER — FENTANYL CITRATE (PF) 100 MCG/2ML IJ SOLN
25.0000 ug | INTRAMUSCULAR | Status: DC | PRN
  Administered 2015-04-08 (×3): 25 ug via INTRAVENOUS
  Administered 2015-04-08: 50 ug via INTRAVENOUS
  Administered 2015-04-08: 25 ug via INTRAVENOUS

## 2015-04-08 MED ORDER — OXYCODONE HCL 5 MG/5ML PO SOLN: 5.0000 mg | Freq: Once | ORAL | Status: AC | PRN

## 2015-04-08 MED ORDER — ONDANSETRON HCL 4 MG/2ML IJ SOLN: 4.0000 mg | Freq: Once | INTRAMUSCULAR | Status: DC | PRN

## 2015-04-08 MED ORDER — SODIUM CHLORIDE 0.9 % IJ SOLN
INTRAMUSCULAR | Status: AC
  Filled 2015-04-08: qty 10

## 2015-04-08 MED ORDER — LIDOCAINE HCL (CARDIAC) 20 MG/ML IV SOLN
INTRAVENOUS | Status: AC
  Filled 2015-04-08: qty 5

## 2015-04-08 MED ORDER — MIDAZOLAM HCL 2 MG/2ML IJ SOLN
INTRAMUSCULAR | Status: AC
  Filled 2015-04-08: qty 2

## 2015-04-08 MED ORDER — FENTANYL CITRATE (PF) 100 MCG/2ML IJ SOLN
100.0000 ug | INTRAMUSCULAR | Status: DC | PRN
  Administered 2015-04-08: 100 ug via INTRAVENOUS

## 2015-04-08 MED ORDER — HYDROCODONE-ACETAMINOPHEN 5-325 MG PO TABS
1.0000 | ORAL_TABLET | ORAL | Status: DC | PRN
  Administered 2015-04-08: 2 via ORAL
  Administered 2015-04-09: 1 via ORAL
  Filled 2015-04-08: qty 1
  Filled 2015-04-08: qty 2

## 2015-04-08 MED ORDER — LEVOTHYROXINE SODIUM 50 MCG PO TABS
50.0000 ug | ORAL_TABLET | Freq: Every day | ORAL | Status: DC
  Administered 2015-04-09: 50 ug via ORAL
  Filled 2015-04-08: qty 1

## 2015-04-08 MED ORDER — AMLODIPINE BESYLATE 2.5 MG PO TABS: 2.5000 mg | ORAL_TABLET | Freq: Every day | ORAL | Status: DC

## 2015-04-08 MED ORDER — BUPIVACAINE-EPINEPHRINE 0.25% -1:200000 IJ SOLN
INTRAMUSCULAR | Status: DC | PRN
  Administered 2015-04-08: 19 mL

## 2015-04-08 SURGICAL SUPPLY — 64 items
APPLIER CLIP 9.375 MED OPEN (MISCELLANEOUS) ×3
APPLIER CLIP 9.375 SM OPEN (CLIP) ×3
APR CLP MED 9.3 20 MLT OPN (MISCELLANEOUS) ×2
APR CLP SM 9.3 20 MLT OPN (CLIP) ×2
BINDER BREAST XLRG (GAUZE/BANDAGES/DRESSINGS) ×2 IMPLANT
CHLORAPREP W/TINT 10.5 ML (MISCELLANEOUS) ×1 IMPLANT
CHLORAPREP W/TINT 26ML (MISCELLANEOUS) ×3 IMPLANT
CLIP APPLIE 9.375 MED OPEN (MISCELLANEOUS) ×1 IMPLANT
CLIP APPLIE 9.375 SM OPEN (CLIP) ×1 IMPLANT
CONT SPEC 4OZ CLIKSEAL STRL BL (MISCELLANEOUS) ×4 IMPLANT
COVER PROBE W GEL 5X96 (DRAPES) ×2 IMPLANT
COVER SURGICAL LIGHT HANDLE (MISCELLANEOUS) ×3 IMPLANT
DRAIN CHANNEL 19F RND (DRAIN) ×1 IMPLANT
DRAPE CHEST BREAST 15X10 FENES (DRAPES) ×3 IMPLANT
DRAPE PED LAPAROTOMY (DRAPES) ×2 IMPLANT
DRAPE UTILITY XL STRL (DRAPES) ×5 IMPLANT
ELECT CAUTERY BLADE 6.4 (BLADE) ×3 IMPLANT
ELECT COATED BLADE 2.86 ST (ELECTRODE) ×1 IMPLANT
ELECT REM PT RETURN 9FT ADLT (ELECTROSURGICAL) ×3
ELECTRODE REM PT RTRN 9FT ADLT (ELECTROSURGICAL) ×2 IMPLANT
EVACUATOR SILICONE 100CC (DRAIN) ×3 IMPLANT
GAUZE SPONGE 4X4 12PLY STRL (GAUZE/BANDAGES/DRESSINGS) ×2 IMPLANT
GAUZE SPONGE 4X4 16PLY XRAY LF (GAUZE/BANDAGES/DRESSINGS) ×3 IMPLANT
GLOVE BIO SURGEON STRL SZ 6.5 (GLOVE) ×4 IMPLANT
GLOVE BIO SURGEON STRL SZ7 (GLOVE) ×2 IMPLANT
GLOVE BIOGEL PI IND STRL 6.5 (GLOVE) ×2 IMPLANT
GLOVE BIOGEL PI IND STRL 7.0 (GLOVE) ×2 IMPLANT
GLOVE BIOGEL PI IND STRL 8 (GLOVE) ×2 IMPLANT
GLOVE BIOGEL PI INDICATOR 6.5 (GLOVE) ×2
GLOVE BIOGEL PI INDICATOR 7.0 (GLOVE) ×2
GLOVE BIOGEL PI INDICATOR 8 (GLOVE) ×1
GLOVE ECLIPSE 7.5 STRL STRAW (GLOVE) ×4 IMPLANT
GOWN STRL REUS W/ TWL LRG LVL3 (GOWN DISPOSABLE) ×4 IMPLANT
GOWN STRL REUS W/ TWL XL LVL3 (GOWN DISPOSABLE) ×2 IMPLANT
GOWN STRL REUS W/TWL LRG LVL3 (GOWN DISPOSABLE) ×6
GOWN STRL REUS W/TWL XL LVL3 (GOWN DISPOSABLE) ×3
KIT BASIN OR (CUSTOM PROCEDURE TRAY) ×3 IMPLANT
KIT MARKER MARGIN INK (KITS) ×2 IMPLANT
KIT ROOM TURNOVER OR (KITS) ×3 IMPLANT
LIQUID BAND (GAUZE/BANDAGES/DRESSINGS) ×3 IMPLANT
NDL 18GX1X1/2 (RX/OR ONLY) (NEEDLE) IMPLANT
NDL HYPO 25GX1X1/2 BEV (NEEDLE) ×2 IMPLANT
NEEDLE 18GX1X1/2 (RX/OR ONLY) (NEEDLE) ×3 IMPLANT
NEEDLE HYPO 25GX1X1/2 BEV (NEEDLE) ×6 IMPLANT
NS IRRIG 1000ML POUR BTL (IV SOLUTION) ×3 IMPLANT
PACK SURGICAL SETUP 50X90 (CUSTOM PROCEDURE TRAY) ×3 IMPLANT
PAD ARMBOARD 7.5X6 YLW CONV (MISCELLANEOUS) ×5 IMPLANT
PENCIL BUTTON HOLSTER BLD 10FT (ELECTRODE) ×3 IMPLANT
SPECIMEN JAR MEDIUM (MISCELLANEOUS) ×4 IMPLANT
SPONGE LAP 4X18 X RAY DECT (DISPOSABLE) ×3 IMPLANT
STAPLER VISISTAT 35W (STAPLE) ×2 IMPLANT
SUT ETHILON 2 0 FS 18 (SUTURE) ×1 IMPLANT
SUT MON AB 4-0 PC3 18 (SUTURE) ×5 IMPLANT
SUT VIC AB 3-0 54X BRD REEL (SUTURE) IMPLANT
SUT VIC AB 3-0 BRD 54 (SUTURE) ×3
SUT VIC AB 3-0 SH 18 (SUTURE) ×4 IMPLANT
SUT VIC AB 3-0 SH 27 (SUTURE) ×9
SUT VIC AB 3-0 SH 27XBRD (SUTURE) ×4 IMPLANT
SYR BULB 3OZ (MISCELLANEOUS) ×2 IMPLANT
SYR CONTROL 10ML LL (SYRINGE) ×5 IMPLANT
TOWEL OR 17X24 6PK STRL BLUE (TOWEL DISPOSABLE) ×2 IMPLANT
TOWEL OR 17X26 10 PK STRL BLUE (TOWEL DISPOSABLE) ×3 IMPLANT
TUBE CONNECTING 12X1/4 (SUCTIONS) ×1 IMPLANT
YANKAUER SUCT BULB TIP NO VENT (SUCTIONS) ×2 IMPLANT

## 2015-04-08 NOTE — Transfer of Care (Signed)
Immediate Anesthesia Transfer of Care Note  Patient: Brianna Valdez  Procedure(s) Performed: Procedure(s): RIGHT BREAST LUMPECTOMY WITH RIGHT AXILLARY SENTINEL LYMPH NODE BIOPSY (Right) REMOVAL PORT-A-CATH LEFT CHEST (Left)  Patient Location: PACU  Anesthesia Type:General  Level of Consciousness: sedated  Airway & Oxygen Therapy: Patient Spontanous Breathing and Patient connected to face mask oxygen  Post-op Assessment: Report given to RN, Post -op Vital signs reviewed and stable and Patient moving all extremities  Post vital signs: Reviewed and stable  Last Vitals:  Filed Vitals:   04/08/15 1120  BP: 114/41  Pulse: 85  Temp:   Resp: 10    Complications: No apparent anesthesia complications

## 2015-04-08 NOTE — Anesthesia Postprocedure Evaluation (Signed)
  Anesthesia Post-op Note  Patient: Orthoptist  Procedure(s) Performed: Procedure(s): RIGHT BREAST LUMPECTOMY WITH RIGHT AXILLARY SENTINEL LYMPH NODE BIOPSY (Right) REMOVAL PORT-A-CATH LEFT CHEST (Left)  Patient Location: PACU  Anesthesia Type:General and GA combined with regional for post-op pain  Level of Consciousness: awake, alert  and oriented  Airway and Oxygen Therapy: Patient Spontanous Breathing  Post-op Pain: mild  Post-op Assessment: Post-op Vital signs reviewed, Patient's Cardiovascular Status Stable, Respiratory Function Stable, Patent Airway and Pain level controlled              Post-op Vital Signs: stable  Last Vitals:  Filed Vitals:   04/08/15 1616  BP: 142/46  Pulse: 80  Temp: 36.8 C  Resp: 16    Complications: No apparent anesthesia complications

## 2015-04-08 NOTE — Op Note (Signed)
Preoperative Diagnosis: right breast cancer  Postoprative Diagnosis: right breast cancer  Procedure: Procedure(s): RIGHT BREAST LUMPECTOMY, BLUE DYE INJECTION RIGHT BREAST, RIGHT AXILLARY SENTINEL LYMPH NODE BIOPSY, RIGHT AXILLARY DISSECTION, REMOVAL PORT-A-CATH LEFT CHEST   Surgeon: Excell Seltzer T   Assistants: None  Anesthesia:  General LMA anesthesia  Indications: Patient was diagnosed several months ago with locally advanced cancer of the right breast, T2 N1 triple negative with an approximately 3.5 cm primary tumor and biopsy positive enlarged lymph node in the right axilla. She has undergone neoadjuvant treatment with shrinkage of her primary tumor to about 2.2 cm and resolution of abnormal-appearing lymph nodes on MRI. No palpable adenopathy. After extensive discussion with the patient and family regarding treatment options we have elected to proceed with right breast lumpectomy and right axillary sentinel lymph node biopsy with possible right axillary dissection if lymph node involvement is detected. Also plan to remove Port-A-Cath. We discussed the nature of the surgery and indications and risks extensively detailed elsewhere.    Procedure Detail:  Preoperatively the patient underwent injection of 1 mCi of technetium sulfur colloid intradermally around the right nipple in the holding area. She underwent regional block by anesthesia. She was taken operating room, placed in supine position on the operating table, and laryngeal mask general anesthesia induced. She received preoperative IV antibiotics. PAS were in place.  After prepping the right breast patient timeout was performed and correct procedure verified. 5 mL of diluted methylene blue was injected subcutaneous tissues beneath the right nipple and massaged for several minutes. Following this the entire right breast and chest, axilla and upper arm as well as left anterior chest were widely sterilely prepped and draped. The right  axillary sentinel lymph node biopsy was approached initially. A hot area in the right axilla was identified and a small transverse incision made and dissection carried down through the saphenous tissue. The clavipectoral fascia was incised. Using the neoprobe for guidance several lymph nodes with high counts and blue dye were identified. 4 total lymph nodes were removed all with blue dye and elevated counts at which point there were no significant counts in the axilla and no palpable nodes or evidence of further blue dye. These were sent for immediate diagnosis. All waiting for this result attention was turned to the lumpectomy. The mass was easily palpable in the upper inner quadrant of the right breast. I made a transversely oriented incision in a skin fold overlying the mass and dissection was carried down into the subcutaneous tissue. Then staying away from the palpable area in normal surrounding fatty and breast tissue a generous lumpectomy was performed around the mass with cautery. Posteriorly the fascia was taken off of the muscle with the specimen. It was grossly completely excised with a normal rim of tissue. This was oriented with ink and sent for permanent pathology. The wound was thoroughly irrigated. Hemostasis was obtained. Breast tissue was mobilized off the chest wall superiorly and inferiorly for closure. Interrupted 3-0 Vicryl was used to close breast and subcutaneous tissue and the skin was closed with subcuticular 4-0 Monocryl. At this point report returned as at least one of the sentinel lymph nodes positive for metastatic carcinoma. As planned I proceeded with axillary dissection. The axillary incision was lengthened. Dissection was carried down Through the subcutaneous tissue. Dissection was deepened down to the tail of Spence of breast tissue and axillary contents separated from the talus (with cautery down to the chest wall.  The dissection was deepened posteriorly to  the anterior border of  latissimus which was dissected free and clearly identified. Medially dissection was deepened down to the edge of the pectoralis major and this was retracted medially and the pectoralis minor identified and clavipectoral fascia incised. The pectoralis minor was retracted medially and the axillary vein was identified early in the dissection and carefully protected. Beginning at the medial edge of the pectoralis minor all fibrofatty tissue inferior to the axillary vein was swept laterally and inferiorly. Perforating branches of the axillary artery and vein were divided between clips. The second intercostal nerve was divided and taken with the specimen. As the axillary contents were swept inferiorly and laterally the thoracodorsal vessels and nerves were identified and carefully protected. The long thoracic nerve was identified along the medial chest wall and carefully protected. All tissue between these 2 structures down to the subscapularis muscle was swept inferiorly. Protecting the 2 nerves final attachments along the serratus and along the latissimus were divided and the specimen removed. The wound was irrigated thoroughly and completely hemostasis assured. A 19 Blake closed suction drain was left through a separate stab wound in the axilla. The subcutaneous tissue was closed with interrupted 3-0 Vicryl and the skin with staples. Following this the Port-A-Cath incision on the left anterior chest wall was elliptically excised and dissection carried down through subcutaneous tissue to the port. The capsule was opened and the catheter was withdrawn intact. The port and the 2 sutures were completely removed along with the intact catheter. The subcutaneous was closed with interrupted 3-0 Vicryl and the skin was particular 4-0 Monocryl and Dermabond. Sponge needle and instrument counts were correct.    Findings: As above  Estimated Blood Loss:  less than 50 mL         Drains: 19 Blake drain in right  axilla  Blood Given: none          Specimens: Right axillary hot blue sentinel lymph nodes X 4      Right breast lumpectomy            And right axillary contents        Complications:  * No complications entered in OR log *         Disposition: PACU - hemodynamically stable.         Condition: stable

## 2015-04-08 NOTE — Anesthesia Preprocedure Evaluation (Addendum)
Anesthesia Evaluation  Patient identified by MRN, date of birth, ID band Patient awake    Reviewed: Allergy & Precautions, NPO status , Patient's Chart, lab work & pertinent test results  Airway Mallampati: II   Neck ROM: Full    Dental  (+) Teeth Intact, Dental Advisory Given   Pulmonary  breath sounds clear to auscultation        Cardiovascular hypertension, Pt. on medications Rhythm:Regular Rate:Normal     Neuro/Psych    GI/Hepatic   Endo/Other  diabetes, Type 1  Renal/GU      Musculoskeletal   Abdominal   Peds  Hematology   Anesthesia Other Findings   Reproductive/Obstetrics                            Anesthesia Physical Anesthesia Plan  ASA: III  Anesthesia Plan: General   Post-op Pain Management:    Induction: Intravenous  Airway Management Planned: LMA  Additional Equipment:   Intra-op Plan:   Post-operative Plan: Extubation in OR  Informed Consent: I have reviewed the patients History and Physical, chart, labs and discussed the procedure including the risks, benefits and alternatives for the proposed anesthesia with the patient or authorized representative who has indicated his/her understanding and acceptance.   Dental advisory given  Plan Discussed with: Anesthesiologist  Anesthesia Plan Comments:         Anesthesia Quick Evaluation

## 2015-04-08 NOTE — Progress Notes (Addendum)
Pt admitted to 6N18 via stretcher from PACU.  Pt AAO X4.  Pt on RA.  Pt has 18G Lt hand with fluids infusing.  Pt has incision to rt chest with dry dsg.  JP to rt chest with bloody drainage.  Pt has incision to lt chest with dermabond.  SCDs in place. Report rcvd from [place], [place]. Pt has no questions at the moment.  Will continue to monitor.

## 2015-04-08 NOTE — Interval H&P Note (Signed)
History and Physical Interval Note:  04/08/2015 11:24 AM  Brianna Valdez  has presented today for surgery, with the diagnosis of RIGHT BREAST CANCER  The various methods of treatment have been discussed with the patient and family. After consideration of risks, benefits and other options for treatment, the patient has consented to  Procedure(s): RIGHT BREAST LUMPECTOMY WITH RIGHT AXILLARY SENTINEL LYMPH NODE BIOPSY (Right) REMOVAL PORT-A-CATH LEFT CHEST (Left) as a surgical intervention .  The patient's history has been reviewed, patient examined, no change in status, stable for surgery.  I have reviewed the patient's chart and labs.  Questions were answered to the patient's satisfaction.     [name]

## 2015-04-08 NOTE — Anesthesia Procedure Notes (Addendum)
Procedure Name: LMA Insertion Date/Time: 04/08/2015 12:27 PM Performed by: Rush Farmer E Pre-anesthesia Checklist: Patient identified, Emergency Drugs available, Suction available, Patient being monitored and Timeout performed Patient Re-evaluated:Patient Re-evaluated prior to inductionOxygen Delivery Method: Circle system utilized Preoxygenation: Pre-oxygenation with 100% oxygen Intubation Type: IV induction Ventilation: Mask ventilation without difficulty LMA: LMA inserted LMA Size: 4.0 Number of attempts: 1 Placement Confirmation: positive ETCO2 and breath sounds checked- equal and bilateral Tube secured with: Tape Dental Injury: Teeth and Oropharynx as per pre-operative assessment  Comments: LMA inserted by Ariel Holtzman SRNA   Anesthesia Regional Block:  Pectoralis block  Pre-Anesthetic Checklist: ,, timeout performed, Correct Patient, Correct Site, Correct Laterality, Correct Procedure, Correct Position, site marked, Risks and benefits discussed,  Surgical consent,  Pre-op evaluation,  At surgeon's request and post-op pain management  Laterality: Right  Prep: chloraprep       Needles:  Injection technique: Single-shot  Needle Type: Echogenic Stimulator Needle     Needle Length: 9cm 9 cm Needle Gauge: 22 and 22 G    Additional Needles:  Procedures: ultrasound guided (picture in chart) Pectoralis block Narrative:  Start time: 04/08/2015 12:15 PM End time: 04/08/2015 12:20 PM Injection made incrementally with aspirations every 5 mL.  Performed by: Personally   Additional Notes: 30 cc 0.5% bupivacaine with 1:200 Epi injected easily

## 2015-04-09 ENCOUNTER — Encounter (HOSPITAL_COMMUNITY): Payer: Self-pay | Admitting: General Surgery

## 2015-04-09 DIAGNOSIS — C50911 Malignant neoplasm of unspecified site of right female breast: Secondary | ICD-10-CM | POA: Diagnosis not present

## 2015-04-09 LAB — CBC
HCT: 33.6 % — ABNORMAL LOW (ref 36.0–46.0)
Hemoglobin: 10.5 g/dL — ABNORMAL LOW (ref 12.0–15.0)
MCH: 29.7 pg (ref 26.0–34.0)
MCHC: 31.3 g/dL (ref 30.0–36.0)
MCV: 95.2 fL (ref 78.0–100.0)
PLATELETS: 281 10*3/uL (ref 150–400)
RBC: 3.53 MIL/uL — AB (ref 3.87–5.11)
RDW: 14 % (ref 11.5–15.5)
WBC: 4.7 10*3/uL (ref 4.0–10.5)

## 2015-04-09 LAB — BASIC METABOLIC PANEL
Anion gap: 8 (ref 5–15)
BUN: 12 mg/dL (ref 6–20)
CALCIUM: 8.8 mg/dL — AB (ref 8.9–10.3)
CO2: 27 mmol/L (ref 22–32)
Chloride: 108 mmol/L (ref 101–111)
Creatinine, Ser: 0.99 mg/dL (ref 0.44–1.00)
GFR, EST AFRICAN AMERICAN: 60 mL/min — AB (ref >60)
GFR, EST NON AFRICAN AMERICAN: 52 mL/min — AB (ref >60)
Glucose, Bld: 104 mg/dL — ABNORMAL HIGH (ref 65–99)
POTASSIUM: 4.6 mmol/L (ref 3.5–5.1)
Sodium: 143 mmol/L (ref 135–145)

## 2015-04-09 LAB — GLUCOSE, CAPILLARY
GLUCOSE-CAPILLARY: 126 mg/dL — AB (ref 65–99)
Glucose-Capillary: 116 mg/dL — ABNORMAL HIGH (ref 65–99)

## 2015-04-09 MED ORDER — HYDROCODONE-ACETAMINOPHEN 5-325 MG PO TABS: 1.0000 | ORAL_TABLET | ORAL | Status: DC | PRN

## 2015-04-09 NOTE — Progress Notes (Addendum)
Discharge instructions gone over with patient and family . Home medications gone over. Prescription given. Diet, activity, bowel regimen, incisional care, and arm precautions discussed. JP drain care gone over. Follow up appointments are made. Reasons to call the doctor gone over. Patient's daughter is a Marine scientist and will be assisting in her care at home. Patient verbalized understanding of instructions.

## 2015-04-09 NOTE — Discharge Summary (Signed)
Patient ID: Brianna Valdez [id number] 81 y.o. May 12, 1931  04/08/2015  Discharge date and time: 04/09/2015   Admitting Physician: Excell Seltzer T  Discharge Physician: Excell Seltzer T  Admission Diagnoses: right breast cancer  Discharge Diagnoses:Same  Operations: Procedure(s): RIGHT BREAST LUMPECTOMY WITH RIGHT AXILLARY The total lymph node biopsy and Lymph node dissection REMOVAL PORT-A-CATH LEFT CHEST  Admission Condition: good  Discharged Condition: good  Indication for Admission: Patient has a diagnosis of T2 N1 triple negative invasive cancer of the upper inner right breast, status post neoadjuvant chemotherapy with some shrinkage of her primary tumor and shrinkage of biopsy positive lymph nodes. She is electively admitted for right breast lumpectomy with axillary sentinel lymph node biopsy and possible axillary dissection and removal of her Port-A-Cath.  Hospital Course: Patient underwent the above procedure with positive axillary sentinel lymph node and axillary dissection. Following day she is doing well. Good pain control. No complaints. Incisions clean and dry. She is felt ready for discharge.  Disposition: Home  Patient Instructions:    Medication List    TAKE these medications        amLODipine 2.5 MG tablet  Commonly known as:  NORVASC  Take 2.5 mg by mouth daily.     brimonidine 0.15 % ophthalmic solution  Commonly known as:  ALPHAGAN  Place 1 drop into both eyes 2 (two) times daily.     glimepiride 1 MG tablet  Commonly known as:  AMARYL  Take 1 mg by mouth daily with breakfast.     HYDROcodone-acetaminophen 5-325 MG per tablet  Commonly known as:  NORCO/VICODIN  Take 1-2 tablets by mouth every 4 (four) hours as needed for moderate pain.     levothyroxine 50 MCG tablet  Commonly known as:  SYNTHROID, LEVOTHROID  Take 50 mcg by mouth daily before breakfast.     lisinopril 40 MG tablet  Commonly known as:  PRINIVIL,ZESTRIL  Take 40  mg by mouth daily.     LUTEIN PO  Take 25 mg by mouth daily.     Magnesium 250 MG Tabs  Take 250 mg by mouth daily with supper.     metFORMIN 500 MG tablet  Commonly known as:  GLUCOPHAGE  Take 500-1,000 mg by mouth 2 (two) times daily with a meal. Take 2 tablets (1000 mg) with breakfast and 1 tablet (500 mg) with supper     multivitamin with minerals Tabs tablet  Take by mouth 2 (two) times daily. 1 packet of Daily Advantage Vitamins twice daily (minus the fish oil capsule)     OSTEO BI-FLEX JOINT SHIELD PO  Take 1 tablet by mouth daily with supper.     OVER THE COUNTER MEDICATION  Take 1 tablet by mouth daily with breakfast. Blood Builder     OVER THE COUNTER MEDICATION  Take 1 tablet by mouth daily with breakfast. Neuro Support Plus     OVER THE COUNTER MEDICATION  Take 1 tablet by mouth at bedtime as needed (sleep). Pure Sleep     OVER THE COUNTER MEDICATION  Sun chlorella Takes 4-6 tabs every morning after breakfast     PROBIOTIC DAILY PO  Take 1 tablet by mouth daily with supper. Probiotic Advantage Ultra 20     UNABLE TO FIND  1 each by Other route as needed. 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 Excell Seltzer in 1 week.  Signed: Edward Jolly MD, FACS  04/09/2015, 7:29 AM

## 2015-04-09 NOTE — Discharge Instructions (Signed)
[facility] Office Phone Number [phone]  BREAST BIOPSY/ PARTIAL MASTECTOMY: POST OP INSTRUCTIONS  Always review your discharge instruction sheet given to you by the facility where your surgery was performed.  IF YOU HAVE DISABILITY OR FAMILY LEAVE FORMS, YOU MUST BRING THEM TO THE OFFICE FOR PROCESSING.  DO NOT GIVE THEM TO YOUR DOCTOR.  1. A prescription for pain medication may be given to you upon discharge.  Take your pain medication as prescribed, if needed.  If narcotic pain medicine is not needed, then you may take acetaminophen (Tylenol) or ibuprofen (Advil) as needed. 2. Take your usually prescribed medications unless otherwise directed 3. If you need a refill on your pain medication, please contact your pharmacy.  They will contact our office to request authorization.  Prescriptions will not be filled after 5pm or on week-ends. 4. You should eat very light the first 24 hours after surgery, such as soup, crackers, pudding, etc.  Resume your normal diet the day after surgery. 5. Most patients will experience some swelling and bruising in the breast.  Ice packs and a good support bra will help.  Swelling and bruising can take several days to resolve.  6. It is common to experience some constipation if taking pain medication after surgery.  Increasing fluid intake and taking a stool softener will usually help or prevent this problem from occurring.  A mild laxative (Milk of Magnesia or Miralax) should be taken according to package directions if there are no bowel movements after 48 hours. 7. Unless discharge instructions indicate otherwise, you may remove your bandages 24-48 hours after surgery, and you may shower at that time.  You may have steri-strips (small skin tapes) in place directly over the incision.  These strips should be left on the skin for 7-10 days.  If your surgeon used skin glue on the incision, you may shower in 24 hours.  The glue will flake off over the  next 2-3 weeks.  Any sutures or staples will be removed at the office during your follow-up visit. 8. ACTIVITIES:  You may resume regular daily activities (gradually increasing) beginning the next day.  Wearing a good support bra or sports bra minimizes pain and swelling.  You may have sexual intercourse when it is comfortable. a. You may drive when you no longer are taking prescription pain medication, you can comfortably wear a seatbelt, and you can safely maneuver your car and apply brakes. b. RETURN TO WORK:  ______________________________________________________________________________________ 9. You should see your doctor in the office for a follow-up appointment approximately two weeks after your surgery.  Your doctors nurse will typically make your follow-up appointment when she calls you with your pathology report.  Expect your pathology report 2-3 business days after your surgery.  You may call to check if you do not hear from [place] after three days. 10. OTHER INSTRUCTIONS: ___May remove bandage under arm in 24 hours and shower. Keep Band-Aid over drain  site.____________________________________________________________________________________________________ _____________________________________________________________________________________________________________________________________ _____________________________________________________________________________________________________________________________________  WHEN TO CALL YOUR DOCTOR: 1. Fever over 101.0 2. Nausea and/or vomiting. 3. Extreme swelling or bruising. 4. Continued bleeding from incision. 5. Increased pain, redness, or drainage from the incision.  The clinic staff is available to answer your questions during regular business hours.  Please dont hesitate to call and ask to speak to one of the nurses for clinical concerns.  If you have a medical emergency, go to the nearest emergency room or call 911.  A surgeon from  [facility] is  always on call at the hospital. ° °For further questions, please visit [email or web address]  °

## 2015-04-09 NOTE — Progress Notes (Signed)
Patient ID: Brianna Valdez, female   DOB: September 17, 1931, 81 y.o.   MRN: [id number] 1 Day Post-Op  Subjective: No complaints this morning. Mild pain well controlled.  Objective: Vital signs in last 24 hours: Temp:  [97.1 F (36.2 C)-98.7 F (37.1 C)] 98.2 F (36.8 C) (07/06 0645) Pulse Rate:  [65-89] 75 (07/06 0645) Resp:  [8-24] 16 (07/06 0645) BP: (114-155)/(41-87) 155/60 mmHg (07/06 0645) SpO2:  [95 %-100 %] 97 % (07/06 0645) Weight:  [70.217 kg (154 lb 12.8 oz)-70.308 kg (155 lb)] 70.217 kg (154 lb 12.8 oz) (07/05 1616) Last BM Date: 04/09/15  Intake/Output from previous day: 07/05 0701 - 07/06 0700 In: 1140 [P.O.:240; I.V.:900] Out: 2220 [Urine:2100; Drains:95; Blood:25] Intake/Output this shift:    General appearance: alert, cooperative and no distress Incision/Wound: incisions without swelling or drainage. Axillary dressing dry and intact without swelling. JP drainage serosanguineous in appropriate amounts.  Lab Results:   Recent Labs  04/09/15 0436  WBC 4.7  HGB 10.5*  HCT 33.6*  PLT 281   BMET  Recent Labs  04/09/15 0436  NA 143  K 4.6  CL 108  CO2 27  GLUCOSE 104*  BUN 12  CREATININE 0.99  CALCIUM 8.8*     Studies/Results: Nm Sentinel Node Inj-no Rpt (breast)  04/08/2015   CLINICAL DATA: cancer right breast   Sulfur colloid was injected intradermally by the nuclear medicine  technologist for breast cancer sentinel node localization.     Anti-infectives: Anti-infectives    Start     Dose/Rate Route Frequency Ordered Stop   04/08/15 1100  ceFAZolin (ANCEF) IVPB 2 g/50 mL premix     2 g 100 mL/hr over 30 Minutes Intravenous On call to O.R. 04/07/15 1302 04/08/15 1226   04/08/15 0600  ceFAZolin (ANCEF) IVPB 2 g/50 mL premix  Status:  Discontinued     2 g 100 mL/hr over 30 Minutes Intravenous On call to O.R. 04/07/15 1301 04/07/15 1302      Assessment/Plan: s/p Procedure(s): RIGHT BREAST LUMPECTOMY WITH RIGHT AXILLARY Dissection REMOVAL  PORT-A-CATH LEFT CHEST Doing well postoperatively without complication. I discussed surgical findings with the patient and her family. Wound and drain care were explained. Okay for discharge.       Elyna Pangilinan T 04/09/2015

## 2015-04-10 ENCOUNTER — Encounter (HOSPITAL_COMMUNITY): Payer: Self-pay | Admitting: General Surgery

## 2015-04-16 ENCOUNTER — Ambulatory Visit (HOSPITAL_BASED_OUTPATIENT_CLINIC_OR_DEPARTMENT_OTHER): Payer: Medicare HMO | Admitting: Hematology and Oncology

## 2015-04-16 ENCOUNTER — Telehealth: Payer: Self-pay | Admitting: Hematology and Oncology

## 2015-04-16 ENCOUNTER — Encounter: Payer: Self-pay | Admitting: Hematology and Oncology

## 2015-04-16 VITALS — BP 121/60 | HR 76 | Temp 98.1°F | Resp 18 | Ht 62.0 in | Wt 156.2 lb

## 2015-04-16 DIAGNOSIS — C50211 Malignant neoplasm of upper-inner quadrant of right female breast: Secondary | ICD-10-CM | POA: Diagnosis not present

## 2015-04-16 DIAGNOSIS — C773 Secondary and unspecified malignant neoplasm of axilla and upper limb lymph nodes: Secondary | ICD-10-CM

## 2015-04-16 NOTE — Telephone Encounter (Signed)
Gave avs & calendar for January °

## 2015-04-16 NOTE — Assessment & Plan Note (Signed)
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 ER 0%, PR 0%, HER-2 negative Ki-67 39%, 2.6 cm mass Right axillary lymph node biopsy positive for invasive ductal carcinoma ER 0% PR 0% HER-2 negative Ki-67 43% Clinical stage: T2 N1 M0 stage IIB Treatment summary: Neoadjuvant chemotherapy with weekly Taxol and carboplatin 12 started 12/10/2014 completed 02/25/2015 Right lumpectomy 04/08/2015 : Multifocal invasive ductal carcinoma 2 foci 2.5 cm, 0.5 cm with DCIS, margins negative closes 0.1 cm, 1/33 lymph nodes positive T2 N1 a M0 stage IIB pathologic stage ------------------------------------------------------------------------------------------------------------------------------------------------------------  Pathology counseling: I discussed the final pathology report and provided her with a copy of this result. Final pathologic stage is T2 N1 stage IIB.   Treatment plan: 1.  Adjuvant radiation therapy 2.  Followed by surveillance.   Return to clinic in 3 months for follow-up blood work

## 2015-04-16 NOTE — Progress Notes (Signed)
Patient Care Team: Hulan Fess, MD as PCP - General Excell Seltzer, MD as Consulting Physician (General Surgery) Nicholas Lose, MD as Consulting Physician (Hematology and Oncology) Thea Silversmith, MD as Consulting Physician (Radiation Oncology) Rockwell Germany, RN as Registered Nurse Mauro Kaufmann, RN as Registered Nurse  DIAGNOSIS: Breast cancer of upper-inner quadrant of right female breast   Staging form: Breast, AJCC 7th Edition     Clinical stage from 11/20/2014: Stage IIB (T2, N1, M0) - Unsigned   SUMMARY OF ONCOLOGIC HISTORY:   Breast cancer of upper-inner quadrant of right female breast   10/22/2014 Mammogram Highly suspicious 2.6 cm mass in the upper inner quadrant of the right breast at the 2 o'clock , Pathologic right axillary lymph nodes, including a node in the mid axillary line with eccentric cortical thickening up to 5 mm   11/01/2014 Initial Biopsy Right breast biopsy 2:00: IDC with DCIS, ER/PR 0%, Ki-67 39%, HER-2 negative; right axillary lymph node biopsy: Positive for IDC ER/PR 0%, 43%, HER-2 Negative Ratio 0.94   12/10/2014 - 02/25/2015 Neo-Adjuvant Chemotherapy Weekly Taxol and carboplatin 12   04/08/2015 Surgery Right lumpectomy: Multifocal invasive ductal carcinoma 2 foci 2.5 cm, 0.5 cm with DCIS, margins negative closes 0.1 cm, 1/33 lymph nodes positive T2 N1 a M0 stage IIB pathologic stage    CHIEF COMPLIANT:  Follow-up after surgery  INTERVAL HISTORY: Brianna Valdez is a  81 year old with above-mentioned history of right breast cancer triple negative disease who underwent neoadjuvant chemotherapy with weekly Taxol and carboplatin. She underwent lumpectomy on 04/08/2015 and is here today to discuss the pathology report. She is doing quite well from surgery standpoint. Denies any major pain or discomfort in the breast but somewhat sore and underneath the arm. She had full axillary lymph node dissection which revealed one positive lymph node.  REVIEW OF SYSTEMS:    Constitutional: Denies fevers, chills or abnormal weight loss Eyes: Denies blurriness of vision Ears, nose, mouth, throat, and face: Denies mucositis or sore throat Respiratory: Denies cough, dyspnea or wheezes Cardiovascular: Denies palpitation, chest discomfort or lower extremity swelling Gastrointestinal:  Denies nausea, heartburn or change in bowel habits Skin: Denies abnormal skin rashes Lymphatics:  Soreness underneath the arm Neurological:Denies numbness, tingling or new weaknesses Behavioral/Psych: Mood is stable, no new changes  Breast: discomfort from recent surgery All other systems were reviewed with the patient and are negative.  I have reviewed the past medical history, past surgical history, social history and family history with the patient and they are unchanged from previous note.  ALLERGIES:  has No Known Allergies.  MEDICATIONS:  Current Outpatient Prescriptions  Medication Sig Dispense Refill  . amLODipine (NORVASC) 2.5 MG tablet Take 2.5 mg by mouth daily.    . brimonidine (ALPHAGAN) 0.15 % ophthalmic solution Place 1 drop into both eyes 2 (two) times daily.    Marland Kitchen glimepiride (AMARYL) 1 MG tablet Take 1 mg by mouth daily with breakfast.   6  . HYDROcodone-acetaminophen (NORCO/VICODIN) 5-325 MG per tablet Take 1-2 tablets by mouth every 4 (four) hours as needed for moderate pain. 40 tablet 0  . levothyroxine (SYNTHROID, LEVOTHROID) 50 MCG tablet Take 50 mcg by mouth daily before breakfast.    . lisinopril (PRINIVIL,ZESTRIL) 40 MG tablet Take 40 mg by mouth daily.    . LUTEIN PO Take 25 mg by mouth daily.    . Magnesium 250 MG TABS Take 250 mg by mouth daily with supper.    . metFORMIN (GLUCOPHAGE) 500 MG tablet Take  500-1,000 mg by mouth 2 (two) times daily with a meal. Take 2 tablets (1000 mg) with breakfast and 1 tablet (500 mg) with supper    . Misc Natural Products (OSTEO BI-FLEX JOINT SHIELD PO) Take 1 tablet by mouth daily with supper.     . Multiple Vitamin  (MULTIVITAMIN WITH MINERALS) TABS tablet Take by mouth 2 (two) times daily. 1 packet of Daily Advantage Vitamins twice daily (minus the fish oil capsule)    . OVER THE COUNTER MEDICATION Take 1 tablet by mouth daily with breakfast. Blood Builder    . OVER THE COUNTER MEDICATION Take 1 tablet by mouth daily with breakfast. Neuro Support Plus    . OVER THE COUNTER MEDICATION Take 1 tablet by mouth at bedtime as needed (sleep). Pure Sleep    . OVER THE COUNTER MEDICATION Sun chlorella Takes 4-6 tabs every morning after breakfast    . Probiotic Product (PROBIOTIC DAILY PO) Take 1 tablet by mouth daily with supper. Probiotic Advantage Ultra 20    . UNABLE TO FIND 1 each by Other route as needed. 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    PHYSICAL EXAMINATION: ECOG PERFORMANCE STATUS: 1 - Symptomatic but completely ambulatory  Filed Vitals:   04/16/15 1357  BP: 121/60  Pulse: 76  Temp: 98.1 F (36.7 C)  Resp: 18   Filed Weights   04/16/15 1357  Weight: 156 lb 3.2 oz (70.852 kg)    GENERAL:alert, no distress and comfortable SKIN: skin color, texture, turgor are normal, no rashes or significant lesions EYES: normal, Conjunctiva are pink and non-injected, sclera clear OROPHARYNX:no exudate, no erythema and lips, buccal mucosa, and tongue normal  NECK: supple, thyroid normal size, non-tender, without nodularity LYMPH:  no palpable lymphadenopathy in the cervical, axillary or inguinal LUNGS: clear to auscultation and percussion with normal breathing effort HEART: regular rate & rhythm and no murmurs and no lower extremity edema ABDOMEN:abdomen soft, non-tender and normal bowel sounds Musculoskeletal:no cyanosis of digits and no clubbing  NEURO: alert & oriented x 3 with fluent speech, no focal motor/sensory deficits   LABORATORY DATA:  I  have reviewed the data as listed   Chemistry      Component Value Date/Time   NA 143 04/09/2015 0436   NA 141 02/25/2015 1107   K 4.6 04/09/2015 0436   K 5.2* 02/25/2015 1107   CL 108 04/09/2015 0436   CO2 27 04/09/2015 0436   CO2 27 02/25/2015 1107   BUN 12 04/09/2015 0436   BUN 15.5 02/25/2015 1107   CREATININE 0.99 04/09/2015 0436   CREATININE 1.1 02/25/2015 1107      Component Value Date/Time   CALCIUM 8.8* 04/09/2015 0436   CALCIUM 9.2 02/25/2015 1107   ALKPHOS 71 02/25/2015 1107   ALKPHOS 83 06/26/2012 0200   AST 16 02/25/2015 1107   AST 22 06/26/2012 0200   ALT 9 02/25/2015 1107   ALT 13 06/26/2012 0200   BILITOT 0.22 02/25/2015 1107   BILITOT 0.2* 06/26/2012 0200       Lab Results  Component Value Date   WBC 4.7 04/09/2015   HGB 10.5* 04/09/2015   HCT 33.6* 04/09/2015   MCV 95.2 04/09/2015   PLT 281 04/09/2015   NEUTROABS 1.5 02/25/2015   ASSESSMENT & PLAN:  Breast cancer of upper-inner quadrant of right female breast 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  ER 0%, PR 0%, HER-2 negative Ki-67 39%, 2.6 cm mass Right axillary lymph node biopsy positive for invasive ductal carcinoma ER 0% PR 0% HER-2 negative Ki-67 43% Clinical stage: T2 N1 M0 stage IIB Treatment summary: Neoadjuvant chemotherapy with weekly Taxol and carboplatin 12 started 12/10/2014 completed 02/25/2015 Right lumpectomy 04/08/2015 : Multifocal invasive ductal carcinoma 2 foci 2.5 cm, 0.5 cm with DCIS, margins negative closes 0.1 cm, 1/33 lymph nodes positive T2 N1 a M0 stage IIB pathologic stage ------------------------------------------------------------------------------------------------------------------------------------------------------------  Pathology counseling: I discussed the final pathology report and provided her with a copy of this result. Final pathologic stage is T2 N1 stage IIB.   Treatment plan: 1.  Adjuvant radiation therapy 2.  Followed by surveillance.   Return  to clinic in 6 months for follow-up blood work  No orders of the defined types were placed in this encounter.   The patient has a good understanding of the overall plan. she agrees with it. she will call with any problems that may develop before the next visit here.   Rulon Eisenmenger, MD

## 2015-04-16 NOTE — Addendum Note (Signed)
Addended by: Prentiss Bells on: 04/16/2015 04:08 PM   Modules accepted: Orders, Medications

## 2015-04-21 NOTE — Progress Notes (Signed)
Location of Breast Cancer:Right breast upper-inner quadrant 2.6 cm  Histology per Pathology Report:04/08/15 Diagnosis 1. Lymph node, sentinel, biopsy, Right axillary #1 - ONE BENIGN LYMPH NODE WITH NO TUMOR SEEN (0/1). 2. Lymph node, sentinel, biopsy, Right axillary #2 - ONE LYMPH NODE POSITIVE FOR METASTATIC DUCTAL CARCINOMA (1/1). 3. Lymph node, sentinel, biopsy, Right axillary #3 - ONE BENIGN LYMPH NODE WITH NO TUMOR SEEN (0/1). 4. Lymph node, sentinel, biopsy, Right axillary #4 - ONE BENIGN LYMPH NODE WITH NO TUMOR SEEN (0/1). 5. Breast, lumpectomy, Right - MULTIFOCAL INVASIVE DUCTAL CARCINOMA, TWO FOCI SPANNING 2.5 CM AND 0.5 CM IN GREATEST DIMENSION. - DUCTAL CARCINOMA IN SITU IS PRESENT. - INVASIVE TUMOR IS LESS THAN 0.1 CM FROM MEDIAL MARGIN. - INVASIVE DUCTAL CARCINOMA IS LESS THAN 0.1 CM FROM POSTERIOR MARGIN. - OTHER MARGINS ARE NEGATIVE. - SEE ONCOLOGY TEMPLATE. 6. Lymph nodes, regional resection, Right axillary - TWENTY-NINE BENIGN LYMPH NODES WITH NO TUMOR SEEN (0/29).  Receptor Status: ER(-), PR (-), Her2-neu (-) Triple negative Did patient present with symptoms (if so, please note symptoms) or was this found on screening mammography?: Found on routine yearly mammogram. Past/Anticipated interventions by surgeon, if OIZ:TIWPY lumpectomy 04/08/15.  Past/Anticipated interventions by medical oncology, if any: Chemotherapy recommendation is weekly taxol with carboplatin x 12.  Lymphedema issues, if any: No  Pain issues, if KDX:IPJAS arm soreness  SAFETY ISSUES:  Prior radiation? No  Pacemaker/ICD?No   Possible current pregnancy? post-menopausal  Is the patient on methotrexate? No  Current Complaints / other details:GXP4.first live birth age 67.post-menopausal.Unsure about menarche.No HRT.Has 3 sons and 1 daughter. No family history of breast cancer or female cancer. NKDA    Arlyss Repress, RN 04/21/2015,3:01 PM

## 2015-04-23 ENCOUNTER — Ambulatory Visit
Admission: RE | Admit: 2015-04-23 | Discharge: 2015-04-23 | Disposition: A | Discharge status: Home / Self Care | Payer: Medicare HMO | Source: Ambulatory Visit | Attending: Radiation Oncology | Admitting: Radiation Oncology

## 2015-04-23 ENCOUNTER — Encounter: Payer: Self-pay | Admitting: Radiation Oncology

## 2015-04-23 VITALS — BP 127/69 | HR 72 | Temp 97.6°F | Wt 154.2 lb

## 2015-04-23 DIAGNOSIS — C50212 Malignant neoplasm of upper-inner quadrant of left female breast: Secondary | ICD-10-CM

## 2015-04-23 DIAGNOSIS — Z510 Encounter for antineoplastic radiation therapy: Secondary | ICD-10-CM | POA: Insufficient documentation

## 2015-04-23 DIAGNOSIS — C50211 Malignant neoplasm of upper-inner quadrant of right female breast: Secondary | ICD-10-CM | POA: Diagnosis present

## 2015-04-23 NOTE — Progress Notes (Signed)
Department of Radiation Oncology  Phone:  [phone] Fax:        [phone]   Name: Brianna Valdez MRN: [id number]  DOB: 14-Aug-1931  Date: 04/23/2015  Follow Up Visit Note  Diagnosis: Breast cancer of upper-inner quadrant of right female breast   Staging form: Breast, AJCC 7th Edition     Clinical stage from 11/20/2014: Stage IIB (T2, N1, M0) - Unsigned     Pathologic: Stage IIB (T2(2), N1a, cM0) - Signed by Nicholas Lose, MD on 04/16/2015   Interval History: Brianna Valdez presents today for routine followup. She completed weekly Taxol and Carboplatin and underwent a lumpectomy and axillary node dissection on July 5th, 2016. This showed multifocal invasive ductal carcinoma measuring 2.5cm and 0.5cm with close but negative margins. She had one out of thirty three lymph nodes positive. She was triple negative. She has recovered well since her surgery and is ready to begin radiation. "I had no problem with chemo. I think I'm doing good and my surgeon thinks so too. The movement is fine (of her arm), but my arm hurts," Brianna Valdez stated when asked about her recovery experience from chemotherapy and surgery. "I can't describe this pain, it's unusual," Brianna Valdez vocalized when questioned about her pain, but was able to successfully place her arm above and behind her head with no issues. She was excited about her family reunion occuring this week, especially to see her grandchildren. She has experienced a death in the family this week, but projects a healthy mental status.  Physical Exam:  Filed Vitals:   04/23/15 1428  BP: 127/69  Pulse: 72  Temp: 97.6 F (36.4 C)  Weight: 154 lb 3.2 oz (69.945 kg)  Her weight is currently stable and she is alert and oriented X3. Her incision is well healed and she has good range of motion in her left breast.   IMPRESSION: Brianna Valdez is a 81 y.o. female presenting to clinic in regards to her T2N1 cancer of the right female breast.  PLAN: I spoke to the  patient today regarding her diagnosis and options for treatment. We discussed the equivalence in terms of survival and local failure between mastectomy and breast conservation. We discussed the role of radiation in decreasing local failures in patients who undergo lumpectomy. We discussed the process of simulation and the placement tattoos. We discussed 6 weeks of treatment as an outpatient. We discussed the possibility of asymptomatic lung damage. We discussed the low likelihood of secondary malignancies. We discussed the possible side effects including but not limited to skin redness, fatigue, permanent skin darkening, and breast swelling. We went into detail about the process of radiation and how radiation effects the body to help educate the patient and her husband, as per her request. We discussed relevant studies that involved patients that have underwent radiation, statistics yielding these studies, and the benefits patients will receive provided by research via these studies.  We will be treating her breast as well as her supraclavicular fossa and high axillary lymph nodes. She will be scheduled for simulation the first week of August and expected to begin treatment the second week of August. She will also be referred for our after breast cancer class with physical therapy. Brianna Valdez and her husband were made aware of the dates and benefits of this physical therapy class. If the physical therapists express are further concerned about the pain in Brianna Valdez's arm, she was advised seek further medical treatment before symptoms worsen. The ABC class if attended as soon as possible,  should improve the symptoms she is experiencing currently in her left arm. Radiation treatments will start one month after her surgery date. Donnarae and her husband were both made aware of  her planning session to take place the first week of August on August 4th, 2016 at 1:00pm. Radiation treatments are to follow, the second week of  August. An appointment with Dr. Excell Seltzer is to take place on July 29th, 2016.  This document serves as a record of services personally performed by Thea Silversmith , MD. It was created on her behalf by Lenn Cal, a trained medical scribe. The creation of this record is based on the scribe's personal observations and the provider's statements to them. This document has been checked and approved by the attending provider.   _______________________________________  Thea Silversmith, MD

## 2015-05-06 ENCOUNTER — Encounter: Payer: Self-pay | Admitting: *Deleted

## 2015-05-06 MED ORDER — UNABLE TO FIND: 1.0000 | Status: AC | PRN

## 2015-05-06 NOTE — Progress Notes (Signed)
Spoke to pt concerning needing compression sleeve. Prescription will be ready for the pt when she arrives for [facility] on 05/08/15. Pt also ask if she can take omega 3 supplements again. Informed pt that we will get recommendations from Dr. Lindi Adie and call her back. Received verbal understanding.

## 2015-05-08 ENCOUNTER — Ambulatory Visit
Admission: RE | Admit: 2015-05-08 | Discharge: 2015-05-08 | Disposition: A | Discharge status: Home / Self Care | Payer: Medicare HMO | Source: Ambulatory Visit | Attending: Radiation Oncology | Admitting: Radiation Oncology

## 2015-05-08 DIAGNOSIS — Z510 Encounter for antineoplastic radiation therapy: Secondary | ICD-10-CM | POA: Diagnosis not present

## 2015-05-08 DIAGNOSIS — C50212 Malignant neoplasm of upper-inner quadrant of left female breast: Secondary | ICD-10-CM

## 2015-05-08 NOTE — Progress Notes (Signed)
Name: Jaid Quirion   MRN: [id number]  Date:  05/08/2015  DOB: 1930/12/23  Status: Outpatient    DIAGNOSIS: Brianna Valdez is a 81 year old female presenting to clinic in regards to her cancer of the female breast.  CONSENT VERIFIED: Yes   SET UP: Patient is setup supine   IMMOBILIZATION:  The following immobilization was used:Custom Moldable Pillow, breast board.   NARRATIVE: [name] was brought to the [facility].  Identity was confirmed.  All relevant records and images related to the planned course of therapy were reviewed.  Then, the patient was positioned in a stable reproducible clinical set-up for radiation therapy.  Wires were placed to delineate the clinical extent of breast tissue. A wire was placed on the scar as well.  CT images were obtained.  An isocenter was placed. Skin markings were placed.  The CT images were loaded into the planning software where the target and avoidance structures were contoured.  The radiation prescription was entered and confirmed. The patient was discharged in stable condition and tolerated simulation well.    TREATMENT PLANNING NOTE:  Treatment planning then occurred. I have requested : MLC's, isodose plan, basic dose calculation  I personally designed and supervised the construction of 5 medically necessary complex treatment devices for the protection of critical normal structures including the lungs and contralateral breast as well as the immobilization device which is necessary for set up certainty.   TREATMENT PLANNING NOTE/3D Simulation Note Treatment planning then occurred. I have requested : MLC's, isodose plan, basic dose calculation  3D simulation was performed.  I personally constructed 6 complex treatment devices in the form of MLCs which will be used for beam modification and to protect critical structures including the heart and lung.  I have requested a dose volume histogram of the heart lung  and tumor cavity.   This document serves as a record of services personally performed by Thea Silversmith , MD. It was created on her behalf by Lenn Cal, a trained medical scribe. The creation of this record is based on the scribe's personal observations and the provider's statements to them. This document has been checked and approved by the attending provider.  ------------------------------------------------  Thea Silversmith, MD

## 2015-05-14 DIAGNOSIS — Z510 Encounter for antineoplastic radiation therapy: Secondary | ICD-10-CM | POA: Diagnosis not present

## 2015-05-15 ENCOUNTER — Ambulatory Visit
Admission: RE | Admit: 2015-05-15 | Discharge: 2015-05-15 | Disposition: A | Discharge status: Home / Self Care | Payer: Medicare HMO | Source: Ambulatory Visit | Attending: Radiation Oncology | Admitting: Radiation Oncology

## 2015-05-15 DIAGNOSIS — Z510 Encounter for antineoplastic radiation therapy: Secondary | ICD-10-CM | POA: Diagnosis not present

## 2015-05-19 ENCOUNTER — Ambulatory Visit
Admission: RE | Admit: 2015-05-19 | Discharge: 2015-05-19 | Disposition: A | Discharge status: Home / Self Care | Payer: Medicare HMO | Source: Ambulatory Visit | Attending: Radiation Oncology | Admitting: Radiation Oncology

## 2015-05-19 DIAGNOSIS — Z510 Encounter for antineoplastic radiation therapy: Secondary | ICD-10-CM | POA: Diagnosis not present

## 2015-05-20 ENCOUNTER — Ambulatory Visit
Admission: RE | Admit: 2015-05-20 | Discharge: 2015-05-20 | Disposition: A | Discharge status: Home / Self Care | Payer: Medicare HMO | Source: Ambulatory Visit | Attending: Radiation Oncology | Admitting: Radiation Oncology

## 2015-05-20 DIAGNOSIS — C50211 Malignant neoplasm of upper-inner quadrant of right female breast: Secondary | ICD-10-CM

## 2015-05-20 DIAGNOSIS — Z510 Encounter for antineoplastic radiation therapy: Secondary | ICD-10-CM | POA: Diagnosis not present

## 2015-05-20 MED ORDER — ALRA NON-METALLIC DEODORANT (RAD-ONC): 1.0000 "application " | Freq: Once | TOPICAL | Status: DC

## 2015-05-20 MED ORDER — RADIAPLEXRX EX GEL: Freq: Once | CUTANEOUS | Status: DC

## 2015-05-20 NOTE — Progress Notes (Addendum)
Pt here for patient teaching.  Pt given Radiation and You booklet, skin care instructions, Alra deodorant and Radiaplex gel. Pt reports they have watched the Radiation Therapy Education video on May 20, 2015.  Reviewed areas of pertinence such as  . Pt able to give teach back of to pat skin, use unscented/gentle soap and drink plenty of water,apply Radiaplex bid, avoid applying anything to skin within 4 hours of treatment, avoid wearing an under wire bra and to use an electric razor if they must shave.     Pt states they have not watched the Radiation Therapy Education.  On May 20, 2015 video presented and watched. Http://rtanswers.org/treatmentinformation/whattoexpect/index Completed 2 of 33 fractions to right breast.Shown where to apply radiaplex.Routine of clinic reviewed.

## 2015-05-20 NOTE — Progress Notes (Signed)
Weekly Management Note Current Dose: 3.6  Gy  Projected Dose: 61 Gy   Narrative:  The patient presents for routine under treatment assessment.  CBCT/MVCT images/Port film x-rays were reviewed.  The chart was checked. No complaints.  Physical Findings: Weight:  . Unchanged skin. Alert and oriented.  Impression:  The patient is tolerating radiation.  Plan:  Continue treatment as planned. RN education performed.

## 2015-05-21 ENCOUNTER — Other Ambulatory Visit: Payer: Self-pay | Admitting: Radiation Oncology

## 2015-05-21 ENCOUNTER — Ambulatory Visit
Admission: RE | Admit: 2015-05-21 | Discharge: 2015-05-21 | Disposition: A | Discharge status: Home / Self Care | Payer: Medicare HMO | Source: Ambulatory Visit | Attending: Radiation Oncology | Admitting: Radiation Oncology

## 2015-05-21 DIAGNOSIS — C50211 Malignant neoplasm of upper-inner quadrant of right female breast: Secondary | ICD-10-CM

## 2015-05-21 DIAGNOSIS — Z510 Encounter for antineoplastic radiation therapy: Secondary | ICD-10-CM | POA: Diagnosis not present

## 2015-05-22 ENCOUNTER — Ambulatory Visit
Admission: RE | Admit: 2015-05-22 | Discharge: 2015-05-22 | Disposition: A | Discharge status: Home / Self Care | Payer: Medicare HMO | Source: Ambulatory Visit | Attending: Radiation Oncology | Admitting: Radiation Oncology

## 2015-05-22 DIAGNOSIS — Z510 Encounter for antineoplastic radiation therapy: Secondary | ICD-10-CM | POA: Diagnosis not present

## 2015-05-23 ENCOUNTER — Ambulatory Visit
Admission: RE | Admit: 2015-05-23 | Discharge: 2015-05-23 | Disposition: A | Discharge status: Home / Self Care | Payer: Medicare HMO | Source: Ambulatory Visit | Attending: Radiation Oncology | Admitting: Radiation Oncology

## 2015-05-23 DIAGNOSIS — Z510 Encounter for antineoplastic radiation therapy: Secondary | ICD-10-CM | POA: Diagnosis not present

## 2015-05-26 ENCOUNTER — Ambulatory Visit: Payer: Medicare HMO | Admitting: Physical Therapy

## 2015-05-26 ENCOUNTER — Ambulatory Visit: Payer: Medicare HMO | Attending: Radiation Oncology | Admitting: Physical Therapy

## 2015-05-26 ENCOUNTER — Ambulatory Visit
Admission: RE | Admit: 2015-05-26 | Discharge: 2015-05-26 | Disposition: A | Discharge status: Home / Self Care | Payer: Medicare HMO | Source: Ambulatory Visit | Attending: Radiation Oncology | Admitting: Radiation Oncology

## 2015-05-26 DIAGNOSIS — I890 Lymphedema, not elsewhere classified: Secondary | ICD-10-CM | POA: Diagnosis present

## 2015-05-26 DIAGNOSIS — M79621 Pain in right upper arm: Secondary | ICD-10-CM | POA: Diagnosis present

## 2015-05-26 DIAGNOSIS — M25521 Pain in right elbow: Secondary | ICD-10-CM

## 2015-05-26 DIAGNOSIS — Z510 Encounter for antineoplastic radiation therapy: Secondary | ICD-10-CM | POA: Diagnosis not present

## 2015-05-26 DIAGNOSIS — M25611 Stiffness of right shoulder, not elsewhere classified: Secondary | ICD-10-CM | POA: Diagnosis not present

## 2015-05-26 NOTE — Therapy (Signed)
Brianna Valdez, [place], [address] Phone: [phone]   Fax:  [phone]  Physical Therapy Evaluation  Patient Details  Name: Brianna Valdez MRN: [id number] Date of Birth: 1931/08/07 Referring Provider:  Thea Silversmith, MD  Encounter Date: 05/26/2015      PT End of Session - 05/26/15 1700    Visit Number 1   Number of Visits 9   Date for PT Re-Evaluation 06/27/15   PT Start Time 1606   PT Stop Time 1645   PT Time Calculation (min) 39 min   Activity Tolerance Patient tolerated treatment well   Behavior During Therapy [facility] for tasks assessed/performed      Past Medical History  Diagnosis Date  . Hypertension   . Breast cancer of upper-inner quadrant of left female breast 11/06/2014  . Anemia   . Thyroid disease   . GERD (gastroesophageal reflux disease)   . Diabetic neuropathy     slight in her feet  . Arthritis   . Glaucoma   . Diabetes mellitus     type 2     Past Surgical History  Procedure Laterality Date  . Abdominal hysterectomy    . Thyroidectomy    . Eye surgery Bilateral     cataract surgery with lens implant  . Colonoscopy    . Portacath placement Left 11/26/2014    Procedure: INSERTION PORT-A-CATH;  Surgeon: Excell Seltzer, MD;  Location: [place];  Service: General;  Laterality: Left;  . Portacath placement Left 11/26/2014    Procedure: REPOSITION OF A PORT-A-CATH;  Surgeon: Excell Seltzer, MD;  Location: [place];  Service: General;  Laterality: Left;  . Breast lumpectomy with sentinel lymph node biopsy Right 04/08/2015  . Breast lumpectomy with sentinel lymph node biopsy Right 04/08/2015    Procedure: RIGHT BREAST LUMPECTOMY WITH RIGHT AXILLARY SENTINEL LYMPH NODE BIOPSY;  Surgeon: Excell Seltzer, MD;  Location: [place];  Service: General;  Laterality: Right;  . Port-a-cath removal Left 04/08/2015    Procedure: REMOVAL PORT-A-CATH LEFT CHEST;  Surgeon: Excell Seltzer, MD;   Location: [place];  Service: General;  Laterality: Left;    There were no vitals filed for this visit.  Visit Diagnosis:  Stiffness of joint, shoulder region, right - Plan: PT plan of care cert/re-cert  Pain in joint, upper arm, right - Plan: PT plan of care cert/re-cert  Lymphedema of breast - Plan: PT plan of care cert/re-cert      Subjective Assessment - 05/26/15 1610    Subjective breast feels swollen still; arm doesn't, but sometimes gets sharp sensation at posterior upper arm   Pertinent History Brianna Valdez is a 81 year old history of right breast cancer triple negative disease who underwent neoadjuvant chemotherapy with weekly Taxol and carboplatin. She underwent lumpectomy on 04/08/2015 and is here today to discuss the pathology report. She had full axillary lymph node dissection which revealed one positive lymph node.  Now in second week of radiation therapy.  HTN and diabetes controlled with meds.   Patient Stated Goals have arm assessed and make sure no problems   Currently in Pain? Yes   Pain Score 7    Pain Location Arm   Pain Orientation Posterior;Upper   Pain Descriptors / Indicators Sore  "and things running around in there"   Aggravating Factors  bending elbow fully or putting arm down on arm rest   Pain Relieving Factors change position/move arm; muscle rub  [facility] PT Assessment - 05/26/15 0001    Assessment   Medical Diagnosis Right breast cancer   Precautions   Precautions Other (comment)   Precaution Comments cancer precautions; currently undergoing radiatin therapy   Restrictions   Weight Bearing Restrictions No   Balance Screen   Has the patient fallen in the past 6 months Yes   How many times? twice  fell cleaning closet due to too much clutter   Has the patient had a decrease in activity level because of a fear of falling?  No   Is the patient reluctant to leave their home because of a fear of falling?  No   Home Environment    Available Help at Discharge --  granddaughter is in PT school at [employer] Retired  from hospital administration   Cognition   Overall Cognitive Status Within Functional Limits for tasks assessed   Observation/Other Assessments   Observations right breast appears swollen and feels firm to palpation; firm area at right ALND incision; lumpectomy incision is at right medial breast   Quick DASH  22.73   AROM   Right Shoulder Flexion 129 Degrees   Right Shoulder ABduction 120 Degrees   Right Shoulder External Rotation 75 Degrees  burning sensation after moving right arm for evals   Left Shoulder Flexion 139 Degrees   Left Shoulder ABduction 150 Degrees   Left Shoulder External Rotation 80 Degrees   Palpation   Palpation comment palpable cording at right axilla (mild to moderate) with abduction of right arm           LYMPHEDEMA/ONCOLOGY QUESTIONNAIRE - 05/26/15 1622    Type   Cancer Type right breast   Surgeries   Lumpectomy Date 04/08/15   Treatment   Past Chemotherapy Treatment Yes   Date --  neo-adjuvant   Active Radiation Treatment Yes   Date 05/19/15  approx. start date   Body Site right breast   What other symptoms do you have   Are you having pitting edema No   Right Upper Extremity Lymphedema   10 cm Proximal to Olecranon Process 28.5 cm   Olecranon Process 23.6 cm   10 cm Proximal to Ulnar Styloid Process 21.3 cm   Just Proximal to Ulnar Styloid Process 15.7 cm   Across Hand at [employer] 19.4 cm   At [place] of 2nd Digit 6.4 cm   Left Upper Extremity Lymphedema   10 cm Proximal to Olecranon Process 29.8 cm   Olecranon Process 24.2 cm   10 cm Proximal to Ulnar Styloid Process 21.2 cm   Just Proximal to Ulnar Styloid Process 15.4 cm   Across Hand at [employer] 18.9 cm   At [place] of 2nd Digit 6.1 cm           Quick Dash - 05/26/15 0001    Open a tight or new jar No difficulty   Do heavy  household chores (wash walls, wash floors) Mild difficulty   Carry a shopping bag or briefcase Mild difficulty   Wash your back Mild difficulty   Use a knife to cut food No difficulty   Recreational activities in which you take some force or impact through your arm, shoulder, or hand (golf, hammering, tennis) Mild difficulty   During the past week, to what extent has your arm, shoulder or hand problem interfered with your normal social activities with family, friends, neighbors, or groups?  Not at all   During the past week, to what extent has your arm, shoulder or hand problem limited your work or other regular daily activities Slightly   Arm, shoulder, or hand pain. Moderate   Tingling (pins and needles) in your arm, shoulder, or hand Severe   Difficulty Sleeping No difficulty   DASH Score 22.73 %                           Breast Clinic Goals - 11/13/14 1148    Patient will be able to verbalize understanding of pertinent lymphedema risk reduction practices relevant to her diagnosis specifically related to skin care.   Time 1   Period Days   Status Achieved   Patient will be able to return demonstrate and/or verbalize understanding of the post-op home exercise program related to regaining shoulder range of motion.   Time 1   Period Days   Status Achieved   Patient will be able to verbalize understanding of the importance of attending the postoperative After Breast Cancer Class for further lymphedema risk reduction education and therapeutic exercise.   Time 1   Period Days   Status Achieved          Long Term Clinic Goals - 05/26/15 1709    CC Long Term Goal  #1   Title Patient will be independent in self-manual lymph drainage for right breast   Time 4   Period Weeks   Status New   CC Long Term Goal  #2   Title knowledgeable about where and how to obtain compression sleeve and bra   Time 1   Period Weeks   Status New   CC Long Term Goal  #3   Title report  decrease in right upper arm discomfort by at least 50%   Time 4   Period Weeks   Status New   CC Long Term Goal  #4   Title right shoulder active flexion to at least 140 degrees for improved overhead reach   Time 4   Period Weeks   Status New   CC Long Term Goal  #5   Title right shoulder active abduction to at least 160 degrees for improved ADLs   Time 4   Period Weeks   Status New            Plan - 05/26/15 1701    Clinical Impression Statement A very polite and pleasant 81 year-old woman s/p neo-adjuvant chemotherapy and lumpectomy with ALND for right breast cancer, currently undergoing radiation treatment.  She would like help to obtain a prophylactic right compression sleeve, having learned about this from [facility] class, but she also has right breast swelling that could benefit from therapy.  She has palpable cording in right axilla and active right shoulder flexion and abduction are decreased from pre-op measurements.                                                 Pt will benefit from skilled therapeutic intervention in order to improve on the following deficits Decreased range of motion;Increased edema;Decreased knowledge of precautions;Decreased strength;Impaired UE functional use   Rehab Potential Excellent   Clinical Impairments Affecting Rehab Potential currently undergoing radiation treatment so therapy may have to be modified if patient has skin irritation or discomfort from  this   PT Frequency 2x / week   PT Duration 4 weeks   PT Treatment/Interventions Manual techniques;Passive range of motion;Manual lymph drainage;Patient/family education;DME Instruction;ADLs/Self Care Home Management;Therapeutic exercise   PT Next Visit Plan begin P/AA/AROM right shoulder with work on axillary cording; manual lymph drainage for right breast and instruct patient in self-massage; as tolerated, myofascial work/soft tissue mobilization for right axillary node dissection scar   Recommended  Other Services faxed info to [facility] for patient to be measured for a class I off the shelf prophylactic sleeve and possible for compression bras, if desired   Consulted and Agree with Plan of Care Patient          G-Codes - 06/12/15 1712    Functional Assessment Tool Used quick DASH   Functional Limitation Carrying, moving and handling objects   Carrying, Moving and Handling Objects Current Status (F8421) At least 20 percent but less than 40 percent impaired, limited or restricted   Carrying, Moving and Handling Objects Goal Status (I3128) At least 1 percent but less than 20 percent impaired, limited or restricted       Problem List Patient Active Problem List   Diagnosis Date Noted  . Breast cancer 04/08/2015  . Breast cancer of upper-inner quadrant of right female breast 11/06/2014    Shamon Cothran 2015-06-12, 5:14 PM  [place], [place], [address] Phone: [phone]   Fax:  [place], PT 12-Jun-2015 5:15 PM

## 2015-05-27 ENCOUNTER — Encounter: Payer: Self-pay | Admitting: Radiation Oncology

## 2015-05-27 ENCOUNTER — Ambulatory Visit
Admission: RE | Admit: 2015-05-27 | Discharge: 2015-05-27 | Disposition: A | Discharge status: Home / Self Care | Payer: Medicare HMO | Source: Ambulatory Visit | Attending: Radiation Oncology | Admitting: Radiation Oncology

## 2015-05-27 VITALS — BP 135/58 | HR 78 | Temp 98.3°F | Resp 20 | Wt 155.5 lb

## 2015-05-27 DIAGNOSIS — Z510 Encounter for antineoplastic radiation therapy: Secondary | ICD-10-CM | POA: Diagnosis not present

## 2015-05-27 DIAGNOSIS — C50211 Malignant neoplasm of upper-inner quadrant of right female breast: Secondary | ICD-10-CM

## 2015-05-27 NOTE — Progress Notes (Addendum)
Weekly  radtx right breast, 6/33 completed, slight tanning, skin intact,  using radaiplex bid  , feels sensation  In right arm above the elbow at times since surgery, appetite good, staying hydrated with water stated, energy  Level not too good but was that before starting treatment stated, went to get measured for lymphadema sleeve yesterday 3:49 PM BP 135/58 mmHg  Pulse 78  Temp(Src) 98.3 F (36.8 C) (Oral)  Resp 20  Wt 155 lb 8 oz (70.534 kg)  Wt Readings from Last 3 Encounters:  05/27/15 155 lb 8 oz (70.534 kg)  04/23/15 154 lb 3.2 oz (69.945 kg)  04/16/15 156 lb 3.2 oz (70.852 kg)

## 2015-05-27 NOTE — Progress Notes (Signed)
Weekly Management Note Current Dose: 10.8 Gy  Projected Dose: 61 Gy   Narrative:  The patient presents for routine under treatment assessment.  CBCT/MVCT images/Port film x-rays were reviewed.  The chart was checked. C/o right axilla pain. Seeing PT and being fit for a sleeve.   Physical Findings: Weight: 155 lb 8 oz (70.534 kg). Skin slightly darker. . Alert and oriented.  Impression:  The patient is tolerating radiation.  Plan:  Continue treatment as planned. Continue PT>

## 2015-05-28 ENCOUNTER — Ambulatory Visit
Admission: RE | Admit: 2015-05-28 | Discharge: 2015-05-28 | Disposition: A | Discharge status: Home / Self Care | Payer: Medicare HMO | Source: Ambulatory Visit | Attending: Radiation Oncology | Admitting: Radiation Oncology

## 2015-05-28 DIAGNOSIS — Z510 Encounter for antineoplastic radiation therapy: Secondary | ICD-10-CM | POA: Diagnosis not present

## 2015-05-29 ENCOUNTER — Ambulatory Visit
Admission: RE | Admit: 2015-05-29 | Discharge: 2015-05-29 | Disposition: A | Discharge status: Home / Self Care | Payer: Medicare HMO | Source: Ambulatory Visit | Attending: Radiation Oncology | Admitting: Radiation Oncology

## 2015-05-29 DIAGNOSIS — Z510 Encounter for antineoplastic radiation therapy: Secondary | ICD-10-CM | POA: Diagnosis not present

## 2015-05-30 ENCOUNTER — Ambulatory Visit
Admission: RE | Admit: 2015-05-30 | Discharge: 2015-05-30 | Disposition: A | Discharge status: Home / Self Care | Payer: Medicare HMO | Source: Ambulatory Visit | Attending: Radiation Oncology | Admitting: Radiation Oncology

## 2015-05-30 DIAGNOSIS — Z510 Encounter for antineoplastic radiation therapy: Secondary | ICD-10-CM | POA: Diagnosis not present

## 2015-06-02 ENCOUNTER — Ambulatory Visit
Admission: RE | Admit: 2015-06-02 | Discharge: 2015-06-02 | Disposition: A | Discharge status: Home / Self Care | Payer: Medicare HMO | Source: Ambulatory Visit | Attending: Radiation Oncology | Admitting: Radiation Oncology

## 2015-06-02 ENCOUNTER — Ambulatory Visit: Payer: Medicare HMO | Admitting: Physical Therapy

## 2015-06-02 DIAGNOSIS — M25521 Pain in right elbow: Secondary | ICD-10-CM

## 2015-06-02 DIAGNOSIS — I890 Lymphedema, not elsewhere classified: Secondary | ICD-10-CM

## 2015-06-02 DIAGNOSIS — Z510 Encounter for antineoplastic radiation therapy: Secondary | ICD-10-CM | POA: Diagnosis not present

## 2015-06-02 DIAGNOSIS — M25611 Stiffness of right shoulder, not elsewhere classified: Secondary | ICD-10-CM | POA: Diagnosis not present

## 2015-06-02 NOTE — Therapy (Signed)
[place], [place], [address] Phone: [phone]   Fax:  [phone]  Physical Therapy Treatment  Patient Details  Name: Brianna Valdez MRN: [id number] Date of Birth: 1930/10/27 Referring Provider:  Thea Silversmith, MD  Encounter Date: 06/02/2015      PT End of Session - 06/02/15 1156    Visit Number 2   Number of Visits 9   Date for PT Re-Evaluation 06/27/15   PT Start Time 1106   PT Stop Time 1150   PT Time Calculation (min) 44 min   Activity Tolerance Patient tolerated treatment well   Behavior During Therapy [facility] for tasks assessed/performed      Past Medical History  Diagnosis Date  . Hypertension   . Breast cancer of upper-inner quadrant of left female breast 11/06/2014  . Anemia   . Thyroid disease   . GERD (gastroesophageal reflux disease)   . Diabetic neuropathy     slight in her feet  . Arthritis   . Glaucoma   . Diabetes mellitus     type 2     Past Surgical History  Procedure Laterality Date  . Abdominal hysterectomy    . Thyroidectomy    . Eye surgery Bilateral     cataract surgery with lens implant  . Colonoscopy    . Portacath placement Left 11/26/2014    Procedure: INSERTION PORT-A-CATH;  Surgeon: Excell Seltzer, MD;  Location: [place];  Service: General;  Laterality: Left;  . Portacath placement Left 11/26/2014    Procedure: REPOSITION OF A PORT-A-CATH;  Surgeon: Excell Seltzer, MD;  Location: [place];  Service: General;  Laterality: Left;  . Breast lumpectomy with sentinel lymph node biopsy Right 04/08/2015  . Breast lumpectomy with sentinel lymph node biopsy Right 04/08/2015    Procedure: RIGHT BREAST LUMPECTOMY WITH RIGHT AXILLARY SENTINEL LYMPH NODE BIOPSY;  Surgeon: Excell Seltzer, MD;  Location: [place];  Service: General;  Laterality: Right;  . Port-a-cath removal Left 04/08/2015    Procedure: REMOVAL PORT-A-CATH LEFT CHEST;  Surgeon: Excell Seltzer, MD;   Location: [place];  Service: General;  Laterality: Left;    There were no vitals filed for this visit.  Visit Diagnosis:  Stiffness of joint, shoulder region, right  Pain in joint, upper arm, right  Lymphedema of breast      Subjective Assessment - 06/02/15 1109    Subjective Nothing new.  Radiation is going okay.   Pertinent History Brianna Valdez is a 81 year old history of right breast cancer triple negative disease who underwent neoadjuvant chemotherapy with weekly Taxol and carboplatin. She underwent lumpectomy on 04/08/2015.  She had full axillary lymph node dissection which revealed one positive lymph node.  Now in second week of radiation therapy.  HTN and diabetes controlled with meds.   Currently in Pain? Yes   Pain Score 0-No pain   Pain Location Arm   Pain Orientation Right;Upper;Posterior   Pain Descriptors / Indicators --  not really a pain--a sensation                         [facility] Adult PT Treatment/Exercise - 06/02/15 0001    Manual Therapy   Manual Therapy Soft tissue mobilization;Myofascial release;Manual Lymphatic Drainage (MLD);Passive ROM   Soft tissue mobilization At right axilla to stretch/relase cording.   Manual Lymphatic Drainage (MLD) In supine, short neck, left axilla and right groin, anterior interaxillary anastomosis and right axillo-inguinal anastomosis, and right breast, directing toward  pathways; in left sidelying, right lateral breast and posterior interaxillary anastomosis right to left, and right axillo-inguinal anastomosis.   Passive ROM Right shoulder PROM with stretch to tolerance into IR, ER, abduction, and flexion;                         [facility] Goals - 05/26/15 1709    CC Long Term Goal  #1   Title Patient will be independent in self-manual lymph drainage for right breast   Time 4   Period Weeks   Status New   CC Long Term Goal  #2   Title knowledgeable about where and how to obtain  compression sleeve and bra   Time 1   Period Weeks   Status New   CC Long Term Goal  #3   Title report decrease in right upper arm discomfort by at least 50%   Time 4   Period Weeks   Status New   CC Long Term Goal  #4   Title right shoulder active flexion to at least 140 degrees for improved overhead reach   Time 4   Period Weeks   Status New   CC Long Term Goal  #5   Title right shoulder active abduction to at least 160 degrees for improved ADLs   Time 4   Period Weeks   Status New            Plan - 06/02/15 1157    Clinical Impression Statement Patient did well tolerating stretch, soft tissue mobilization, and manual lymph drainage today.   Pt will benefit from skilled therapeutic intervention in order to improve on the following deficits Decreased range of motion;Increased edema;Decreased knowledge of precautions;Decreased strength;Impaired UE functional use   Rehab Potential Excellent   Clinical Impairments Affecting Rehab Potential currently undergoing radiation treatment so therapy may have to be modified if patient has skin irritation or discomfort from this   PT Frequency 2x / week   PT Duration 4 weeks   PT Treatment/Interventions Passive range of motion;Manual techniques;Manual lymph drainage   PT Next Visit Plan Continue P/AA/AROM right shoulder with work on axillary cording; manual lymph drainage for right breast and instruct patient in self-massage; as tolerated, myofascial work/soft tissue mobilization for right axillary node dissection scar   Recommended Other Services Patient waiting to hear from [facility].   Consulted and Agree with Plan of Care Patient        Problem List Patient Active Problem List   Diagnosis Date Noted  . Breast cancer 04/08/2015  . Breast cancer of upper-inner quadrant of right female breast 11/06/2014    Brianna Valdez 06/02/2015, 12:00 PM  [place], [place], [address] Phone: [phone]   Fax:  [place], PT 06/02/2015 12:00 PM

## 2015-06-03 ENCOUNTER — Ambulatory Visit
Admission: RE | Admit: 2015-06-03 | Discharge: 2015-06-03 | Disposition: A | Discharge status: Home / Self Care | Payer: Medicare HMO | Source: Ambulatory Visit | Attending: Radiation Oncology | Admitting: Radiation Oncology

## 2015-06-03 VITALS — BP 118/51 | HR 74 | Temp 98.1°F | Wt 153.3 lb

## 2015-06-03 DIAGNOSIS — Z510 Encounter for antineoplastic radiation therapy: Secondary | ICD-10-CM | POA: Diagnosis not present

## 2015-06-03 DIAGNOSIS — C50212 Malignant neoplasm of upper-inner quadrant of left female breast: Secondary | ICD-10-CM

## 2015-06-03 NOTE — Progress Notes (Signed)
Weekly Management Note Current Dose: 21.6 Gy  Projected Dose: 61 Gy   Narrative:  The patient presents for routine under treatment assessment.  CBCT/MVCT images/Port film x-rays were reviewed.  The chart was checked. Pain, breast swelling and arm swelling better after PT yesterday.   Physical Findings: Weight: 153 lb 4.8 oz (69.536 kg). Skin slightly darker.  Alert and oriented.  Impression:  The patient is tolerating radiation.  Plan:  Continue treatment as planned. Continue PT. Continue radiaplex.

## 2015-06-04 ENCOUNTER — Ambulatory Visit: Payer: Medicare HMO | Admitting: Physical Therapy

## 2015-06-04 ENCOUNTER — Ambulatory Visit
Admission: RE | Admit: 2015-06-04 | Discharge: 2015-06-04 | Disposition: A | Discharge status: Home / Self Care | Payer: Medicare HMO | Source: Ambulatory Visit | Attending: Radiation Oncology | Admitting: Radiation Oncology

## 2015-06-04 DIAGNOSIS — Z510 Encounter for antineoplastic radiation therapy: Secondary | ICD-10-CM | POA: Diagnosis not present

## 2015-06-04 DIAGNOSIS — I890 Lymphedema, not elsewhere classified: Secondary | ICD-10-CM

## 2015-06-04 DIAGNOSIS — M25611 Stiffness of right shoulder, not elsewhere classified: Secondary | ICD-10-CM

## 2015-06-04 DIAGNOSIS — M25521 Pain in right elbow: Secondary | ICD-10-CM

## 2015-06-04 NOTE — Patient Instructions (Signed)

## 2015-06-04 NOTE — Therapy (Signed)
[place], [place], [address] Phone: [phone]   Fax:  [phone]  Physical Therapy Treatment  Patient Details  Name: Brianna Valdez MRN: [id number] Date of Birth: May 18, 1931 Referring Provider:  Thea Silversmith, MD  Encounter Date: 06/04/2015      PT End of Session - 06/04/15 1202    Visit Number 3   Number of Visits 9   Date for PT Re-Evaluation 06/27/15   PT Start Time 1105   PT Stop Time 1153   PT Time Calculation (min) 48 min   Activity Tolerance Patient tolerated treatment well   Behavior During Therapy [facility] for tasks assessed/performed      Past Medical History  Diagnosis Date  . Hypertension   . Breast cancer of upper-inner quadrant of left female breast 11/06/2014  . Anemia   . Thyroid disease   . GERD (gastroesophageal reflux disease)   . Diabetic neuropathy     slight in her feet  . Arthritis   . Glaucoma   . Diabetes mellitus     type 2     Past Surgical History  Procedure Laterality Date  . Abdominal hysterectomy    . Thyroidectomy    . Eye surgery Bilateral     cataract surgery with lens implant  . Colonoscopy    . Portacath placement Left 11/26/2014    Procedure: INSERTION PORT-A-CATH;  Surgeon: Excell Seltzer, MD;  Location: [place];  Service: General;  Laterality: Left;  . Portacath placement Left 11/26/2014    Procedure: REPOSITION OF A PORT-A-CATH;  Surgeon: Excell Seltzer, MD;  Location: [place];  Service: General;  Laterality: Left;  . Breast lumpectomy with sentinel lymph node biopsy Right 04/08/2015  . Breast lumpectomy with sentinel lymph node biopsy Right 04/08/2015    Procedure: RIGHT BREAST LUMPECTOMY WITH RIGHT AXILLARY SENTINEL LYMPH NODE BIOPSY;  Surgeon: Excell Seltzer, MD;  Location: [place];  Service: General;  Laterality: Right;  . Port-a-cath removal Left 04/08/2015    Procedure: REMOVAL PORT-A-CATH LEFT CHEST;  Surgeon: Excell Seltzer, MD;   Location: [place];  Service: General;  Laterality: Left;    There were no vitals filed for this visit.  Visit Diagnosis:  Stiffness of joint, shoulder region, right  Pain in joint, upper arm, right  Lymphedema of breast      Subjective Assessment - 06/04/15 1108    Subjective "I did notice some difference in the breast--less tightness--after last session."   Currently in Pain? No/denies                         [facility] Adult PT Treatment/Exercise - 06/04/15 0001    Manual Therapy   Manual Therapy Myofascial release   Soft tissue mobilization At right axilla to stretch/relase cording.   Myofascial Release across right axilla   Manual Lymphatic Drainage (MLD) Instructed patient as follows:  deep breathing, short neck, left axilla and anterior interaxillary pathway, right groin and axillo-inguinal pathway, and right breast, directing toward pathways.     Passive ROM Right shoulder PROM with stretch to tolerance into IR, ER, abduction, and flexion;                 PT Education - 06/04/15 1201    Education provided Yes   Education Details self-manual lymph drainage for right breast   Person(s) Educated Patient   Methods Explanation;Tactile cues;Handout;Verbal cues   Comprehension Verbalized understanding;Returned demonstration;Need further instruction  [facility] Goals - 06/04/15 1205    CC Long Term Goal  #1   Title Patient will be independent in self-manual lymph drainage for right breast   Status Partially Met   CC Long Term Goal  #2   Title knowledgeable about where and how to obtain compression sleeve and bra   Status On-going   CC Long Term Goal  #3   Title report decrease in right upper arm discomfort by at least 50%   Status On-going   CC Long Term Goal  #4   Title right shoulder active flexion to at least 140 degrees for improved overhead reach   Status On-going   CC Long Term Goal  #5   Title right shoulder active  abduction to at least 160 degrees for improved ADLs   Status On-going            Plan - 06/04/15 1202    Clinical Impression Statement Again did well tolerating stretch for right shoulder area.  Instructed in self-manual lymph drainage; will benefit from review.   Pt will benefit from skilled therapeutic intervention in order to improve on the following deficits Decreased range of motion;Increased edema;Decreased knowledge of precautions;Decreased strength;Impaired UE functional use   Rehab Potential Excellent   Clinical Impairments Affecting Rehab Potential currently undergoing radiation treatment so therapy may have to be modified if patient has skin irritation or discomfort from this   PT Frequency 2x / week   PT Duration 4 weeks   PT Treatment/Interventions Passive range of motion;Manual techniques;Manual lymph drainage   PT Next Visit Plan reassess; review self-manual lymph drainage; continue manual stretches/and or therapeutic exercise   Recommended Other Services Phone call to [facility] indicated patient will be receiving a call from them; can't go elsewhere locally due to her insurance.   Consulted and Agree with Plan of Care Patient        Problem List Patient Active Problem List   Diagnosis Date Noted  . Breast cancer 04/08/2015  . Breast cancer of upper-inner quadrant of right female breast 11/06/2014    Brianna Valdez 06/04/2015, 12:06 PM  [facility], [place], [address] Phone: [phone]   Fax:  [place], PT 06/04/2015 12:06 PM

## 2015-06-05 ENCOUNTER — Ambulatory Visit
Admission: RE | Admit: 2015-06-05 | Discharge: 2015-06-05 | Disposition: A | Discharge status: Home / Self Care | Payer: Medicare HMO | Source: Ambulatory Visit | Attending: Radiation Oncology | Admitting: Radiation Oncology

## 2015-06-05 DIAGNOSIS — Z510 Encounter for antineoplastic radiation therapy: Secondary | ICD-10-CM | POA: Diagnosis not present

## 2015-06-06 ENCOUNTER — Ambulatory Visit
Admission: RE | Admit: 2015-06-06 | Discharge: 2015-06-06 | Disposition: A | Discharge status: Home / Self Care | Payer: Medicare HMO | Source: Ambulatory Visit | Attending: Radiation Oncology | Admitting: Radiation Oncology

## 2015-06-06 DIAGNOSIS — Z510 Encounter for antineoplastic radiation therapy: Secondary | ICD-10-CM | POA: Diagnosis not present

## 2015-06-10 ENCOUNTER — Ambulatory Visit
Admission: RE | Admit: 2015-06-10 | Discharge: 2015-06-10 | Disposition: A | Discharge status: Home / Self Care | Payer: Medicare HMO | Source: Ambulatory Visit | Attending: Radiation Oncology | Admitting: Radiation Oncology

## 2015-06-10 ENCOUNTER — Ambulatory Visit: Payer: Medicare HMO | Attending: Radiation Oncology | Admitting: Physical Therapy

## 2015-06-10 VITALS — BP 113/48 | HR 82 | Temp 98.3°F | Wt 152.8 lb

## 2015-06-10 DIAGNOSIS — I890 Lymphedema, not elsewhere classified: Secondary | ICD-10-CM | POA: Diagnosis present

## 2015-06-10 DIAGNOSIS — M79621 Pain in right upper arm: Secondary | ICD-10-CM | POA: Insufficient documentation

## 2015-06-10 DIAGNOSIS — Z510 Encounter for antineoplastic radiation therapy: Secondary | ICD-10-CM | POA: Diagnosis not present

## 2015-06-10 DIAGNOSIS — M25611 Stiffness of right shoulder, not elsewhere classified: Secondary | ICD-10-CM | POA: Insufficient documentation

## 2015-06-10 DIAGNOSIS — C50211 Malignant neoplasm of upper-inner quadrant of right female breast: Secondary | ICD-10-CM

## 2015-06-10 NOTE — Progress Notes (Signed)
Weekly Management Note Current Dose: 21 Gy  Projected Dose: 61 Gy   Narrative:  The patient presents for routine under treatment assessment.  CBCT/MVCT images/Port film x-rays were reviewed.  The chart was checked. Appts continue with PT. Some skin darkening but no pain.   Physical Findings: Weight: 152 lb 12.8 oz (69.31 kg). Skin slightly darker.  Alert and oriented.  Impression:  The patient is tolerating radiation.  Plan:  Continue treatment as planned. Continue PT. Continue radiaplex.

## 2015-06-10 NOTE — Progress Notes (Signed)
Weekly assessment of radiation to right breast.Completed 16 of 33 treatments.Denies pain.Mild darkening of skin.continue applying radiaplex twice daily.

## 2015-06-11 ENCOUNTER — Ambulatory Visit
Admission: RE | Admit: 2015-06-11 | Discharge: 2015-06-11 | Disposition: A | Discharge status: Home / Self Care | Payer: Medicare HMO | Source: Ambulatory Visit | Attending: Radiation Oncology | Admitting: Radiation Oncology

## 2015-06-11 DIAGNOSIS — Z510 Encounter for antineoplastic radiation therapy: Secondary | ICD-10-CM | POA: Diagnosis not present

## 2015-06-11 NOTE — Therapy (Signed)
[place], [place], [address] Phone: [phone]   Fax:  [phone]  Physical Therapy Treatment  Patient Details  Name: Brianna Valdez MRN: [id number] Date of Birth: [birth year] Referring Provider:  Thea Silversmith, MD  Encounter Date: 06/10/2015      PT End of Session - 06/11/15 0734    Visit Number 4   Number of Visits 9   Date for PT Re-Evaluation 06/27/15   PT Start Time 1605   PT Stop Time 1645   PT Time Calculation (min) 40 min   Activity Tolerance Patient tolerated treatment well   Behavior During Therapy [facility] for tasks assessed/performed      Past Medical History  Diagnosis Date  . Hypertension   . Breast cancer of upper-inner quadrant of left female breast 11/06/2014  . Anemia   . Thyroid disease   . GERD (gastroesophageal reflux disease)   . Diabetic neuropathy     slight in her feet  . Arthritis   . Glaucoma   . Diabetes mellitus     type 2     Past Surgical History  Procedure Laterality Date  . Abdominal hysterectomy    . Thyroidectomy    . Eye surgery Bilateral     cataract surgery with lens implant  . Colonoscopy    . Portacath placement Left 11/26/2014    Procedure: INSERTION PORT-A-CATH;  Surgeon: Excell Seltzer, MD;  Location: [place];  Service: General;  Laterality: Left;  . Portacath placement Left 11/26/2014    Procedure: REPOSITION OF A PORT-A-CATH;  Surgeon: Excell Seltzer, MD;  Location: [place];  Service: General;  Laterality: Left;  . Breast lumpectomy with sentinel lymph node biopsy Right 04/08/2015  . Breast lumpectomy with sentinel lymph node biopsy Right 04/08/2015    Procedure: RIGHT BREAST LUMPECTOMY WITH RIGHT AXILLARY SENTINEL LYMPH NODE BIOPSY;  Surgeon: Excell Seltzer, MD;  Location: [place];  Service: General;  Laterality: Right;  . Port-a-cath removal Left 04/08/2015    Procedure: REMOVAL PORT-A-CATH LEFT CHEST;  Surgeon: Excell Seltzer, MD;   Location: [place];  Service: General;  Laterality: Left;    There were no vitals filed for this visit.  Visit Diagnosis:  Lymphedema of breast      Subjective Assessment - 06/10/15 1613    Subjective "I want to make sure I'm doing it right"  pt feels that she has been having some problems with fullnes in her breast and "sensations" down her arm She says that it is better since she has been coming for treatment   Pertinent History Brianna Valdez is a 81 year old history of right breast cancer triple negative disease who underwent neoadjuvant chemotherapy with weekly Taxol and carboplatin. She underwent lumpectomy on 04/08/2015.  She had full axillary lymph node dissection which revealed one positive lymph node.  Now in second week of radiation therapy.  HTN and diabetes controlled with meds.   Currently in Pain? No/denies                                 PT Education - 06/11/15 0733    Education provided Yes   Education Details reinforcement of protecting radiated skin, short neck, diaphragmatic breathing and abdomen below breast    Person(s) Educated Patient   Methods Explanation;Demonstration;Tactile cues;Verbal cues   Comprehension Returned demonstration;Verbalized understanding                [facility]  Term Clinic Goals - 06/11/15 0756    CC Long Term Goal  #1   Title Patient will be independent in self-manual lymph drainage for right breast   Status Achieved   CC Long Term Goal  #2   Title knowledgeable about where and how to obtain compression sleeve and bra   Status Achieved   CC Long Term Goal  #3   Title report decrease in right upper arm discomfort by at least 50%   Status On-going   CC Long Term Goal  #4   Title right shoulder active flexion to at least 140 degrees for improved overhead reach   Status On-going   CC Long Term Goal  #5   Title right shoulder active abduction to at least 160 degrees for improved ADLs   Status On-going             Plan - 06/11/15 0734    Clinical Impression Statement Pt is showing improvement and demonstrated learning of abbreviated manual lymph drainage technique. She plans to be measured for sleeve and compression bra next week and should be able to discharge after next visit  She knows she would be able to have more therapy if needed after radiation is complete.   PT Next Visit Plan remeasure arm circumference and ROM  assess performance of self care. Do Quick DASH Likely discharge.         Problem List Patient Active Problem List   Diagnosis Date Noted  . Breast cancer 04/08/2015  . Breast cancer of upper-inner quadrant of right female breast 11/06/2014   Donato Heinz. Owens Shark, PT  06/11/2015, 8:50 AM  [place], [place], [address] Phone: [phone]   Fax:  [phone]

## 2015-06-12 ENCOUNTER — Ambulatory Visit
Admission: RE | Admit: 2015-06-12 | Discharge: 2015-06-12 | Disposition: A | Discharge status: Home / Self Care | Payer: Medicare HMO | Source: Ambulatory Visit | Attending: Radiation Oncology | Admitting: Radiation Oncology

## 2015-06-12 ENCOUNTER — Ambulatory Visit: Payer: Medicare HMO | Admitting: Physical Therapy

## 2015-06-12 DIAGNOSIS — I890 Lymphedema, not elsewhere classified: Secondary | ICD-10-CM | POA: Diagnosis not present

## 2015-06-12 DIAGNOSIS — Z510 Encounter for antineoplastic radiation therapy: Secondary | ICD-10-CM | POA: Diagnosis not present

## 2015-06-12 DIAGNOSIS — M25521 Pain in right elbow: Secondary | ICD-10-CM

## 2015-06-12 DIAGNOSIS — M25611 Stiffness of right shoulder, not elsewhere classified: Secondary | ICD-10-CM

## 2015-06-12 NOTE — Therapy (Signed)
Brianna Valdez, [place], [address] Phone: [phone]   Fax:  [phone]  Physical Therapy Treatment  Patient Details  Name: Brianna Valdez MRN: [id number] Date of Birth: [birth year] Referring Provider:  Thea Silversmith, MD  Encounter Date: 06/12/2015      PT End of Session - 06/12/15 1710    Visit Number 5   Number of Visits 9   Date for PT Re-Evaluation 06/27/15   PT Start Time 1510   PT Stop Time 1548   PT Time Calculation (min) 38 min   Activity Tolerance Patient tolerated treatment well   Behavior During Therapy [facility] for tasks assessed/performed      Past Medical History  Diagnosis Date  . Hypertension   . Breast cancer of upper-inner quadrant of left female breast 11/06/2014  . Anemia   . Thyroid disease   . GERD (gastroesophageal reflux disease)   . Diabetic neuropathy     slight in her feet  . Arthritis   . Glaucoma   . Diabetes mellitus     type 2     Past Surgical History  Procedure Laterality Date  . Abdominal hysterectomy    . Thyroidectomy    . Eye surgery Bilateral     cataract surgery with lens implant  . Colonoscopy    . Portacath placement Left 11/26/2014    Procedure: INSERTION PORT-A-CATH;  Surgeon: Excell Seltzer, MD;  Location: [place];  Service: General;  Laterality: Left;  . Portacath placement Left 11/26/2014    Procedure: REPOSITION OF A PORT-A-CATH;  Surgeon: Excell Seltzer, MD;  Location: [place];  Service: General;  Laterality: Left;  . Breast lumpectomy with sentinel lymph node biopsy Right 04/08/2015  . Breast lumpectomy with sentinel lymph node biopsy Right 04/08/2015    Procedure: RIGHT BREAST LUMPECTOMY WITH RIGHT AXILLARY SENTINEL LYMPH NODE BIOPSY;  Surgeon: Excell Seltzer, MD;  Location: [place];  Service: General;  Laterality: Right;  . Port-a-cath removal Left 04/08/2015    Procedure: REMOVAL PORT-A-CATH LEFT CHEST;  Surgeon: Excell Seltzer, MD;   Location: [place];  Service: General;  Laterality: Left;    There were no vitals filed for this visit.  Visit Diagnosis:  Lymphedema of breast  Stiffness of joint, shoulder region, right  Pain in joint, upper arm, right      Subjective Assessment - 06/12/15 1705    Subjective "I think I got it"   Pertinent History Brianna Valdez is a 81 year old history of right breast cancer triple negative disease who underwent neoadjuvant chemotherapy with weekly Taxol and carboplatin. She underwent lumpectomy on 04/08/2015.  She had full axillary lymph node dissection which revealed one positive lymph node.  Now in second week of radiation therapy.  HTN and diabetes controlled with meds.   Pain Score 1    Pain Location Arm   Pain Orientation Right                  Brianna Valdez - 06/12/15 0001    Open a tight or new jar No difficulty   Do heavy household chores (wash walls, wash floors) No difficulty   Carry a shopping bag or briefcase No difficulty   Wash your back No difficulty   Use a knife to cut food No difficulty   Recreational activities in which you take some force or impact through your arm, shoulder, or hand (golf, hammering, tennis) No difficulty   During the past week, to what extent has your arm,  shoulder or hand problem interfered with your normal social activities with family, friends, neighbors, or groups? Not at all   During the past week, to what extent has your arm, shoulder or hand problem limited your work or other regular daily activities Not at all   Arm, shoulder, or hand pain. Mild   Tingling (pins and needles) in your arm, shoulder, or hand Mild   Difficulty Sleeping No difficulty   DASH Score 4.55 %               [facility] Adult PT Treatment/Exercise - 06/12/15 0001    Self-Care   Self-Care Other Self-Care Comments   Other Self-Care Comments  provided patch of 1/2 foam and another of 1/4 foam for pt to use over firm incision after radiation.    Manual Therapy   Manual Lymphatic Drainage (MLD) In supine, short neck, left axilla and right groin, anterior interaxillary anastomosis and right axillo-inguinal anastomosis, and right breast, directing toward pathways; in left sidelying, right lateral breast and posterior interaxillary anastomosis right to left, and right axillo-inguinal anastomosis.then in sidelying, posterior interaxillary anastamosis and back                 PT Education - 06/12/15 1709    Education provided Yes   Education Details use compression in bra after radiation is complete.  ask for order to return to PT if still needing assist with swelling after radiation is complete   Person(s) Educated Patient   Methods Explanation   Comprehension Verbalized understanding                [facility] Goals - 06/12/15 1713    CC Long Term Goal  #1   Title Patient will be independent in self-manual lymph drainage for right breast   Status Achieved   CC Long Term Goal  #2   Title knowledgeable about where and how to obtain compression sleeve and bra   Status Achieved   CC Long Term Goal  #3   Title report decrease in right upper arm discomfort by at least 50%   Status Achieved   CC Long Term Goal  #4   Title right shoulder active flexion to at least 140 degrees for improved overhead reach   Status Deferred  forgot to measure   CC Long Term Goal  #5   Title right shoulder active abduction to at least 160 degrees for improved ADLs   Status Deferred  forgot to measure            Plan - 06/12/15 1711    Clinical Impression Statement pt continues to have firmness in right breast around area of incision, but this is area of radiated skin and manual lymph drainage skin stretch should be avoided.  Pt knows to do treatment to skin outside of radiation field.  she has some compression patches to use once radiation is complete and will be meausred for compression sleeves and bra next week,   Pt will  benefit from skilled therapeutic intervention in order to improve on the following deficits Decreased range of motion;Increased edema;Decreased knowledge of precautions;Decreased strength;Impaired UE functional use   Rehab Potential Excellent   Clinical Impairments Affecting Rehab Potential currently undergoing radiation treatment so therapy may have to be modified if patient has skin irritation or discomfort from this   PT Next Visit Plan discharge this visit.    Consulted and Agree with Plan of Care Patient  G-Codes - 06/12/15 1714    Functional Assessment Tool Used quick DASH   Functional Limitation Carrying, moving and handling objects   Carrying, Moving and Handling Objects Goal Status ([id number]) At least 1 percent but less than 20 percent impaired, limited or restricted   Carrying, Moving and Handling Objects Discharge Status [phone]) 0 percent impaired, limited or restricted      Problem List Patient Active Problem List   Diagnosis Date Noted  . Breast cancer 04/08/2015  . Breast cancer of upper-inner quadrant of right female breast 11/06/2014     PHYSICAL THERAPY DISCHARGE SUMMARY  Visits from Start of Care: 5  Current functional level related to goals / functional outcomes: Pt currently undergoing radiation and is able to perform self care independently   Remaining deficits: Lymphedema in right breast. Precaution against manual lymph drainage to this area as it is radiation field.  Pt may need more PT after radiation is complete   Education / Equipment: Self manual lymph driainage.  Compression sleeve and bra to be measured for next week Plan: Patient agrees to discharge.  Patient goals were partially met. Patient is being discharged due to                                                     ?????        Donato Heinz. Owens Shark, PT  06/12/2015, 5:19 PM  [place], [place],  [address] Phone: [phone]   Fax:  [phone]

## 2015-06-12 NOTE — Therapy (Deleted)
[place], [place], [address] Phone: [phone]   Fax:  [phone]  Patient Details  Name: Brianna Valdez MRN: [id number] Date of Birth: Feb 12, 1931 Referring Provider:  Thea Silversmith, MD  Encounter Date: 06/12/2015  Donato Heinz. Owens Shark PT   Norwood Levo 06/12/2015, 5:18 PM  [place], [place], [address] Phone: [phone]   Fax:  [phone]

## 2015-06-13 ENCOUNTER — Ambulatory Visit
Admission: RE | Admit: 2015-06-13 | Discharge: 2015-06-13 | Disposition: A | Discharge status: Home / Self Care | Payer: Medicare HMO | Source: Ambulatory Visit | Attending: Radiation Oncology | Admitting: Radiation Oncology

## 2015-06-13 DIAGNOSIS — Z510 Encounter for antineoplastic radiation therapy: Secondary | ICD-10-CM | POA: Diagnosis not present

## 2015-06-16 ENCOUNTER — Ambulatory Visit
Admission: RE | Admit: 2015-06-16 | Discharge: 2015-06-16 | Disposition: A | Discharge status: Home / Self Care | Payer: Medicare HMO | Source: Ambulatory Visit | Attending: Radiation Oncology | Admitting: Radiation Oncology

## 2015-06-16 DIAGNOSIS — Z510 Encounter for antineoplastic radiation therapy: Secondary | ICD-10-CM | POA: Diagnosis not present

## 2015-06-17 ENCOUNTER — Ambulatory Visit
Admission: RE | Admit: 2015-06-17 | Discharge: 2015-06-17 | Disposition: A | Discharge status: Home / Self Care | Payer: Medicare HMO | Source: Ambulatory Visit | Attending: Radiation Oncology | Admitting: Radiation Oncology

## 2015-06-17 ENCOUNTER — Ambulatory Visit: Payer: Medicare HMO | Admitting: Physical Therapy

## 2015-06-17 DIAGNOSIS — Z510 Encounter for antineoplastic radiation therapy: Secondary | ICD-10-CM | POA: Diagnosis not present

## 2015-06-18 ENCOUNTER — Ambulatory Visit: Payer: Medicare HMO | Admitting: Physical Therapy

## 2015-06-18 ENCOUNTER — Ambulatory Visit
Admission: RE | Admit: 2015-06-18 | Discharge: 2015-06-18 | Disposition: A | Discharge status: Home / Self Care | Payer: Medicare HMO | Source: Ambulatory Visit | Attending: Radiation Oncology | Admitting: Radiation Oncology

## 2015-06-18 DIAGNOSIS — Z510 Encounter for antineoplastic radiation therapy: Secondary | ICD-10-CM | POA: Diagnosis not present

## 2015-06-19 ENCOUNTER — Ambulatory Visit
Admission: RE | Admit: 2015-06-19 | Discharge: 2015-06-19 | Disposition: A | Discharge status: Home / Self Care | Payer: Medicare HMO | Source: Ambulatory Visit | Attending: Radiation Oncology | Admitting: Radiation Oncology

## 2015-06-19 ENCOUNTER — Encounter: Payer: Medicare HMO | Admitting: Physical Therapy

## 2015-06-19 DIAGNOSIS — Z510 Encounter for antineoplastic radiation therapy: Secondary | ICD-10-CM | POA: Diagnosis not present

## 2015-06-19 DIAGNOSIS — C50211 Malignant neoplasm of upper-inner quadrant of right female breast: Secondary | ICD-10-CM

## 2015-06-19 NOTE — Progress Notes (Signed)
Weekly Management Note Current Dose:41.4   Gy  Projected Dose: 61 Gy   Narrative:  The patient presents for routine under treatment assessment.  CBCT/MVCT images/Port film x-rays were reviewed.  The chart was checked. Doing well. Minimal skin changes. Saw on tx machine for electron set up.   Physical Findings: Minimal skin changes.   Impression:  The patient is tolerating radiation.  Plan:  Continue treatment as planned. Continue radiaplex.

## 2015-06-20 ENCOUNTER — Ambulatory Visit
Admission: RE | Admit: 2015-06-20 | Discharge: 2015-06-20 | Disposition: A | Discharge status: Home / Self Care | Payer: Medicare HMO | Source: Ambulatory Visit | Attending: Radiation Oncology | Admitting: Radiation Oncology

## 2015-06-20 DIAGNOSIS — Z510 Encounter for antineoplastic radiation therapy: Secondary | ICD-10-CM | POA: Diagnosis not present

## 2015-06-23 ENCOUNTER — Ambulatory Visit
Admission: RE | Admit: 2015-06-23 | Discharge: 2015-06-23 | Disposition: A | Discharge status: Home / Self Care | Payer: Medicare HMO | Source: Ambulatory Visit | Attending: Radiation Oncology | Admitting: Radiation Oncology

## 2015-06-23 ENCOUNTER — Ambulatory Visit: Payer: Medicare HMO | Admitting: Physical Therapy

## 2015-06-23 DIAGNOSIS — Z510 Encounter for antineoplastic radiation therapy: Secondary | ICD-10-CM | POA: Diagnosis not present

## 2015-06-24 ENCOUNTER — Encounter: Payer: Self-pay | Admitting: Radiation Oncology

## 2015-06-24 ENCOUNTER — Ambulatory Visit
Admission: RE | Admit: 2015-06-24 | Discharge: 2015-06-24 | Disposition: A | Discharge status: Home / Self Care | Payer: Medicare HMO | Source: Ambulatory Visit | Attending: Radiation Oncology | Admitting: Radiation Oncology

## 2015-06-24 VITALS — BP 109/51 | HR 80 | Resp 16 | Wt 154.6 lb

## 2015-06-24 DIAGNOSIS — Z510 Encounter for antineoplastic radiation therapy: Secondary | ICD-10-CM | POA: Diagnosis not present

## 2015-06-24 DIAGNOSIS — C50211 Malignant neoplasm of upper-inner quadrant of right female breast: Secondary | ICD-10-CM

## 2015-06-24 NOTE — Progress Notes (Signed)
Weekly Management Note Current Dose:47 Gy  Projected Dose: 61 Gy   Narrative:  The patient presents for routine under treatment assessment.  CBCT/MVCT images/Port film x-rays were reviewed.  The chart was checked. Doing well. Minimal skin changes. Skin irritated. Low energy.  Physical Findings: Minimal skin changes.   Impression:  The patient is tolerating radiation.  Plan:  Continue treatment as planned. Continue radiaplex. Take 1/2 tab of lisinopril starting tomorrow and reasses BP>

## 2015-06-24 NOTE — Progress Notes (Addendum)
Weight stable. Denies pain. Hyperpigmentation of right/treated breast noted without desquamation. Reports using radiaplex bid as directed. Reports fatigue. Concerned about low diastolic bp; question if patient should hold one of the two bp medications. Also, patient expresses concern about bp and how it could be affecting her energy level.  BP 109/51 mmHg  Pulse 80  Resp 16  Wt 154 lb 9.6 oz (70.126 kg)   Wt Readings from Last 3 Encounters:  06/24/15 154 lb 9.6 oz (70.126 kg)  06/10/15 152 lb 12.8 oz (69.31 kg)  06/03/15 153 lb 4.8 oz (69.536 kg)

## 2015-06-25 ENCOUNTER — Ambulatory Visit
Admission: RE | Admit: 2015-06-25 | Discharge: 2015-06-25 | Disposition: A | Discharge status: Home / Self Care | Payer: Medicare HMO | Source: Ambulatory Visit | Attending: Radiation Oncology | Admitting: Radiation Oncology

## 2015-06-25 DIAGNOSIS — Z510 Encounter for antineoplastic radiation therapy: Secondary | ICD-10-CM | POA: Diagnosis not present

## 2015-06-26 ENCOUNTER — Ambulatory Visit
Admission: RE | Admit: 2015-06-26 | Discharge: 2015-06-26 | Disposition: A | Discharge status: Home / Self Care | Payer: Medicare HMO | Source: Ambulatory Visit | Attending: Radiation Oncology | Admitting: Radiation Oncology

## 2015-06-26 ENCOUNTER — Ambulatory Visit: Payer: Medicare HMO | Admitting: Radiation Oncology

## 2015-06-26 DIAGNOSIS — Z510 Encounter for antineoplastic radiation therapy: Secondary | ICD-10-CM | POA: Diagnosis not present

## 2015-06-27 ENCOUNTER — Ambulatory Visit
Admission: RE | Admit: 2015-06-27 | Discharge: 2015-06-27 | Disposition: A | Discharge status: Home / Self Care | Payer: Medicare HMO | Source: Ambulatory Visit | Attending: Radiation Oncology | Admitting: Radiation Oncology

## 2015-06-27 DIAGNOSIS — Z510 Encounter for antineoplastic radiation therapy: Secondary | ICD-10-CM | POA: Diagnosis not present

## 2015-06-30 ENCOUNTER — Encounter: Payer: Medicare HMO | Admitting: Physical Therapy

## 2015-06-30 ENCOUNTER — Ambulatory Visit
Admission: RE | Admit: 2015-06-30 | Discharge: 2015-06-30 | Disposition: A | Discharge status: Home / Self Care | Payer: Medicare HMO | Source: Ambulatory Visit | Attending: Radiation Oncology | Admitting: Radiation Oncology

## 2015-06-30 DIAGNOSIS — Z510 Encounter for antineoplastic radiation therapy: Secondary | ICD-10-CM | POA: Diagnosis not present

## 2015-07-01 ENCOUNTER — Ambulatory Visit
Admission: RE | Admit: 2015-07-01 | Discharge: 2015-07-01 | Disposition: A | Discharge status: Home / Self Care | Payer: Medicare HMO | Source: Ambulatory Visit | Attending: Radiation Oncology | Admitting: Radiation Oncology

## 2015-07-01 ENCOUNTER — Other Ambulatory Visit: Payer: Self-pay | Admitting: Adult Health

## 2015-07-01 VITALS — BP 121/62 | HR 73 | Temp 98.1°F

## 2015-07-01 DIAGNOSIS — Z510 Encounter for antineoplastic radiation therapy: Secondary | ICD-10-CM | POA: Diagnosis not present

## 2015-07-01 DIAGNOSIS — C50211 Malignant neoplasm of upper-inner quadrant of right female breast: Secondary | ICD-10-CM

## 2015-07-01 NOTE — Progress Notes (Signed)
Weekly assessment of radiation to right [email or web address] 31 of 33 treatments.Mild discomfort and shooting pain which is normal.Hyperpigmentation greatest of axilla and mammary fold.continue with radiaplex twice daily. BP 121/62 mmHg  Pulse 73  Temp(Src) 98.1 F (36.7 C)

## 2015-07-01 NOTE — Progress Notes (Signed)
Weekly Management Note Current Dose:57 Gy  Projected Dose: 61 Gy   Narrative:  The patient presents for routine under treatment assessment.  CBCT/MVCT images/Port film x-rays were reviewed.  The chart was checked. Doing well. Minimal skin changes. Skin irritated.   Physical Findings: Minimal skin changes. Dark right breast and axilla.   Impression:  The patient is tolerating radiation.  Plan:  Continue treatment as planned. Continue radiaplex. Follow up in 1 month. Appt with med onc in January.

## 2015-07-02 ENCOUNTER — Encounter: Payer: Medicare HMO | Admitting: Physical Therapy

## 2015-07-02 ENCOUNTER — Ambulatory Visit
Admission: RE | Admit: 2015-07-02 | Discharge: 2015-07-02 | Disposition: A | Discharge status: Home / Self Care | Payer: Medicare HMO | Source: Ambulatory Visit | Attending: Radiation Oncology | Admitting: Radiation Oncology

## 2015-07-02 DIAGNOSIS — Z510 Encounter for antineoplastic radiation therapy: Secondary | ICD-10-CM | POA: Diagnosis not present

## 2015-07-03 ENCOUNTER — Encounter: Payer: Self-pay | Admitting: Radiation Oncology

## 2015-07-03 ENCOUNTER — Ambulatory Visit
Admission: RE | Admit: 2015-07-03 | Discharge: 2015-07-03 | Disposition: A | Discharge status: Home / Self Care | Payer: Medicare HMO | Source: Ambulatory Visit | Attending: Radiation Oncology | Admitting: Radiation Oncology

## 2015-07-03 DIAGNOSIS — C50211 Malignant neoplasm of upper-inner quadrant of right female breast: Secondary | ICD-10-CM

## 2015-07-03 DIAGNOSIS — Z510 Encounter for antineoplastic radiation therapy: Secondary | ICD-10-CM | POA: Diagnosis not present

## 2015-07-03 MED ORDER — RADIAPLEXRX EX GEL
Freq: Once | CUTANEOUS | Status: AC
  Administered 2015-07-03: 14:00:00 via TOPICAL

## 2015-07-03 NOTE — Progress Notes (Signed)
EOT, patient requested another tube radiaplex gel, has run out and given a tube radiaplex gel to patient 1:45 PM

## 2015-07-07 ENCOUNTER — Telehealth: Payer: Self-pay | Admitting: *Deleted

## 2015-07-07 NOTE — Progress Notes (Signed)
  Radiation Oncology         [phone] ________________________________  Name: Brianna Valdez MRN: [id number]  Date: 07/03/2015  DOB: Feb 27, 1931  End of Treatment Note  Diagnosis:   Breast cancer of upper-inner quadrant of right female breast [facility])   Staging form: Breast, AJCC 7th Edition     Clinical stage from 11/20/2014: Stage IIB (T2, N1, M0) - Unsigned     Pathologic: Stage IIB (T2(2), N1a, cM0) - Signed by Nicholas Lose, MD on 04/16/2015     Indication for treatment: Curative    Radiation treatment dates:   05/19/2015-07/03/2015  Site/dose:    Right breast / 45 Gray @ 1.8 Pearline Cables per fraction x 25 fractions Right SCLV/PAB / 45 Gy at 1.6v Gy per fraction x 25 fractions.  Right breast boost / 16 Gray at [employer] per fraction x 8 fractions  Beams/energy:  Opposed Tangents /  MV photons LAO and PA with 10 and 15 MV photons En face / 15 MeV electrons  Narrative: The patient tolerated radiation treatment relatively well.   She had minimal hyperpigmentation and skin darkening. She continued fatigue.   Plan: The patient has completed radiation treatment. The patient will return to radiation oncology clinic for routine followup in one month. I advised them to call or return sooner if they have any questions or concerns related to their recovery or treatment.  ------------------------------------------------  Thea Silversmith, MD

## 2015-07-07 NOTE — Telephone Encounter (Signed)
Spoke to pt to congratulate on completion of xrt. Pt relate doing well and without complaints. Encourage pt to call with questions needs. Received verbal understanding. Discussed survivorship program and appt in Dec to see Chestine Spore, NP.

## 2015-07-10 NOTE — Progress Notes (Addendum)
Radiation Oncology         [phone] ________________________________  Name: Brianna Valdez      MRN: [id number]          Date: 05/08/15              DOB: May 23, 1931  Optical Surface Tracking Plan:  Since intensity modulated radiotherapy (IMRT) and 3D conformal radiation treatment methods are predicated on accurate and precise positioning for treatment, intrafraction motion monitoring is medically necessary to ensure accurate and safe treatment delivery.  The ability to quantify intrafraction motion without excessive ionizing radiation dose can only be performed with optical surface tracking. Accordingly, surface imaging offers the opportunity to obtain 3D measurements of patient position throughout IMRT and 3D treatments without excessive radiation exposure.  I am ordering optical surface tracking for this patient's upcoming course of radiotherapy. ________________________________ Signature   Reference:   Ursula Alert, J, et al. Surface imaging-based analysis of intrafraction motion for breast radiotherapy patients.Journal of [place], n. 6, nov. 2014. ISSN [id number].   Available at: <http://www.jacmp.org/index.php/jacmp/article/view/4957>.

## 2015-07-10 NOTE — Progress Notes (Addendum)
Name: Brianna Valdez   MRN: [id number]  Date:  06/17/15   DOB: 1930-11-25  Status:outpatient    DIAGNOSIS: Breast cancer of upper-inner quadrant of right female breast [facility])   Staging form: Breast, AJCC 7th Edition     Clinical stage from 11/20/2014: Stage IIB (T2, N1, M0) - Unsigned     Pathologic: Stage IIB (T2(2), N1a, cM0) - Signed by Nicholas Lose, MD on 04/16/2015   CONSENT VERIFIED: yes   SET UP: Patient is setup supine   IMMOBILIZATION:  The following immobilization was used:Custom Moldable Pillow, breast board.   NARRATIVE: Brianna Valdez underwent complex simulation and treatment planning for her boost treatment today.  Her tumor volume was outlined on the planning CT scan. The depth of her cavity was felt to be appropriate for treatment with electrons    15  MeV electrons will be prescribed to the 100%  isodose line.   I personally oversaw and approved the construction of a unique block which will be used for beam modification purposes.  An isodose plan is requested.

## 2015-08-06 ENCOUNTER — Encounter: Payer: Self-pay | Admitting: Radiation Oncology

## 2015-08-06 ENCOUNTER — Ambulatory Visit
Admission: RE | Admit: 2015-08-06 | Discharge: 2015-08-06 | Disposition: A | Discharge status: Home / Self Care | Payer: Medicare Other | Source: Ambulatory Visit | Attending: Radiation Oncology | Admitting: Radiation Oncology

## 2015-08-06 VITALS — BP 138/61 | HR 79 | Resp 16 | Wt 153.8 lb

## 2015-08-06 DIAGNOSIS — C50211 Malignant neoplasm of upper-inner quadrant of right female breast: Secondary | ICD-10-CM

## 2015-08-06 NOTE — Progress Notes (Addendum)
Weight and vitals stable. Denies pain. States, "its hard for me to say my energy level is improving because I am just getting over a cold." No lymphedema of right arm noted. Reports wearing a compression sleeve most days. Explains she was in a rush this morning and didn't apply her sleeve as she normally does. Hyperpigmentation without desqaumation of right/treated breast noted. Denies nipple discharge. Denies palpating any new abnormalities of treated breast. Understands that medical oncology will contact her after the first of the year to arrange her follow up with Dr. Lindi Adie. Patient denies taking any anti-estrogen therapy at this time. [facility] and [employer] given and reviewed.   BP 138/61 mmHg  Pulse 79  Resp 16  Wt 153 lb 12.8 oz (69.763 kg)  SpO2 100% Wt Readings from Last 3 Encounters:  08/06/15 153 lb 12.8 oz (69.763 kg)  06/24/15 154 lb 9.6 oz (70.126 kg)  06/10/15 152 lb 12.8 oz (69.31 kg)

## 2015-08-06 NOTE — Progress Notes (Signed)
   Department of Radiation Oncology  Phone:  [phone] Fax:        [phone]   Name: [name] MRN: [id number]  DOB: 29-Apr-1931  Date: 08/06/2015  Follow Up Visit Note  Diagnosis: Breast cancer of upper-inner quadrant of right female breast [facility])   Staging form: Breast, AJCC 7th Edition     Clinical stage from 11/20/2014: Stage IIB (T2, N1, M0) - Unsigned     Pathologic: Stage IIB (T2(2), N1a, cM0) - Signed by Nicholas Lose, MD on 04/16/2015   Summary and Interval since last radiation:  05/19/2015-07/03/2015   Interval History: Brianna Valdez presents today for routine follow up.  Weight and vitals stable. Denies pain. States, "it's hard for me to say my energy level is improving because I am just getting over a cold." No lymphedema of right arm noted. Reports wearing a compression sleeve most days. Explains she was in a rush this morning and didn't apply her sleeve as she normally does. Hyperpigmentation without desqaumation of right/treated breast noted. Denies nipple discharge. Denies palpating any new abnormalities of treated breast. Understands that medical oncology will contact her after the first of the year to arrange her follow up with Dr. Lindi Adie. Patient denies taking any anti-estrogen therapy at this time. [facility] and [employer] given and reviewed.  Physical Exam:  Filed Vitals:   08/06/15 1106  BP: 138/61  Pulse: 79  Resp: 16  Weight: 153 lb 12.8 oz (69.763 kg)  SpO2: 100%  Breast: Lymphedema of breast with skin darkening.  IMPRESSION: Brianna Valdez is an 81 y.o. female with Breast cancer of upper-inner quadrant of right female breast (Brianna Valdez).  PLAN:  She is doing well. We discussed the need for follow up every 4-6 months which she has scheduled.  We discussed the need for yearly mammograms which she can schedule with her OBGYN or with medical oncology. We discussed the need for sun protection in the treated area.  She can always call me with questions.  I will  follow up with her on an as needed basis. She has been referred to survivorship and [facility].   She has been seen my physical therapy and they have discussed and taught her lymphatic drainage of the breast and arm. She has a bra with appropriate support to help with this issue. Thea Silversmith, MD   This document serves as a record of services personally performed by Thea Silversmith, MD. It was created on her behalf by  Lendon Collar, a trained medical scribe. The creation of this record is based on the scribe's personal observations and the provider's statements to them. This document has been checked and approved by the attending provider.

## 2015-08-13 ENCOUNTER — Telehealth: Payer: Self-pay

## 2015-08-13 NOTE — Telephone Encounter (Signed)
Returned call to Bruno to return call to clinic.

## 2015-08-13 NOTE — Addendum Note (Signed)
Encounter addended by: Benn Moulder, RN on: 08/13/2015  8:54 AM<BR>     Documentation filed: Demographics Visit

## 2015-08-15 ENCOUNTER — Telehealth: Payer: Self-pay

## 2015-08-15 DIAGNOSIS — C50211 Malignant neoplasm of upper-inner quadrant of right female breast: Secondary | ICD-10-CM

## 2015-08-15 NOTE — Telephone Encounter (Signed)
Daughter Threasa Alpha called re: pt ambulation issues.  Pt has fallen 3 times within the last two weeks.  Is requesting home health referral to [facility].    Referral order and pof placed.  FYI routed to MD team.

## 2015-08-17 ENCOUNTER — Telehealth: Payer: Self-pay | Admitting: Hematology and Oncology

## 2015-08-17 NOTE — Telephone Encounter (Signed)
Referral for home health noted and sent an inbox to terri f and [place] that schedules do not set up home health

## 2015-08-19 DIAGNOSIS — H353131 Nonexudative age-related macular degeneration, bilateral, early dry stage: Secondary | ICD-10-CM | POA: Diagnosis not present

## 2015-08-19 DIAGNOSIS — H401131 Primary open-angle glaucoma, bilateral, mild stage: Secondary | ICD-10-CM | POA: Diagnosis not present

## 2015-08-20 DIAGNOSIS — Z9181 History of falling: Secondary | ICD-10-CM | POA: Diagnosis not present

## 2015-08-20 DIAGNOSIS — M6281 Muscle weakness (generalized): Secondary | ICD-10-CM | POA: Diagnosis not present

## 2015-08-20 DIAGNOSIS — Z7984 Long term (current) use of oral hypoglycemic drugs: Secondary | ICD-10-CM | POA: Diagnosis not present

## 2015-08-20 DIAGNOSIS — C50211 Malignant neoplasm of upper-inner quadrant of right female breast: Secondary | ICD-10-CM | POA: Diagnosis not present

## 2015-08-20 DIAGNOSIS — E119 Type 2 diabetes mellitus without complications: Secondary | ICD-10-CM | POA: Diagnosis not present

## 2015-08-20 DIAGNOSIS — I10 Essential (primary) hypertension: Secondary | ICD-10-CM | POA: Diagnosis not present

## 2015-08-22 DIAGNOSIS — Z9181 History of falling: Secondary | ICD-10-CM | POA: Diagnosis not present

## 2015-08-22 DIAGNOSIS — M6281 Muscle weakness (generalized): Secondary | ICD-10-CM | POA: Diagnosis not present

## 2015-08-22 DIAGNOSIS — Z7984 Long term (current) use of oral hypoglycemic drugs: Secondary | ICD-10-CM | POA: Diagnosis not present

## 2015-08-22 DIAGNOSIS — E119 Type 2 diabetes mellitus without complications: Secondary | ICD-10-CM | POA: Diagnosis not present

## 2015-08-22 DIAGNOSIS — I10 Essential (primary) hypertension: Secondary | ICD-10-CM | POA: Diagnosis not present

## 2015-08-22 DIAGNOSIS — C50211 Malignant neoplasm of upper-inner quadrant of right female breast: Secondary | ICD-10-CM | POA: Diagnosis not present

## 2015-08-25 DIAGNOSIS — Z9181 History of falling: Secondary | ICD-10-CM | POA: Diagnosis not present

## 2015-08-25 DIAGNOSIS — C50211 Malignant neoplasm of upper-inner quadrant of right female breast: Secondary | ICD-10-CM | POA: Diagnosis not present

## 2015-08-25 DIAGNOSIS — E119 Type 2 diabetes mellitus without complications: Secondary | ICD-10-CM | POA: Diagnosis not present

## 2015-08-25 DIAGNOSIS — I10 Essential (primary) hypertension: Secondary | ICD-10-CM | POA: Diagnosis not present

## 2015-08-25 DIAGNOSIS — Z7984 Long term (current) use of oral hypoglycemic drugs: Secondary | ICD-10-CM | POA: Diagnosis not present

## 2015-08-25 DIAGNOSIS — M6281 Muscle weakness (generalized): Secondary | ICD-10-CM | POA: Diagnosis not present

## 2015-09-01 DIAGNOSIS — Z9181 History of falling: Secondary | ICD-10-CM | POA: Diagnosis not present

## 2015-09-01 DIAGNOSIS — M6281 Muscle weakness (generalized): Secondary | ICD-10-CM | POA: Diagnosis not present

## 2015-09-01 DIAGNOSIS — Z7984 Long term (current) use of oral hypoglycemic drugs: Secondary | ICD-10-CM | POA: Diagnosis not present

## 2015-09-01 DIAGNOSIS — C50211 Malignant neoplasm of upper-inner quadrant of right female breast: Secondary | ICD-10-CM | POA: Diagnosis not present

## 2015-09-01 DIAGNOSIS — I10 Essential (primary) hypertension: Secondary | ICD-10-CM | POA: Diagnosis not present

## 2015-09-01 DIAGNOSIS — E119 Type 2 diabetes mellitus without complications: Secondary | ICD-10-CM | POA: Diagnosis not present

## 2015-09-03 DIAGNOSIS — M6281 Muscle weakness (generalized): Secondary | ICD-10-CM | POA: Diagnosis not present

## 2015-09-03 DIAGNOSIS — E119 Type 2 diabetes mellitus without complications: Secondary | ICD-10-CM | POA: Diagnosis not present

## 2015-09-03 DIAGNOSIS — Z9181 History of falling: Secondary | ICD-10-CM | POA: Diagnosis not present

## 2015-09-03 DIAGNOSIS — C50211 Malignant neoplasm of upper-inner quadrant of right female breast: Secondary | ICD-10-CM | POA: Diagnosis not present

## 2015-09-03 DIAGNOSIS — I10 Essential (primary) hypertension: Secondary | ICD-10-CM | POA: Diagnosis not present

## 2015-09-03 DIAGNOSIS — Z7984 Long term (current) use of oral hypoglycemic drugs: Secondary | ICD-10-CM | POA: Diagnosis not present

## 2015-09-08 DIAGNOSIS — I10 Essential (primary) hypertension: Secondary | ICD-10-CM | POA: Diagnosis not present

## 2015-09-08 DIAGNOSIS — Z9181 History of falling: Secondary | ICD-10-CM | POA: Diagnosis not present

## 2015-09-08 DIAGNOSIS — Z7984 Long term (current) use of oral hypoglycemic drugs: Secondary | ICD-10-CM | POA: Diagnosis not present

## 2015-09-08 DIAGNOSIS — E119 Type 2 diabetes mellitus without complications: Secondary | ICD-10-CM | POA: Diagnosis not present

## 2015-09-08 DIAGNOSIS — M6281 Muscle weakness (generalized): Secondary | ICD-10-CM | POA: Diagnosis not present

## 2015-09-08 DIAGNOSIS — C50211 Malignant neoplasm of upper-inner quadrant of right female breast: Secondary | ICD-10-CM | POA: Diagnosis not present

## 2015-09-10 DIAGNOSIS — E119 Type 2 diabetes mellitus without complications: Secondary | ICD-10-CM | POA: Diagnosis not present

## 2015-09-10 DIAGNOSIS — C50211 Malignant neoplasm of upper-inner quadrant of right female breast: Secondary | ICD-10-CM | POA: Diagnosis not present

## 2015-09-10 DIAGNOSIS — Z7984 Long term (current) use of oral hypoglycemic drugs: Secondary | ICD-10-CM | POA: Diagnosis not present

## 2015-09-10 DIAGNOSIS — M6281 Muscle weakness (generalized): Secondary | ICD-10-CM | POA: Diagnosis not present

## 2015-09-10 DIAGNOSIS — Z9181 History of falling: Secondary | ICD-10-CM | POA: Diagnosis not present

## 2015-09-10 DIAGNOSIS — I10 Essential (primary) hypertension: Secondary | ICD-10-CM | POA: Diagnosis not present

## 2015-09-17 DIAGNOSIS — M6281 Muscle weakness (generalized): Secondary | ICD-10-CM | POA: Diagnosis not present

## 2015-09-17 DIAGNOSIS — C50211 Malignant neoplasm of upper-inner quadrant of right female breast: Secondary | ICD-10-CM | POA: Diagnosis not present

## 2015-09-17 DIAGNOSIS — Z7984 Long term (current) use of oral hypoglycemic drugs: Secondary | ICD-10-CM | POA: Diagnosis not present

## 2015-09-17 DIAGNOSIS — E119 Type 2 diabetes mellitus without complications: Secondary | ICD-10-CM | POA: Diagnosis not present

## 2015-09-17 DIAGNOSIS — Z9181 History of falling: Secondary | ICD-10-CM | POA: Diagnosis not present

## 2015-09-17 DIAGNOSIS — I10 Essential (primary) hypertension: Secondary | ICD-10-CM | POA: Diagnosis not present

## 2015-09-18 ENCOUNTER — Encounter: Payer: Self-pay | Admitting: Nurse Practitioner

## 2015-09-18 ENCOUNTER — Ambulatory Visit (HOSPITAL_BASED_OUTPATIENT_CLINIC_OR_DEPARTMENT_OTHER): Payer: Medicare Other | Admitting: Nurse Practitioner

## 2015-09-18 VITALS — BP 134/46 | HR 94 | Temp 98.8°F | Resp 18 | Ht 62.0 in | Wt 153.7 lb

## 2015-09-18 DIAGNOSIS — C50911 Malignant neoplasm of unspecified site of right female breast: Secondary | ICD-10-CM

## 2015-09-18 DIAGNOSIS — I890 Lymphedema, not elsewhere classified: Secondary | ICD-10-CM

## 2015-09-18 DIAGNOSIS — C50211 Malignant neoplasm of upper-inner quadrant of right female breast: Secondary | ICD-10-CM

## 2015-09-18 NOTE — Progress Notes (Signed)
CLINIC:  Cancer Survivorship   REASON FOR VISIT:  Routine follow-up post-treatment for a recent history of breast cancer.  BRIEF ONCOLOGIC HISTORY:    Breast cancer of upper-inner quadrant of right female breast (Brianna Valdez)   10/22/2014 Mammogram Highly suspicious 2.6 cm mass in the upper inner quadrant of the right breast at the 2 o'clock , Pathologic right axillary lymph nodes, including a node in the mid axillary line with eccentric cortical thickening up to 5 mm   11/01/2014 Initial Biopsy Right breast biopsy 2:00: IDC with DCIS, ER/PR 0%, Ki-67 39%, HER-2 negative; right axillary lymph node biopsy: Positive for IDC ER/PR 0%, 43%, HER-2 Negative Ratio 0.94   11/15/2014 Breast MRI Right breast: total extent of disease in upper inner portion including adjacent satellite nodules / adjacent linear non mass enhancement measuring up to 4.8 cm. Close to pectoralis. Lymph nodes with mild irregularity/thickened cortices in the right axilla   11/15/2014 Clinical Stage Stage IIB: T2 N1   12/10/2014 - 02/25/2015 Neo-Adjuvant Chemotherapy Weekly paclitaxel and carboplatin 12   03/04/2015 Breast MRI Interval decrease in size of enhancing mass within the upper inner right breast posterior depth. Adjacent nodules and non mass enhancement are grossly similar when compared to prior exam   04/08/2015 Definitive Surgery Right lumpectomy/ALD [facility]): Multifocal invasive ductal carcinoma 2 foci 2.5 cm, 0.5 cm with DCIS, margins negative closest 0.1 cm, 1/33 lymph nodes positive    04/08/2015 Pathologic Stage Stage IIB: ypT2(m) ypN1a   05/19/2015 - 07/03/2015 Radiation Therapy Adjuvant RT Pablo Ledger): Right breast / 45 Gray @ 1.8 Pearline Cables per fraction x 25 fractions; Right SCLV/PAB / 45 Gy at 1.6v Gy per fraction x 25 fractions; Right breast boost / 16 Gray at [employer] per fraction x 8 fractions. Total dose: 61 Gy.    INTERVAL HISTORY:  Brianna Valdez to the [facility] today for our initial meeting to review her  survivorship care plan detailing her treatment course for breast cancer, as well as monitoring long-term side effects of that treatment, education regarding health maintenance, screening, and overall wellness and health promotion.     Overall, Brianna Valdez feeling quite well since completing her radiation therapy approximately three months ago.  She continues with fatigue and Valdez that the skin changes overlying her right breast have improved greatly.  She has had some lymphedema in her right arm and wears a compression device for it.  She denies pain or heaviness, rather stating it is an intermittient "sensation." She has also been seen and undergone therapy with physical therapy.  She denies headache, cough, shortness of breath or bone pain.  She has a good appetite and denies any weight loss.  She denies any change along her breast.  REVIEW OF SYSTEMS:  General: Fatigue as above. Denies fever, chills, unintentional weight loss, or night sweats.  HEENT: Wears glasses. Denies visual changes, hearing loss, mouth sores or difficulty swallowing. Cardiac: Denies palpitations, chest pain, and lower extremity edema.  Respiratory: Denies wheeze or dyspnea on exertion.  Breast: Denies any new nodularity, masses, tenderness, nipple changes, or nipple discharge.  GI: Denies abdominal pain, constipation, diarrhea, nausea, or vomiting.  GU: Denies dysuria, hematuria, vaginal bleeding, vaginal discharge, or vaginal dryness.  Musculoskeletal: Denies joint or bone pain.  Neuro: Denies recent fall or numbness / tingling in her extremities. Skin: Denies rash, pruritis, or open wounds.  Psych: Denies depression, anxiety, insomnia, or memory loss.   A 14-point review of systems was completed and was negative, except  as noted above.   ONCOLOGY TREATMENT TEAM:  1. Surgeon:  Dr. Excell Seltzer at [facility]  2. Medical Oncologist: Dr. Lindi Adie 3. Radiation Oncologist: Dr. Pablo Ledger    PAST  MEDICAL/SURGICAL HISTORY:  Past Medical History  Diagnosis Date  . Hypertension   . Breast cancer of upper-inner quadrant of left female breast ([place]) 11/06/2014  . Anemia   . Thyroid disease   . GERD (gastroesophageal reflux disease)   . Diabetic neuropathy (HCC)     slight in her feet  . Arthritis   . Glaucoma   . Diabetes mellitus     type 2    Past Surgical History  Procedure Laterality Date  . Abdominal hysterectomy    . Thyroidectomy    . Eye surgery Bilateral     cataract surgery with lens implant  . Colonoscopy    . Portacath placement Left 11/26/2014    Procedure: INSERTION PORT-A-CATH;  Surgeon: Excell Seltzer, MD;  Location: [place];  Service: General;  Laterality: Left;  . Portacath placement Left 11/26/2014    Procedure: REPOSITION OF A PORT-A-CATH;  Surgeon: Excell Seltzer, MD;  Location: [place];  Service: General;  Laterality: Left;  . Breast lumpectomy with sentinel lymph node biopsy Right 04/08/2015  . Breast lumpectomy with sentinel lymph node biopsy Right 04/08/2015    Procedure: RIGHT BREAST LUMPECTOMY WITH RIGHT AXILLARY SENTINEL LYMPH NODE BIOPSY;  Surgeon: Excell Seltzer, MD;  Location: [place];  Service: General;  Laterality: Right;  . Port-a-cath removal Left 04/08/2015    Procedure: REMOVAL PORT-A-CATH LEFT CHEST;  Surgeon: Excell Seltzer, MD;  Location: [place];  Service: General;  Laterality: Left;     ALLERGIES:  No Known Allergies   CURRENT MEDICATIONS:  Current Outpatient Prescriptions on File Prior to Visit  Medication Sig Dispense Refill  . amLODipine (NORVASC) 2.5 MG tablet Take 2.5 mg by mouth daily.    . brimonidine (ALPHAGAN) 0.15 % ophthalmic solution Place 1 drop into both eyes 2 (two) times daily.    Marland Kitchen glimepiride (AMARYL) 1 MG tablet Take 1 mg by mouth daily with breakfast.   6  . hyaluronate sodium (RADIAPLEXRX) GEL Apply 1 application topically 2 (two) times daily.    Marland Kitchen levothyroxine (SYNTHROID, LEVOTHROID) 50 MCG tablet Take 50 mcg  by mouth daily before breakfast.    . lisinopril (PRINIVIL,ZESTRIL) 40 MG tablet Take 40 mg by mouth daily.    . LUTEIN PO Take 25 mg by mouth daily.    . Magnesium 250 MG TABS Take 250 mg by mouth daily with supper.    . metFORMIN (GLUCOPHAGE) 500 MG tablet Take 500-1,000 mg by mouth 2 (two) times daily with a meal. Take 2 tablets (1000 mg) with breakfast and 1 tablet (500 mg) with supper    . Misc Natural Products (OSTEO BI-FLEX JOINT SHIELD PO) Take 1 tablet by mouth daily with supper.     . Multiple Vitamin (MULTIVITAMIN WITH MINERALS) TABS tablet Take by mouth 2 (two) times daily. 1 packet of Daily Advantage Vitamins twice daily (minus the fish oil capsule)    . OVER THE COUNTER MEDICATION Take 1 tablet by mouth daily with breakfast. Blood Builder    . OVER THE COUNTER MEDICATION Take 1 tablet by mouth daily with breakfast. Neuro Support Plus    . OVER THE COUNTER MEDICATION Take 1 tablet by mouth at bedtime as needed (sleep). Pure Sleep    . OVER THE COUNTER MEDICATION Sun chlorella Takes 4-6 tabs every morning after breakfast    .  Probiotic Product (PROBIOTIC DAILY PO) Take 1 tablet by mouth daily with supper. Probiotic Advantage Ultra 20    . UNABLE TO FIND 1 each by Other route as needed. Dispense per medical necessity class 1 compression sleeve 20-28m/Hg and gauntlet for hx of breast cancer s/p surgery 1 each 1   No current facility-administered medications on file prior to visit.     ONCOLOGIC FAMILY HISTORY:  No family history on file.   GENETIC COUNSELING/TESTING: No    SOCIAL HISTORY:  JEvelenSilverthorne is married [place]  She has 4 children and 7 grandchildren. Ms. 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  She denies any current or history of tobacco, alcohol, or illicit drug use.     PHYSICAL EXAMINATION:  Vital Signs: Filed Vitals:   09/18/15 1510  BP: 134/46  Pulse: 94    Temp: 98.8 F (37.1 C)  Resp: 18   ECOG Performance Status: 0  General: Well-nourished, well-appearing female in no acute distress.  She is unaccompanied in clinic today.   HEENT: Wears glasses. Head is atraumatic and normocephalic.  Pupils equal and reactive to light and accomodation. Conjunctivae clear without exudate.  Sclerae anicteric. Oral mucosa is pink, moist, and intact without lesions.  Oropharynx is pink without lesions or erythema.  Lymph: No cervical, supraclavicular, infraclavicular, or axillary lymphadenopathy noted on palpation.  Cardiovascular: Regular rate and rhythm without murmurs, rubs, or gallops. Respiratory: Clear to auscultation bilaterally. Chest expansion symmetric without accessory muscle use on inspiration or expiration.  GI: Abdomen soft and round. No tenderness to palpation. Bowel sounds normoactive in 4 quadrants. GU: Deferred.  Musculoskeletal: Muscle strength 5/5 in all extremities.   Neuro: No focal deficits. Steady gait.  Psych: Mood and affect normal and appropriate for situation.  Extremities: No edema, cyanosis, or clubbing.  Skin: Warm and dry. No open lesions noted.   LABORATORY DATA:  None for this visit.  DIAGNOSTIC IMAGING:  None for this visit.     ASSESSMENT AND PLAN:   1. Breast cancer: Stage IIB invasive ductal carcinoma of the right breast with DCIS, triple negative,  S/P neoadjuvant chemotherapy with paclitaxel and carboplatin with decrease in size, followed by right lumpectomy/ALD followed by adjuvant radiation therapy to right breast, supraclaviular area and axilla.  Brianna Valdez doing well without clinical symptoms worrisome for disease recurrence. She will follow-up with her medical oncologist,  Dr. GLindi Adie in January 2017 as well as Dr. HExcell Seltzerin January 2017 with history and physical examination per surveillance protocol. A comprehensive survivorship care plan and treatment summary was reviewed with the patient today  detailing her breast cancer diagnosis, treatment course, potential late/long-term effects of treatment, appropriate follow-up care with recommendations for the future, and patient education resources.  A copy of this summary, along with a letter will be sent to the patient's primary care provider via in basket message after today's visit.  Ms. SGrajedais welcome to return to the Survivorship Clinic in the future, as needed; no follow-up will be scheduled at this time.  She has misplaced her lymphatic drainage exercise sheets and we will reach out to the Physical Therapy department to send her additional copies.  2. Cancer screening:  Due to Brianna Valdez history and her age, she should receive screening for skin cancers and gynecologic cancers. She no longer undergoes colonoscopy. The information and recommendations are listed on the patient's comprehensive care plan/treatment summary and were  reviewed in detail with the patient.    3. Health maintenance and wellness promotion: Brianna Valdez was encouraged to consume 5-7 servings of fruits and vegetables per day. We reviewed the "Nutrition Rainbow" handout, as well as discussed recommendations to maximize nutrition and minimize recurrence, such as increased intake of fruits, vegetables, lean proteins, and minimizing the intake of red meats and processed foods.  She was also encouraged to engage in moderate to vigorous exercise for 30 minutes per day most days of the week. We discussed the [employer] fitness program, which is designed for cancer survivors to help them become more physically fit after cancer treatments.  She was instructed to limit her alcohol consumption and continue to abstain from tobacco use.  A copy of the "Take Control of Your Health" brochure was given to her reinforcing these recommendations.   4. Support services/counseling: It is not uncommon for this period of the patient's cancer care trajectory to be one of many  emotions and stressors.  We discussed an opportunity for her to participate in the next session of [facility] ("Finding Your New Normal") support group series designed for patients after they have completed treatment.   Brianna Valdez was encouraged to take advantage of our many other support services programs, support groups, and/or counseling in coping with her new life as a cancer survivor after completing anti-cancer treatment.  She was offered support today through active listening and expressive supportive counseling.  She was given information regarding our available services and encouraged to contact me with any questions or for help enrolling in any of our support group/programs.    A total of 50 minutes of face-to-face time was spent with this patient with greater than 50% of that time in counseling and care-coordination.   Sylvan Cheese, NP  Survivorship Program [facility] [phone]   Note: PRIMARY CARE PROVIDER Gennette Pac, [place] [phone]

## 2015-09-19 DIAGNOSIS — Z9181 History of falling: Secondary | ICD-10-CM | POA: Diagnosis not present

## 2015-09-19 DIAGNOSIS — I10 Essential (primary) hypertension: Secondary | ICD-10-CM | POA: Diagnosis not present

## 2015-09-19 DIAGNOSIS — E119 Type 2 diabetes mellitus without complications: Secondary | ICD-10-CM | POA: Diagnosis not present

## 2015-09-19 DIAGNOSIS — M6281 Muscle weakness (generalized): Secondary | ICD-10-CM | POA: Diagnosis not present

## 2015-09-19 DIAGNOSIS — C50211 Malignant neoplasm of upper-inner quadrant of right female breast: Secondary | ICD-10-CM | POA: Diagnosis not present

## 2015-09-19 DIAGNOSIS — Z7984 Long term (current) use of oral hypoglycemic drugs: Secondary | ICD-10-CM | POA: Diagnosis not present

## 2015-09-22 DIAGNOSIS — I10 Essential (primary) hypertension: Secondary | ICD-10-CM | POA: Diagnosis not present

## 2015-09-22 DIAGNOSIS — M6281 Muscle weakness (generalized): Secondary | ICD-10-CM | POA: Diagnosis not present

## 2015-09-22 DIAGNOSIS — Z9181 History of falling: Secondary | ICD-10-CM | POA: Diagnosis not present

## 2015-09-22 DIAGNOSIS — C50211 Malignant neoplasm of upper-inner quadrant of right female breast: Secondary | ICD-10-CM | POA: Diagnosis not present

## 2015-09-22 DIAGNOSIS — Z7984 Long term (current) use of oral hypoglycemic drugs: Secondary | ICD-10-CM | POA: Diagnosis not present

## 2015-09-22 DIAGNOSIS — E119 Type 2 diabetes mellitus without complications: Secondary | ICD-10-CM | POA: Diagnosis not present

## 2015-09-24 DIAGNOSIS — I10 Essential (primary) hypertension: Secondary | ICD-10-CM | POA: Diagnosis not present

## 2015-09-24 DIAGNOSIS — M6281 Muscle weakness (generalized): Secondary | ICD-10-CM | POA: Diagnosis not present

## 2015-09-24 DIAGNOSIS — C50211 Malignant neoplasm of upper-inner quadrant of right female breast: Secondary | ICD-10-CM | POA: Diagnosis not present

## 2015-09-24 DIAGNOSIS — Z9181 History of falling: Secondary | ICD-10-CM | POA: Diagnosis not present

## 2015-09-24 DIAGNOSIS — Z7984 Long term (current) use of oral hypoglycemic drugs: Secondary | ICD-10-CM | POA: Diagnosis not present

## 2015-09-24 DIAGNOSIS — E119 Type 2 diabetes mellitus without complications: Secondary | ICD-10-CM | POA: Diagnosis not present

## 2015-10-14 NOTE — Assessment & Plan Note (Signed)
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 ER 0%, PR 0%, HER-2 negative Ki-67 39%, 2.6 cm mass Right axillary lymph node biopsy positive for invasive ductal carcinoma ER 0% PR 0% HER-2 negative Ki-67 43% Clinical stage: T2 N1 M0 stage IIB Treatment summary: Neoadjuvant chemotherapy with weekly Taxol and carboplatin 12 started 12/10/2014 completed 02/25/2015 Right lumpectomy 04/08/2015 : Multifocal invasive ductal carcinoma 2 foci 2.5 cm, 0.5 cm with DCIS, margins negative closes 0.1 cm, 1/33 lymph nodes positive T2 N1 a M0 stage IIB pathologic stage ------------------------------------------------------------------------------------------------------------------------------------------------------------ Pathology counseling: I discussed the final pathology report and provided her with a copy of this result. Final pathologic stage is T2 N1 stage IIB. Adjuvant XRT from 05/19/15 to 07/03/15  Treatment plan: Surveillance  Return to clinic in 6 months for follow-up blood work

## 2015-10-15 ENCOUNTER — Telehealth: Payer: Self-pay | Admitting: Hematology and Oncology

## 2015-10-15 ENCOUNTER — Encounter: Payer: Self-pay | Admitting: Hematology and Oncology

## 2015-10-15 ENCOUNTER — Ambulatory Visit (HOSPITAL_BASED_OUTPATIENT_CLINIC_OR_DEPARTMENT_OTHER): Payer: Medicare Other | Admitting: Hematology and Oncology

## 2015-10-15 VITALS — BP 126/59 | HR 83 | Temp 98.4°F | Resp 18 | Ht 62.0 in | Wt 154.4 lb

## 2015-10-15 DIAGNOSIS — C50211 Malignant neoplasm of upper-inner quadrant of right female breast: Secondary | ICD-10-CM

## 2015-10-15 DIAGNOSIS — I890 Lymphedema, not elsewhere classified: Secondary | ICD-10-CM

## 2015-10-15 DIAGNOSIS — C773 Secondary and unspecified malignant neoplasm of axilla and upper limb lymph nodes: Secondary | ICD-10-CM | POA: Diagnosis not present

## 2015-10-15 NOTE — Addendum Note (Signed)
Addended by: Prentiss Bells on: 10/15/2015 06:09 PM   Modules accepted: Medications

## 2015-10-15 NOTE — Telephone Encounter (Signed)
Appointments made and avs pritned for patient °

## 2015-10-15 NOTE — Progress Notes (Signed)
Patient Care Team: Hulan Fess, MD as PCP - General Excell Seltzer, MD as Consulting Physician (General Surgery) Nicholas Lose, MD as Consulting Physician (Hematology and Oncology) Thea Silversmith, MD as Consulting Physician (Radiation Oncology) Rockwell Germany, RN as Registered Nurse Mauro Kaufmann, RN as Registered Nurse Sylvan Cheese, NP as Nurse Practitioner (Hematology and Oncology)  DIAGNOSIS: Breast cancer of upper-inner quadrant of right female breast [facility])   Staging form: Breast, AJCC 7th Edition     Clinical stage from 11/20/2014: Stage IIB (T2, N1, M0) - Unsigned     Pathologic: Stage IIB (T2(2), N1a, cM0) - Signed by Nicholas Lose, MD on 04/16/2015   SUMMARY OF ONCOLOGIC HISTORY:   Breast cancer of upper-inner quadrant of right female breast ([place])   10/22/2014 Mammogram Highly suspicious 2.6 cm mass in the upper inner quadrant of the right breast at the 2 o'clock , Pathologic right axillary lymph nodes, including a node in the mid axillary line with eccentric cortical thickening up to 5 mm   11/01/2014 Initial Biopsy Right breast biopsy 2:00: IDC with DCIS, ER/PR 0%, Ki-67 39%, HER-2 negative; right axillary lymph node biopsy: Positive for IDC ER/PR 0%, 43%, HER-2 Negative Ratio 0.94   11/15/2014 Breast MRI Right breast: total extent of disease in upper inner portion including adjacent satellite nodules / adjacent linear non mass enhancement measuring up to 4.8 cm. Close to pectoralis. Lymph nodes with mild irregularity/thickened cortices in the right axilla   11/15/2014 Clinical Stage Stage IIB: T2 N1   12/10/2014 - 02/25/2015 Neo-Adjuvant Chemotherapy Weekly paclitaxel and carboplatin 12   03/04/2015 Breast MRI Interval decrease in size of enhancing mass within the upper inner right breast posterior depth. Adjacent nodules and non mass enhancement are grossly similar when compared to prior exam   04/08/2015 Definitive Surgery Right lumpectomy/[facility]): Multifocal  invasive ductal carcinoma 2 foci 2.5 cm, 0.5 cm with DCIS, margins negative closest 0.1 cm, 1/33 lymph nodes positive    04/08/2015 Pathologic Stage Stage IIB: ypT2(m) ypN1a   05/19/2015 - 07/03/2015 Radiation Therapy Adjuvant RT [facility]): Right breast / 45 Gray @ 1.8 [place] Cables per fraction x 25 fractions; Right SCLV/PAB / 45 Gy at 1.6v Gy per fraction x 25 fractions; Right breast boost / 16 Gray at [employer] per fraction x 8 fractions. Total dose: 61 Gy.   09/18/2015 Survivorship Survivorship visit completed and copy of care plan given to patient.    CHIEF COMPLIANT: surveillance for breast cancer  INTERVAL HISTORY: Brianna Valdez is a 82 year old with above-mentioned history of right breast cancer treated with neoadjuvant chemotherapy followed by right lumpectomy and adjuvant radiation. She is here for routine follow-up and reports that she has some moderate amount of lymphedema in the right breast which she is doing physical therapy. Otherwise she has no new complaints or concerns.  REVIEW OF SYSTEMS:   Constitutional: Denies fevers, chills or abnormal weight loss Eyes: Denies blurriness of vision Ears, nose, mouth, throat, and face: Denies mucositis or sore throat Respiratory: Denies cough, dyspnea or wheezes Cardiovascular: Denies palpitation, chest discomfort Gastrointestinal:  Denies nausea, heartburn or change in bowel habits Skin: Denies abnormal skin rashes Lymphatics: Denies new lymphadenopathy or easy bruising Neurological:Denies numbness, tingling or new weaknesses Behavioral/Psych: Mood is stable, no new changes  Extremities: No lower extremity edema Breast:lymphedema in the right breast All other systems were reviewed with the patient and are negative.  I have reviewed the past medical history, past surgical history, social history and family history with the  patient and they are unchanged from previous note.  ALLERGIES:  has No Known Allergies.  MEDICATIONS:  Current  Outpatient Prescriptions  Medication Sig Dispense Refill  . amLODipine (NORVASC) 2.5 MG tablet Take 2.5 mg by mouth daily.    . brimonidine (ALPHAGAN) 0.15 % ophthalmic solution Place 1 drop into both eyes 2 (two) times daily.    Marland Kitchen glimepiride (AMARYL) 1 MG tablet Take 1 mg by mouth daily with breakfast.   6  . hyaluronate sodium (RADIAPLEXRX) GEL Apply 1 application topically 2 (two) times daily.    Marland Kitchen levothyroxine (SYNTHROID, LEVOTHROID) 50 MCG tablet Take 50 mcg by mouth daily before breakfast.    . lisinopril (PRINIVIL,ZESTRIL) 40 MG tablet Take 40 mg by mouth daily.    . LUTEIN PO Take 25 mg by mouth daily.    . Magnesium 250 MG TABS Take 250 mg by mouth daily with supper.    . metFORMIN (GLUCOPHAGE) 500 MG tablet Take 500-1,000 mg by mouth 2 (two) times daily with a meal. Take 2 tablets (1000 mg) with breakfast and 1 tablet (500 mg) with supper    . Misc Natural Products (OSTEO BI-FLEX JOINT SHIELD PO) Take 1 tablet by mouth daily with supper.     . Multiple Vitamin (MULTIVITAMIN WITH MINERALS) TABS tablet Take by mouth 2 (two) times daily. 1 packet of Daily Advantage Vitamins twice daily (minus the fish oil capsule)    . OVER THE COUNTER MEDICATION Take 1 tablet by mouth daily with breakfast. Blood Builder    . OVER THE COUNTER MEDICATION Take 1 tablet by mouth daily with breakfast. Neuro Support Plus    . OVER THE COUNTER MEDICATION Take 1 tablet by mouth at bedtime as needed (sleep). Pure Sleep    . OVER THE COUNTER MEDICATION Sun chlorella Takes 4-6 tabs every morning after breakfast    . Probiotic Product (PROBIOTIC DAILY PO) Take 1 tablet by mouth daily with supper. Probiotic Advantage Ultra 20    . UNABLE TO FIND 1 each by Other route as needed. Dispense per medical necessity class 1 compression sleeve 20-42m/Hg and gauntlet for hx of breast cancer s/p surgery 1 each 1   No current facility-administered medications for this visit.    PHYSICAL EXAMINATION: ECOG PERFORMANCE  STATUS: 1 - Symptomatic but completely ambulatory  Filed Vitals:   10/15/15 1135  BP: 126/59  Pulse: 83  Temp: 98.4 F (36.9 C)  Resp: 18   Filed Weights   10/15/15 1135  Weight: 154 lb 6.4 oz (70.035 kg)    GENERAL:alert, no distress and comfortable SKIN: skin color, texture, turgor are normal, no rashes or significant lesions EYES: normal, Conjunctiva are pink and non-injected, sclera clear OROPHARYNX:no exudate, no erythema and lips, buccal mucosa, and tongue normal  NECK: supple, thyroid normal size, non-tender, without nodularity LYMPH:  no palpable lymphadenopathy in the cervical, axillary or inguinal LUNGS: clear to auscultation and percussion with normal breathing effort HEART: regular rate & rhythm and no murmurs and no lower extremity edema ABDOMEN:abdomen soft, non-tender and normal bowel sounds MUSCULOSKELETAL:no cyanosis of digits and no clubbing  NEURO: alert & oriented x 3 with fluent speech, no focal motor/sensory deficits EXTREMITIES: No lower extremity edema BREAST:moderate lymphedema in the right breast. (exam performed in the presence of a chaperone)  LABORATORY DATA:  I have reviewed the data as listed   Chemistry      Component Value Date/Time   NA 143 04/09/2015 0436   NA 141 02/25/2015 1107  K 4.6 04/09/2015 0436   K 5.2* 02/25/2015 1107   CL 108 04/09/2015 0436   CO2 27 04/09/2015 0436   CO2 27 02/25/2015 1107   BUN 12 04/09/2015 0436   BUN 15.5 02/25/2015 1107   CREATININE 0.99 04/09/2015 0436   CREATININE 1.1 02/25/2015 1107      Component Value Date/Time   CALCIUM 8.8* 04/09/2015 0436   CALCIUM 9.2 02/25/2015 1107   ALKPHOS 71 02/25/2015 1107   ALKPHOS 83 06/26/2012 0200   AST 16 02/25/2015 1107   AST 22 06/26/2012 0200   ALT 9 02/25/2015 1107   ALT 13 06/26/2012 0200   BILITOT 0.22 02/25/2015 1107   BILITOT 0.2* 06/26/2012 0200       Lab Results  Component Value Date   WBC 4.7 04/09/2015   HGB 10.5* 04/09/2015   HCT 33.6*  04/09/2015   MCV 95.2 04/09/2015   PLT 281 04/09/2015   NEUTROABS 1.5 02/25/2015     ASSESSMENT & PLAN:  Breast cancer of upper-inner quadrant of right female breast 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 ER 0%, PR 0%, HER-2 negative Ki-67 39%, 2.6 cm mass Right axillary lymph node biopsy positive for invasive ductal carcinoma ER 0% PR 0% HER-2 negative Ki-67 43% Clinical stage: T2 N1 M0 stage IIB Treatment summary: Neoadjuvant chemotherapy with weekly Taxol and carboplatin 12 started 12/10/2014 completed 02/25/2015 Right lumpectomy 04/08/2015 : Multifocal invasive ductal carcinoma 2 foci 2.5 cm, 0.5 cm with DCIS, margins negative closes 0.1 cm, 1/33 lymph nodes positive T2 N1 a M0 stage IIB pathologic stage ------------------------------------------------------------------------------------------------------------------------------------------------------------ Pathology counseling: I discussed the final pathology report and provided her with a copy of this result. Final pathologic stage is T2 N1 stage IIB. Adjuvant XRT from 05/19/15 to 07/03/15  Treatment plan: Surveillance 1. Breast exam shows markedly lymphedema: I will recommend physical therapy referral again. 2. Mammograms will need to be done once a year  I encouraged her to do physical activity and exercise. She will try to do 5-10 minutes of exercise on her stationary bike. She volunteers once a week at the [facility] which has been very helpful to her as well emotionally.  Return to clinic in 6 months for follow-up   No orders of the defined types were placed in this encounter.   The patient has a good understanding of the overall plan. she agrees with it. she will call with any problems that may develop before the next visit here.   Rulon Eisenmenger, MD 10/15/2015

## 2015-10-21 ENCOUNTER — Ambulatory Visit: Payer: Medicare Other | Attending: Hematology and Oncology | Admitting: Physical Therapy

## 2015-10-21 DIAGNOSIS — I890 Lymphedema, not elsewhere classified: Secondary | ICD-10-CM

## 2015-10-21 DIAGNOSIS — L905 Scar conditions and fibrosis of skin: Secondary | ICD-10-CM | POA: Diagnosis not present

## 2015-10-21 NOTE — Therapy (Signed)
[place], [place], [address] Phone: [phone]   Fax:  [phone]  Physical Therapy Evaluation  Patient Details  Name: Brianna Valdez MRN: [id number] Date of Birth: [birth year] Referring Provider: Dr Lindi Adie   Encounter Date: 10/21/2015      PT End of Session - 10/21/15 1607    Visit Number 1   Number of Visits 9   Date for PT Re-Evaluation 11/21/15   PT Start Time 1515   PT Stop Time 1600   PT Time Calculation (min) 45 min   Activity Tolerance Patient tolerated treatment well   Behavior During Therapy [facility] for tasks assessed/performed      Past Medical History  Diagnosis Date  . Hypertension   . Breast cancer of upper-inner quadrant of left female breast ([place]) 11/06/2014  . Anemia   . Thyroid disease   . GERD (gastroesophageal reflux disease)   . Diabetic neuropathy (HCC)     slight in her feet  . Arthritis   . Glaucoma   . Diabetes mellitus     type 2     Past Surgical History  Procedure Laterality Date  . Abdominal hysterectomy    . Thyroidectomy    . Eye surgery Bilateral     cataract surgery with lens implant  . Colonoscopy    . Portacath placement Left 11/26/2014    Procedure: INSERTION PORT-A-CATH;  Surgeon: Excell Seltzer, MD;  Location: [place];  Service: General;  Laterality: Left;  . Portacath placement Left 11/26/2014    Procedure: REPOSITION OF A PORT-A-CATH;  Surgeon: Excell Seltzer, MD;  Location: [place];  Service: General;  Laterality: Left;  . Breast lumpectomy with sentinel lymph node biopsy Right 04/08/2015  . Breast lumpectomy with sentinel lymph node biopsy Right 04/08/2015    Procedure: RIGHT BREAST LUMPECTOMY WITH RIGHT AXILLARY SENTINEL LYMPH NODE BIOPSY;  Surgeon: Excell Seltzer, MD;  Location: [place];  Service: General;  Laterality: Right;  . Port-a-cath removal Left 04/08/2015    Procedure: REMOVAL PORT-A-CATH LEFT CHEST;  Surgeon: Excell Seltzer, MD;   Location: [place];  Service: General;  Laterality: Left;    There were no vitals filed for this visit.  Visit Diagnosis:  Lymphedema of breast - Plan: PT plan of care cert/re-cert  Scar of breast - Plan: PT plan of care cert/re-cert      Subjective Assessment - 10/21/15 1517    Subjective "It just wont' go aways"  She says her breast is just swollen and tight    Pertinent History Brianna Valdez is a 82 year old history of right breast cancer triple negative disease who underwent neoadjuvant chemotherapy with weekly Taxol and carboplatin. She underwent lumpectomy on 04/08/2015.  She had full axillary lymph node dissection which revealed one positive lymph node.   Radiation completed in September .  HTN and diabetes controlled with meds.   Patient Stated Goals to get the swelling gone   Currently in Pain? No/denies            [facility] PT Assessment - 10/21/15 0001    Assessment   Medical Diagnosis Right breast cancer   Referring Provider Dr [place]i Adie    Onset Date/Surgical Date 04/08/15   Precautions   Precautions Other (comment)   Precaution Comments cancer precautions; currently undergoing radiatin therapy   Restrictions   Weight Bearing Restrictions No   Balance Screen   Has the patient fallen in the past 6 months Yes   How many times? twice  Has the patient had a decrease in activity level because of a fear of falling?  Yes  pt doesn't feel she has energy    Is the patient reluctant to leave their home because of a fear of falling?  No   Home Ecologist residence   Living Arrangements Spouse/significant other   Available Help at Discharge Available PRN/intermittently  granddaughter is in PT school at [place] [employer]   Level of [place] Retired  from hospital administration   Cognition   Overall Cognitive Status Within Functional Limits for tasks assessed   Observation/Other Assessments    Observations right breast is large and tissue appears to be firm, scars at medical breast with puffy areas around them    Quick DASH  --   ROM / Strength   AROM / PROM / Strength AROM   AROM   Overall AROM  Within functional limits for tasks performed   Overall AROM Comments did not assess as pt feels it is not a problem    Right Shoulder Flexion --   Right Shoulder ABduction --   Right Shoulder External Rotation --   Left Shoulder Flexion --   Left Shoulder ABduction --   Left Shoulder External Rotation --   Strength   Overall Strength Within functional limits for tasks performed  pt has just completed home health PT and has a home program    Palpation   Palpation comment firm fibrosis in whole right breast with decreased tissue excursion at incisions at medical breast.            LYMPHEDEMA/ONCOLOGY QUESTIONNAIRE - 10/21/15 1610    Right Upper Extremity Lymphedema   Other pt has visible and palpable lymphedema in breast that is not able to be objectively measured            Quick Dash - 10/21/15 0001    Open a tight or new jar Moderate difficulty   Do heavy household chores (wash walls, wash floors) No difficulty   Carry a shopping bag or briefcase No difficulty   Wash your back No difficulty   Use a knife to cut food Mild difficulty   Recreational activities in which you take some force or impact through your arm, shoulder, or hand (golf, hammering, tennis) Mild difficulty   During the past week, to what extent has your arm, shoulder or hand problem interfered with your normal social activities with family, friends, neighbors, or groups? Not at all   During the past week, to what extent has your arm, shoulder or hand problem limited your work or other regular daily activities Not at all   Arm, shoulder, or hand pain. None   Tingling (pins and needles) in your arm, shoulder, or hand None   Difficulty Sleeping No difficulty   DASH Score 9.09 %             [facility]  Adult PT Treatment/Exercise - 10/21/15 0001    Self-Care   Self-Care Other Self-Care Comments   Other Self-Care Comments  provided chip pack for pt to wear under her breast inside her bra    Manual Therapy   Manual Lymphatic Drainage (MLD) In supine, short neck, left axilla and right groin, anterior interaxillary anastomosis and right axillo-inguinal anastomosis, and right breast, directing toward pathways; in left sidelying, right lateral breast and posterior interaxillary anastomosis right to left, and right axillo-inguinal anastomosis  Long Term Clinic Goals - November 18, 2015 1609    CC Long Term Goal  #1   Title patient will report that lymphedema in breast has decreased by 50%    Time 4   Period Weeks   Status New   CC Long Term Goal  #2   Title Pt will report she is able to manage her lymphedema on her own   Time [place] - 11-18-2015 1613    Clinical Impression Statement Brianna Valdez returns to PT as she continues to have problems with lymphedema and fibrosis in right breast despite use of compression bra and self manual lymph drainage.  She wants to PT for treatment of lymphedema to get it reduced and then she will be able to manage that on her own at home    Pt will benefit from skilled therapeutic intervention in order to improve on the following deficits Increased edema;Increased fascial restricitons;Decreased scar mobility   Rehab Potential Excellent   Clinical Impairments Affecting Rehab Potential prvious radiation   PT Frequency 2x / week   PT Duration 4 weeks   PT Treatment/Interventions ADLs/Self Care Home Management;Therapeutic exercise;Manual techniques;Taping;Patient/family education;Scar mobilization;Manual lymph drainage   PT Next Visit Plan assess use of chip pack. assess pt compression bra if she brings it isn. continue with manual lymph drianage and scar moblizaion, consider trial of  kinesiotape   Consulted and Agree with Plan of Care Patient          G-Codes - 11-18-2015 1611    Functional Assessment Tool Used clinical judgement    Functional Limitation Self care   Carrying, Moving and Handling Objects Current Status [id number]) At least 60 percent but less than 80 percent impaired, limited or restricted   Carrying, Moving and Handling Objects Goal Status [id number]) At least 20 percent but less than 40 percent impaired, limited or restricted       Problem List Patient Active Problem List   Diagnosis Date Noted  . Breast cancer ([place]) 04/08/2015  . Breast cancer of upper-inner quadrant of right female breast ([place]) 11/06/2014   Donato Heinz. Owens Shark, PT  11/18/2015, 5:10 PM  [place], [place], [address] Phone: [phone]   Fax:  [phone]  Name: Brianna Valdez MRN: [id number] Date of Birth: 1931-04-02

## 2015-10-22 ENCOUNTER — Ambulatory Visit: Payer: Medicare Other | Admitting: Physical Therapy

## 2015-10-22 DIAGNOSIS — I890 Lymphedema, not elsewhere classified: Secondary | ICD-10-CM

## 2015-10-22 DIAGNOSIS — L905 Scar conditions and fibrosis of skin: Secondary | ICD-10-CM

## 2015-10-22 NOTE — Therapy (Signed)
[place], [place], [address] Phone: [phone]   Fax:  [phone]  Physical Therapy Treatment  Patient Details  Name: Brianna Valdez MRN: [id number] Date of Birth: 1931-05-29 Referring Provider: Dr Lindi Adie   Encounter Date: 10/22/2015      PT End of Session - 10/22/15 1153    Visit Number 2   Number of Visits 9   Date for PT Re-Evaluation 11/21/15   PT Start Time 1100   PT Stop Time 1149   PT Time Calculation (min) 49 min   Activity Tolerance Patient tolerated treatment well   Behavior During Therapy [facility] for tasks assessed/performed      Past Medical History  Diagnosis Date  . Hypertension   . Breast cancer of upper-inner quadrant of left female breast ([place]) 11/06/2014  . Anemia   . Thyroid disease   . GERD (gastroesophageal reflux disease)   . Diabetic neuropathy (HCC)     slight in her feet  . Arthritis   . Glaucoma   . Diabetes mellitus     type 2     Past Surgical History  Procedure Laterality Date  . Abdominal hysterectomy    . Thyroidectomy    . Eye surgery Bilateral     cataract surgery with lens implant  . Colonoscopy    . Portacath placement Left 11/26/2014    Procedure: INSERTION PORT-A-CATH;  Surgeon: Excell Seltzer, MD;  Location: [place];  Service: General;  Laterality: Left;  . Portacath placement Left 11/26/2014    Procedure: REPOSITION OF A PORT-A-CATH;  Surgeon: Excell Seltzer, MD;  Location: [place];  Service: General;  Laterality: Left;  . Breast lumpectomy with sentinel lymph node biopsy Right 04/08/2015  . Breast lumpectomy with sentinel lymph node biopsy Right 04/08/2015    Procedure: RIGHT BREAST LUMPECTOMY WITH RIGHT AXILLARY SENTINEL LYMPH NODE BIOPSY;  Surgeon: Excell Seltzer, MD;  Location: [place];  Service: General;  Laterality: Right;  . Port-a-cath removal Left 04/08/2015    Procedure: REMOVAL PORT-A-CATH LEFT CHEST;  Surgeon: Excell Seltzer, MD;   Location: [place];  Service: General;  Laterality: Left;    There were no vitals filed for this visit.  Visit Diagnosis:  Lymphedema of breast  Scar of breast      Subjective Assessment - 10/22/15 1106    Subjective "I see a difference already."  It feels less swollen, a little smaller.  Therapist from yesterday also observes improvement and can feel that tissue is softer.   Currently in Pain? No/denies               LYMPHEDEMA/ONCOLOGY QUESTIONNAIRE - 10/21/15 1610    Right Upper Extremity Lymphedema   Other pt has visible and palpable lymphedema in breast that is not able to be objectively measured                   [facility] Adult PT Treatment/Exercise - 10/22/15 0001    Self-Care   Other Self-Care Comments  gave list of compression bra brands and where to obtain these   Manual Therapy   Manual Therapy Soft tissue mobilization   Soft tissue mobilization at medial aspect of right breast where there is an area of skin fold and induration   Manual Lymphatic Drainage (MLD) In supine, short neck, superficial and deep abdomen, left axilla and anterior interaxillary anastomosis, right groin and axillo-inguinal anastomosis, and right breast, directing towards pathways; in left sidelying, posterior interaxillary anastomosis and right axillo-inguinal anastomosis,  and right lateral breast.                PT Education - 10/22/15 1153    Education provided Yes   Education Details about compression bras   Person(s) Educated Patient   Methods Explanation;Handout   Comprehension Verbalized understanding                [place] Goals - Oct 27, 2015 1609    CC Long Term Goal  #1   Title patient will report that lymphedema in breast has decreased by 50%    Time 4   Period Weeks   Status New   CC Long Term Goal  #2   Title Pt will report she is able to manage her lymphedema on her own   Time 4   Period Weeks   Status New            Plan -  10/22/15 1154    Clinical Impression Statement Patient is very pleased at already having made some improvement with decreased swelling in right breast just since starting yesterday.  She did well with treatment today and asked about compression bras.   Pt will benefit from skilled therapeutic intervention in order to improve on the following deficits Increased edema;Increased fascial restricitons;Decreased scar mobility   Rehab Potential Excellent   Clinical Impairments Affecting Rehab Potential previous radiation   PT Frequency 2x / week   PT Duration 4 weeks   PT Treatment/Interventions Manual lymph drainage;Manual techniques;ADLs/Self Care Home Management;Patient/family education   PT Next Visit Plan Assess compression bra if patient brings it in; continue manual lymph drainage and soft tissue mobilization; consider Kinesiotape   Consulted and Agree with Plan of Care Patient          G-Codes - 2015/10/27 1611    Functional Assessment Tool Used clinical judgement    Functional Limitation Self care   Carrying, Moving and Handling Objects Current Status [id number]) At least 60 percent but less than 80 percent impaired, limited or restricted   Carrying, Moving and Handling Objects Goal Status [id number]) At least 20 percent but less than 40 percent impaired, limited or restricted      Problem List Patient Active Problem List   Diagnosis Date Noted  . Breast cancer ([place]) 04/08/2015  . Breast cancer of upper-inner quadrant of right female breast ([place]) 11/06/2014    SALIS[place],DONNA 10/22/2015, 11:58 AM  [place], [place], [address] Phone: [phone]   Fax:  [phone]  Name: Barnetta Bevier MRN: [id number] Date of Birth: [birth year]    Serafina Royals, PT 10/22/2015 11:58 AM

## 2015-10-24 DIAGNOSIS — C50911 Malignant neoplasm of unspecified site of right female breast: Secondary | ICD-10-CM | POA: Diagnosis not present

## 2015-10-28 ENCOUNTER — Ambulatory Visit: Payer: Medicare Other | Admitting: Physical Therapy

## 2015-10-28 DIAGNOSIS — L905 Scar conditions and fibrosis of skin: Secondary | ICD-10-CM

## 2015-10-28 DIAGNOSIS — I890 Lymphedema, not elsewhere classified: Secondary | ICD-10-CM | POA: Diagnosis not present

## 2015-10-28 NOTE — Therapy (Signed)
[place], [place], [address] Phone: [phone]   Fax:  [phone]  Physical Therapy Treatment  Patient Details  Name: Brianna Valdez MRN: [id number] Date of Birth: 1931-05-21 Referring Provider: Dr Lindi Adie   Encounter Date: 10/28/2015      PT End of Session - 10/28/15 1658    Visit Number 3   Number of Visits 9   Date for PT Re-Evaluation 11/21/15   PT Start Time 1603   PT Stop Time 1645   PT Time Calculation (min) 42 min   Activity Tolerance Patient tolerated treatment well      Past Medical History  Diagnosis Date  . Hypertension   . Breast cancer of upper-inner quadrant of left female breast ([place]) 11/06/2014  . Anemia   . Thyroid disease   . GERD (gastroesophageal reflux disease)   . Diabetic neuropathy (HCC)     slight in her feet  . Arthritis   . Glaucoma   . Diabetes mellitus     type 2     Past Surgical History  Procedure Laterality Date  . Abdominal hysterectomy    . Thyroidectomy    . Eye surgery Bilateral     cataract surgery with lens implant  . Colonoscopy    . Portacath placement Left 11/26/2014    Procedure: INSERTION PORT-A-CATH;  Surgeon: Excell Seltzer, MD;  Location: [facility];  Service: General;  Laterality: Left;  . Portacath placement Left 11/26/2014    Procedure: REPOSITION OF A PORT-A-CATH;  Surgeon: Excell Seltzer, MD;  Location: [facility];  Service: General;  Laterality: Left;  . Breast lumpectomy with sentinel lymph node biopsy Right 04/08/2015  . Breast lumpectomy with sentinel lymph node biopsy Right 04/08/2015    Procedure: RIGHT BREAST LUMPECTOMY WITH RIGHT AXILLARY SENTINEL LYMPH NODE BIOPSY;  Surgeon: Excell Seltzer, MD;  Location: [place];  Service: General;  Laterality: Right;  . Port-a-cath removal Left 04/08/2015    Procedure: REMOVAL PORT-A-CATH LEFT CHEST;  Surgeon: Excell Seltzer, MD;  Location: [facility];  Service: General;  Laterality: Left;     There were no vitals filed for this visit.  Visit Diagnosis:  Lymphedema of breast  Scar of breast      Subjective Assessment - 10/28/15 1655    Subjective "It comes and goes"  Pt feels that her breast is full today    Currently in Pain? No/denies                         [facility] Adult PT Treatment/Exercise - 10/28/15 0001    Manual Therapy   Manual Lymphatic Drainage (MLD) In supine, short neck, superficial and deep abdomen, left axilla and anterior interaxillary anastomosis, right groin and axillo-inguinal anastomosis, and right breast, directing towards pathways; in left sidelying, posterior interaxillary anastomosis and right axillo-inguinal anastomosis, and right lateral breast.   Kinesiotix   Edema skinkote applied, then kinesiotape in fan shape from medical and lateral breast toward right inguinal nodes                 PT Education - 10/28/15 1657    Education provided Yes   Education Details remove kinesiotape by rolling at any sign of irritation    Person(s) Educated Patient   Methods Explanation   Comprehension Verbalized understanding                [facility] Goals - 10/21/15 [place]  Goal  #1   Title patient will report that lymphedema in breast has decreased by 50%    Time 4   Period Weeks   Status New   CC Long Term Goal  #2   Title Pt will report she is able to manage her lymphedema on her own   Time 4   Period Weeks   Status New            Plan - 10/28/15 1658    Clinical Impression Statement Edema and firmness noted in breast that seemed to soften slightly during manual lymph drainage.  Applies Ktape to see if it will help with firmness.  Pt continues to use chip pack    Pt will benefit from skilled therapeutic intervention in order to improve on the following deficits Increased edema;Increased fascial restricitons;Decreased scar mobility   PT Next Visit Plan Assess effect of Ktape and  compression bra if patient brings it in; continue manual lymph drainage and soft tissue mobilization;    Consulted and Agree with Plan of Care Patient        Problem List Patient Active Problem List   Diagnosis Date Noted  . Breast cancer ([place]) 04/08/2015  . Breast cancer of upper-inner quadrant of right female breast ([place]) 11/06/2014   Donato Heinz. Owens Shark, PT  10/28/2015, 5:00 PM  [place], [place], [address] Phone: [phone]   Fax:  [phone]  Name: Brianna Valdez MRN: [id number] Date of Birth: 28-Nov-1930

## 2015-10-30 ENCOUNTER — Ambulatory Visit: Payer: Medicare Other | Admitting: Physical Therapy

## 2015-11-04 ENCOUNTER — Ambulatory Visit: Payer: Medicare Other | Admitting: Physical Therapy

## 2015-11-04 DIAGNOSIS — L905 Scar conditions and fibrosis of skin: Secondary | ICD-10-CM

## 2015-11-04 DIAGNOSIS — I890 Lymphedema, not elsewhere classified: Secondary | ICD-10-CM | POA: Diagnosis not present

## 2015-11-04 NOTE — Therapy (Signed)
[place], [place], [address] Phone: [phone]   Fax:  [phone]  Physical Therapy Treatment  Patient Details  Name: Brianna Valdez MRN: [id number] Date of Birth: Dec 13, 1930 Referring Provider: Dr Lindi Adie   Encounter Date: 11/04/2015      PT End of Session - 11/04/15 1723    Visit Number 4   Number of Visits 9   Date for PT Re-Evaluation 11/21/15   PT Start Time 1515   PT Stop Time 1600   PT Time Calculation (min) 45 min   Activity Tolerance Patient tolerated treatment well   Behavior During Therapy [facility] for tasks assessed/performed      Past Medical History  Diagnosis Date  . Hypertension   . Breast cancer of upper-inner quadrant of left female breast ([place]) 11/06/2014  . Anemia   . Thyroid disease   . GERD (gastroesophageal reflux disease)   . Diabetic neuropathy (HCC)     slight in her feet  . Arthritis   . Glaucoma   . Diabetes mellitus     type 2     Past Surgical History  Procedure Laterality Date  . Abdominal hysterectomy    . Thyroidectomy    . Eye surgery Bilateral     cataract surgery with lens implant  . Colonoscopy    . Portacath placement Left 11/26/2014    Procedure: INSERTION PORT-A-CATH;  Surgeon: Excell Seltzer, MD;  Location: [place];  Service: General;  Laterality: Left;  . Portacath placement Left 11/26/2014    Procedure: REPOSITION OF A PORT-A-CATH;  Surgeon: Excell Seltzer, MD;  Location: [place];  Service: General;  Laterality: Left;  . Breast lumpectomy with sentinel lymph node biopsy Right 04/08/2015  . Breast lumpectomy with sentinel lymph node biopsy Right 04/08/2015    Procedure: RIGHT BREAST LUMPECTOMY WITH RIGHT AXILLARY SENTINEL LYMPH NODE BIOPSY;  Surgeon: Excell Seltzer, MD;  Location: [place];  Service: General;  Laterality: Right;  . Port-a-cath removal Left 04/08/2015    Procedure: REMOVAL PORT-A-CATH LEFT CHEST;  Surgeon: Excell Seltzer, MD;   Location: [place];  Service: General;  Laterality: Left;    There were no vitals filed for this visit.  Visit Diagnosis:  Lymphedema of breast  Scar of breast      Subjective Assessment - 11/04/15 1719    Subjective Pt comes in today with Kinesiotape still intact, but wearing [place] bra and  breast pad She is very happy to see that she has had reduction in her breast lymphedema   Pertinent History Brianna Valdez is a 82 year old history of right breast cancer triple negative disease who underwent neoadjuvant chemotherapy with weekly Taxol and carboplatin. She underwent lumpectomy on 04/08/2015.  She had full axillary lymph node dissection which revealed one positive lymph node.   Radiation completed in September .  HTN and diabetes controlled with meds.   Patient Stated Goals to get the swelling gone   Currently in Pain? No/denies                         [facility] Adult PT Treatment/Exercise - 11/04/15 0001    Manual Therapy   Manual Lymphatic Drainage (MLD) In supine, short neck, superficial and deep abdomen, left axilla and anterior interaxillary anastomosis, right groin and axillo-inguinal anastomosis, and right breast, directing towards pathways; in left sidelying, posterior interaxillary anastomosis and right axillo-inguinal anastomosis, and right lateral breast.  PT Education - 11/04/15 1722    Education provided Yes   Education Details continue to do diphragmatic breathing and dowel rod exercise and wear compression bra and pad daily    Person(s) Educated Patient   Methods Explanation   Comprehension Verbalized understanding                [facility] Goals - 11/04/15 1722    CC Long Term Goal  #1   Title patient will report that lymphedema in breast has decreased by 50%    Status Achieved   CC Long Term Goal  #2   Title Pt will report she is able to manage her lymphedema on her own   Status On-going             Plan - 11/04/15 1723    Clinical Impression Statement Pt has had reduction in lymphedema in breast.  Tissue is much softer with only minimal firmness around scar.  She has responeded well to treatment and has met one goal. She will come back next week to see if she is able to maintain reduction at home .  If so will discharge    Pt will benefit from skilled therapeutic intervention in order to improve on the following deficits Increased edema;Increased fascial restricitons;Decreased scar mobility   Rehab Potential Excellent   Clinical Impairments Affecting Rehab Potential previous radiation   PT Next Visit Plan Assess.  If tissue remains soft and patient is able to control with compression bra, Discharge, if not consider teaching pt how to use kinesiotape    Consulted and Agree with Plan of Care Patient        Problem List Patient Active Problem List   Diagnosis Date Noted  . Breast cancer ([place]) 04/08/2015  . Breast cancer of upper-inner quadrant of right female breast ([place]) 11/06/2014   Donato Heinz. Owens Shark, PT    11/04/2015, 5:26 PM  [place], [place], [address] Phone: [phone]   Fax:  [phone]  Name: Brianna Valdez MRN: [id number] Date of Birth: 12-19-1930

## 2015-11-06 ENCOUNTER — Encounter: Payer: Medicare Other | Admitting: Physical Therapy

## 2015-11-11 ENCOUNTER — Ambulatory Visit: Payer: Medicare Other | Attending: Hematology and Oncology | Admitting: Physical Therapy

## 2015-11-11 DIAGNOSIS — I890 Lymphedema, not elsewhere classified: Secondary | ICD-10-CM | POA: Diagnosis not present

## 2015-11-11 DIAGNOSIS — L905 Scar conditions and fibrosis of skin: Secondary | ICD-10-CM | POA: Diagnosis not present

## 2015-11-11 NOTE — Therapy (Signed)
[place], [place], [address] Phone: [phone]   Fax:  [phone]  Physical Therapy Treatment  Patient Details  Name: Brianna Valdez MRN: [id number] Date of Birth: 1931/05/08 Referring Provider: Dr Lindi Adie   Encounter Date: 11/11/2015      PT End of Session - 11/11/15 1710    Visit Number 5   Number of Visits 5   PT Start Time 1610   PT Stop Time 1648   PT Time Calculation (min) 38 min   Activity Tolerance Patient tolerated treatment well   Behavior During Therapy [facility] for tasks assessed/performed      Past Medical History  Diagnosis Date  . Hypertension   . Breast cancer of upper-inner quadrant of left female breast ([place]) 11/06/2014  . Anemia   . Thyroid disease   . GERD (gastroesophageal reflux disease)   . Diabetic neuropathy (HCC)     slight in her feet  . Arthritis   . Glaucoma   . Diabetes mellitus     type 2     Past Surgical History  Procedure Laterality Date  . Abdominal hysterectomy    . Thyroidectomy    . Eye surgery Bilateral     cataract surgery with lens implant  . Colonoscopy    . Portacath placement Left 11/26/2014    Procedure: INSERTION PORT-A-CATH;  Surgeon: Excell Seltzer, MD;  Location: [place];  Service: General;  Laterality: Left;  . Portacath placement Left 11/26/2014    Procedure: REPOSITION OF A PORT-A-CATH;  Surgeon: Excell Seltzer, MD;  Location: [place];  Service: General;  Laterality: Left;  . Breast lumpectomy with sentinel lymph node biopsy Right 04/08/2015  . Breast lumpectomy with sentinel lymph node biopsy Right 04/08/2015    Procedure: RIGHT BREAST LUMPECTOMY WITH RIGHT AXILLARY SENTINEL LYMPH NODE BIOPSY;  Surgeon: Excell Seltzer, MD;  Location: [place];  Service: General;  Laterality: Right;  . Port-a-cath removal Left 04/08/2015    Procedure: REMOVAL PORT-A-CATH LEFT CHEST;  Surgeon: Excell Seltzer, MD;  Location: [place];  Service: General;   Laterality: Left;    There were no vitals filed for this visit.  Visit Diagnosis:  Lymphedema of breast  Scar of breast      Subjective Assessment - 11/11/15 1618    Subjective Feels she is able to continue on her own.   Currently in Pain? No/denies  sometimes the breast is a little sore            [facility] PT Assessment - 11/11/15 0001    Observation/Other Assessments   Observations by observation, patient's right breast now appears to be approximately the same size as the left, though still somewhat firmer in appearance; when she removed her bra, the indentations from the Jovi Pak compression garment were easily seen, indicating that that is doing what it is supposed to do   Palpation   Palpation comment Still with induration mainly at right breast medial aspect where there is an incision                     [facility] Adult PT Treatment/Exercise - 11/11/15 0001    Self-Care   Other Self-Care Comments  discussed that she will need to replace her compression sleeve, typically every six months; suggested that she might get the silicone band at the top since her current sleeve doesn't have that and it is rolled down a bit today   Manual Therapy   Soft tissue mobilization  at right medial breast scar area   Manual Lymphatic Drainage (MLD) In supine, short neck, superficial and deep abdomen, left axilla and anterior interaxillary anastomosis, right groin and axillo-inguinal anastomosis, and right breast, directing towards pathways; in left sidelying, posterior interaxillary anastomosis and right axillo-inguinal anastomosis, and right lateral breast.                PT Education - Dec 04, 2015 1710    Education provided Yes   Education Details continue daily exercise and manual lymph drainage   Person(s) Educated Patient   Methods Explanation   Comprehension Verbalized understanding                [facility] Goals - 12/04/2015 1717    CC Long Term Goal   #1   Title patient will report that lymphedema in breast has decreased by 50%    Status Achieved   CC Long Term Goal  #2   Title Pt will report she is able to manage her lymphedema on her own   Status Achieved   CC Long Term Goal  #3   Title report decrease in right upper arm discomfort by at least 50%            Plan - 2015-12-04 1711    Clinical Impression Statement Patient is satisfied that she is doing better now, with the right breast approximately the same size as the left.  She is wearing her Jovi Pak compression pad and feels she is ready for discharge.   Pt will benefit from skilled therapeutic intervention in order to improve on the following deficits Increased edema;Increased fascial restricitons;Decreased scar mobility   Rehab Potential Excellent   Clinical Impairments Affecting Rehab Potential previous radiation   PT Next Visit Plan None.  Discharge this visit.   PT Home Exercise Plan continue daily exercise and manual lymph drainage   Consulted and Agree with Plan of Care Patient          G-Codes - 12/04/15 1717    Functional Assessment Tool Used clinical judgement    Functional Limitation --   Carrying, Moving and Handling Objects Goal Status (D6644) At least 20 percent but less than 40 percent impaired, limited or restricted   Carrying, Moving and Handling Objects Discharge Status [phone]) At least 1 percent but less than 20 percent impaired, limited or restricted      Problem List Patient Active Problem List   Diagnosis Date Noted  . Breast cancer ([place]) 04/08/2015  . Breast cancer of upper-inner quadrant of right female breast ([place]) 11/06/2014    SALIS[place],DONNA December 04, 2015, 5:22 PM  [place], [place], [address] Phone: [phone]   Fax:  [phone]  Name: Brianna Valdez MRN: [id number] Date of Birth: Jul 20, 1931    PHYSICAL THERAPY DISCHARGE SUMMARY  Visits from  Start of Care: 5  Current functional level related to goals / functional outcomes: Goals met as noted above.   Remaining deficits: Right breast swelling will continue to require management. Patient has not yet obtained her compression bra.   Education / Equipment: Self-care for right breast lymphedema, Jovi Pak compression pad for right breast Plan: Patient agrees to discharge.  Patient goals were met. Patient is being discharged due to meeting the stated rehab goals.  ?????    Serafina Royals, PT 2015-12-04 5:23 PM

## 2015-11-13 ENCOUNTER — Ambulatory Visit: Payer: Medicare Other | Admitting: Physical Therapy

## 2015-11-18 DIAGNOSIS — H401131 Primary open-angle glaucoma, bilateral, mild stage: Secondary | ICD-10-CM | POA: Diagnosis not present

## 2015-11-18 DIAGNOSIS — H353131 Nonexudative age-related macular degeneration, bilateral, early dry stage: Secondary | ICD-10-CM | POA: Diagnosis not present

## 2016-01-01 DIAGNOSIS — E782 Mixed hyperlipidemia: Secondary | ICD-10-CM | POA: Diagnosis not present

## 2016-01-01 DIAGNOSIS — Z7984 Long term (current) use of oral hypoglycemic drugs: Secondary | ICD-10-CM | POA: Diagnosis not present

## 2016-01-01 DIAGNOSIS — M810 Age-related osteoporosis without current pathological fracture: Secondary | ICD-10-CM | POA: Diagnosis not present

## 2016-01-01 DIAGNOSIS — E1165 Type 2 diabetes mellitus with hyperglycemia: Secondary | ICD-10-CM | POA: Diagnosis not present

## 2016-01-01 DIAGNOSIS — Z0000 Encounter for general adult medical examination without abnormal findings: Secondary | ICD-10-CM | POA: Diagnosis not present

## 2016-01-01 DIAGNOSIS — E039 Hypothyroidism, unspecified: Secondary | ICD-10-CM | POA: Diagnosis not present

## 2016-01-01 DIAGNOSIS — R899 Unspecified abnormal finding in specimens from other organs, systems and tissues: Secondary | ICD-10-CM | POA: Diagnosis not present

## 2016-01-01 DIAGNOSIS — Z853 Personal history of malignant neoplasm of breast: Secondary | ICD-10-CM | POA: Diagnosis not present

## 2016-01-01 DIAGNOSIS — I10 Essential (primary) hypertension: Secondary | ICD-10-CM | POA: Diagnosis not present

## 2016-01-02 ENCOUNTER — Encounter: Payer: Self-pay | Admitting: Adult Health

## 2016-01-02 NOTE — Progress Notes (Signed)
A birthday card was mailed to the patient today on behalf of the Survivorship Program at [facility].   Gretchen Dawson, NP Survivorship Program [facility] [phone]  

## 2016-02-10 DIAGNOSIS — H401131 Primary open-angle glaucoma, bilateral, mild stage: Secondary | ICD-10-CM | POA: Diagnosis not present

## 2016-02-10 DIAGNOSIS — H353131 Nonexudative age-related macular degeneration, bilateral, early dry stage: Secondary | ICD-10-CM | POA: Diagnosis not present

## 2016-02-16 IMAGING — PT NM PET TUM IMG INITIAL (PI) SKULL BASE T - THIGH
7 series · 25 of 25 positions shown · non-contrast
Comparison: 11/15/2014 MRI of the breast

ADDENDUM:
The original report was by Dr. Jadiel Nena. The following
addendum is by Dr. Jadiel Nena:

The first sentence under the chest section should read, "RIGHT upper
inner quadrant breast mass has maximum standard uptake value 4.4."
The first impression should read, "1. RIGHT upper inner quadrant
breast mass is hypermetabolic, compatible
with malignancy."
CLINICAL DATA: Initial treatment strategy for right breast cancer.
EXAM:
NUCLEAR MEDICINE PET SKULL BASE TO THIGH
TECHNIQUE: 8.7 mCi F-18 FDG was injected intravenously. Full-ring PET imaging
was performed from the skull base to thigh after the radiotracer. CT
data was obtained and used for attenuation correction and anatomic
localization.
FASTING BLOOD GLUCOSE:  Value: One hundred sixty-nine mg/dl

[Series 3: pet sk_thigh ac · axial · 5.0mm · 4.07mm/px · z∈[-995,-195]mm · 6 of 201 slices shown]
[im 1/201]
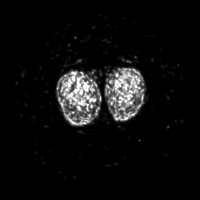
[im 41/201]
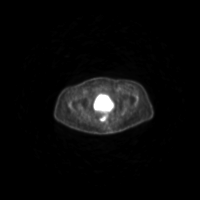
[im 81/201]
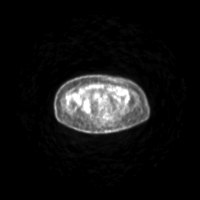
[im 121/201]
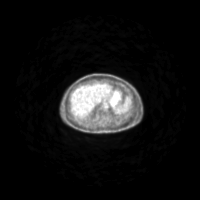
[im 161/201]
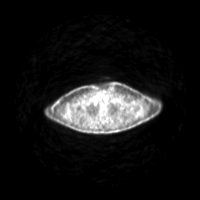
[im 201/201]
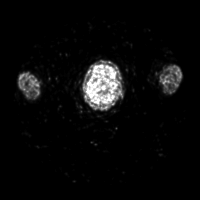

[Series 4: ct sk_thigh 5.0 b31f · axial · 5.0mm · 0.98mm/px · z∈[-995,-195]mm · 5 of 201 slices shown]
[im 1/201]
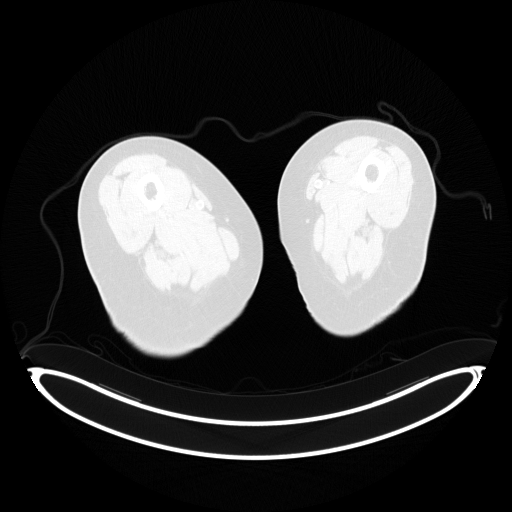
[im 51/201]
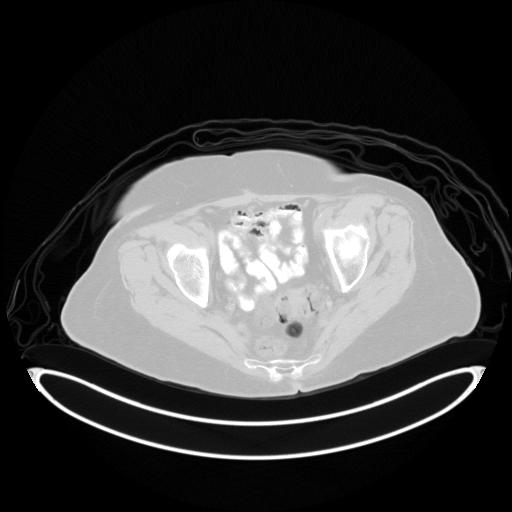
[im 101/201]
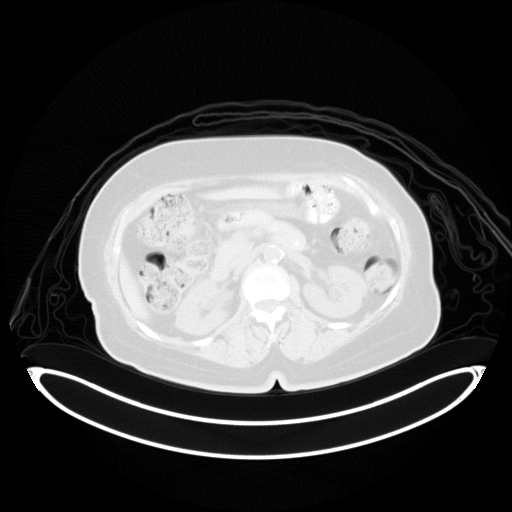
[im 151/201]
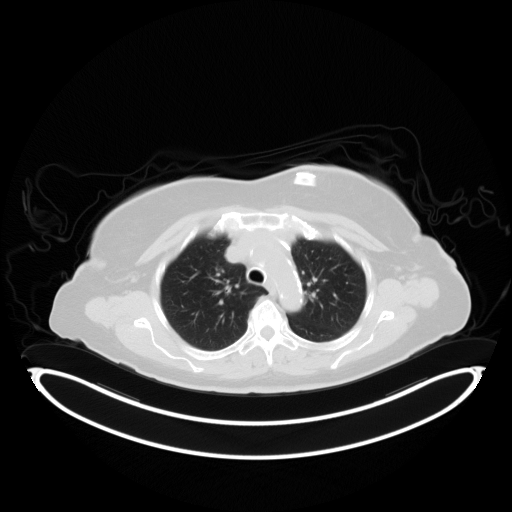
[im 201/201  brain]
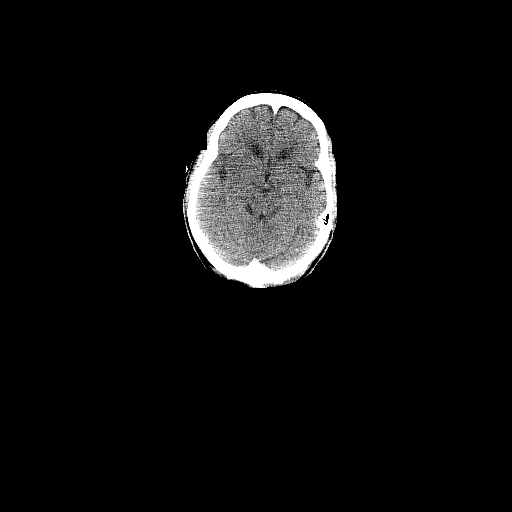

[Series 6: ct sk_thigh 5.0 b70f (id)_bone · axial · 5.0mm · 0.54mm/px · 1 of 52 slices shown]
[im 1/52  bone]
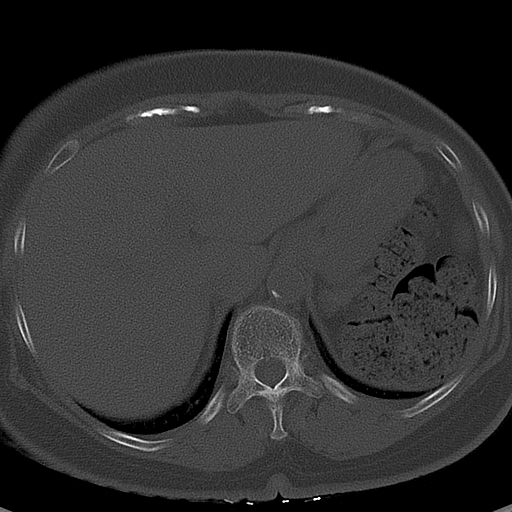

[Series 8: pet sk_thigh nac · axial · 5.0mm · 4.07mm/px · z∈[-995,-195]mm · 5 of 201 slices shown]
[im 1/201]
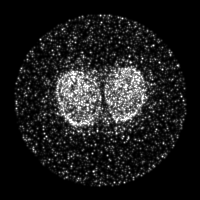
[im 51/201]
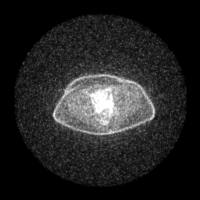
[im 101/201]
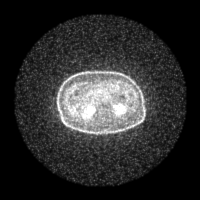
[im 151/201]
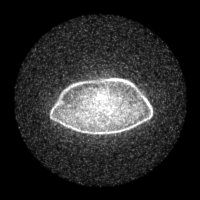
[im 201/201]
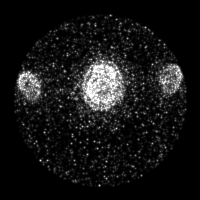

[Series 604: mip collection<mip range> · coronal · 1.68mm/px · 1 of 32 slices shown]
[im 1/32]
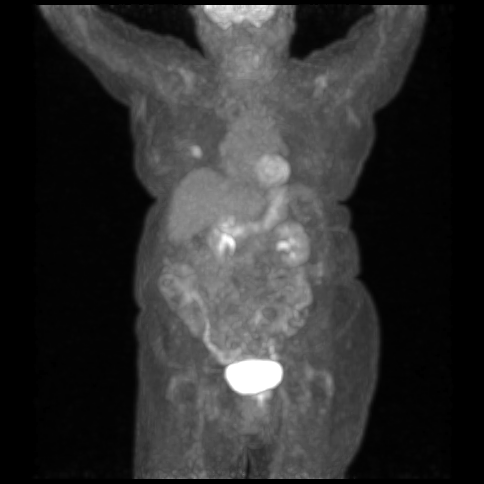

[Series 605: range-ct sk_thigh 5.0 (id)<alpha range> · 2 of 69 slices shown (1 of 2)]
[im 1/69]
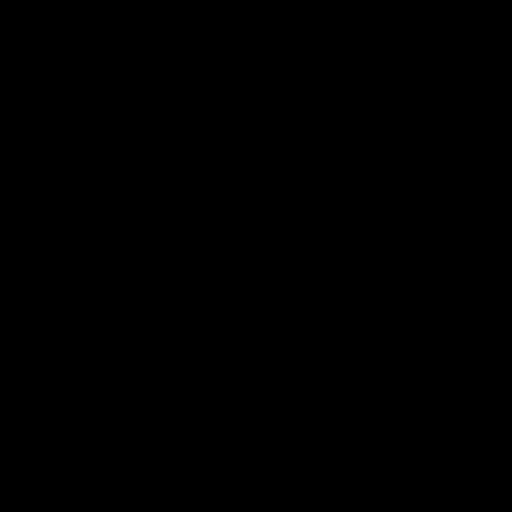
[im 69/69]
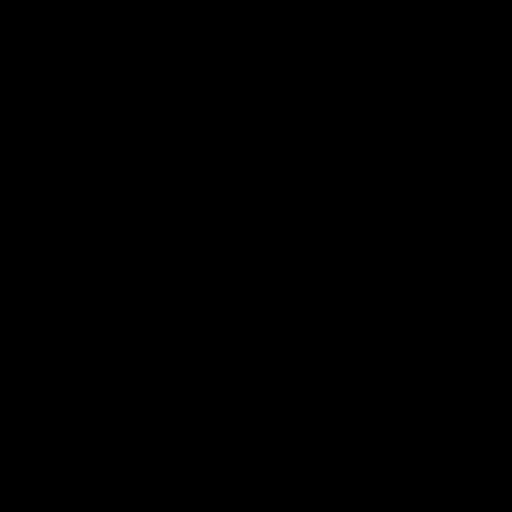

[Series 606: range-ct sk_thigh 5.0 (id)<alpha range> · 5 of 195 slices shown (2 of 2)]
[im 1/195]
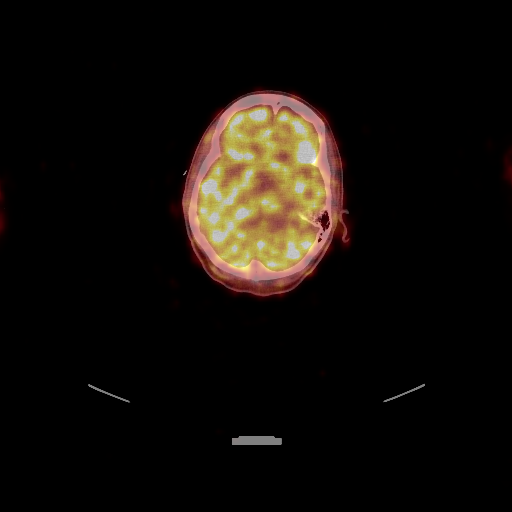
[im 49/195]
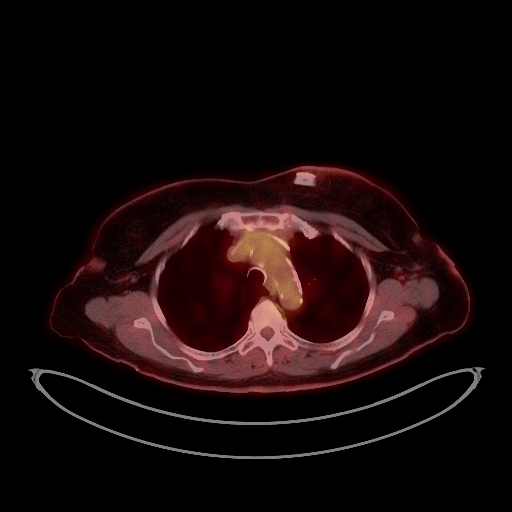
[im 98/195]
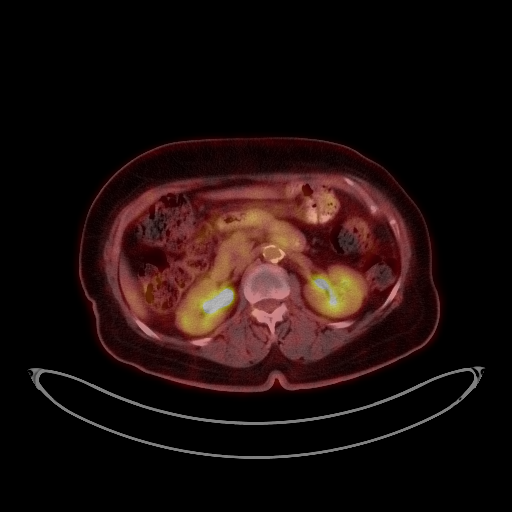
[im 146/195]
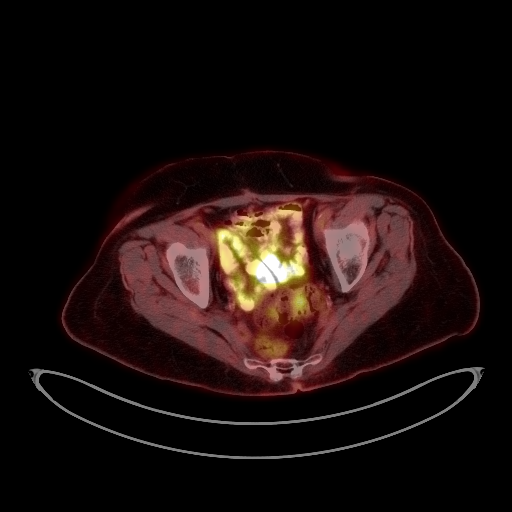
[im 195/195]
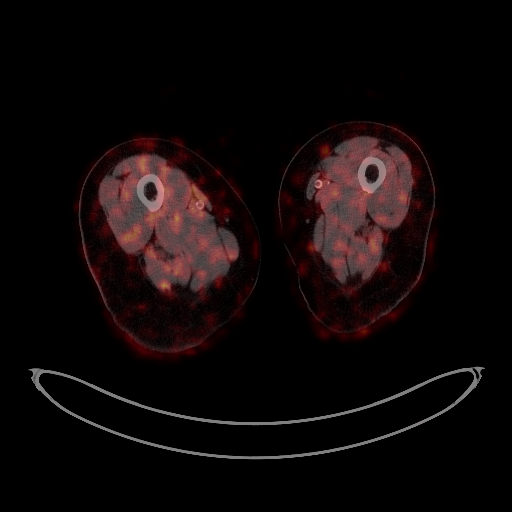

[25 of 25 positions shown; findings below may reference images not displayed]

FINDINGS: NECK

Tonsillar activity in the neck is considered within the benign
range.

CHEST

Left upper inner quadrant breast mass has maximum standard uptake
value 4.4. Several small right axillary lymph nodes are not
pathologically enlarged and half activity not appreciably different
from the contralateral left-sided small axillary lymph nodes,
accordingly not considered worrisome by PET-CT.

In the upper margin of the superior segment left lower lobe, there
is a 0.7 by 0.6 cm nodule on image 15 of series 6 of the CT data,
without appreciable hypermetabolic activity (although no does this
is below reliable PET-CT size thresholds).

Coronary artery and aortic and branch vessel atherosclerotic
calcification.

ABDOMEN/PELVIS

There is high activity throughout much of the stomach, presumably
physiologic, although the stomach is nondistended and assessment of
the gastric wall is problematic. Prominent stool throughout the
colon favors constipation. Aortoiliac atherosclerotic vascular
disease. Physiologic activity in scattered loops of large and small
bowel. There foci of increased activity in the sigmoid colon along
with high activity in the rectum and anus.

SKELETON

Degenerative disc disease and spondylosis at L4-5. There is grade 1
anterolisthesis at L4-5.
IMPRESSION: 1. Left upper inner quadrant breast masses hypermetabolic compatible
with malignancy.
2. 7 mm nodule in the superior segment left lower lobe, not visibly
hypermetabolic but below reliable PET-CT size thresholds. This
likely requires careful observation by imaging.
3. Atherosclerosis.
4.  Prominent stool throughout the colon favors constipation.
5. There is high activity in the rectum and anus, along with multi
focal high activity in the sigmoid colon. If the patient has not had
recent colonoscopy, then colonoscopy would be recommended with
attention to these regions. Specifically, although this could be
physiologic, tumor is not readily excluded.
6. Lower lumbar spondylosis and degenerative disc disease,
particularly at L4-5.

## 2016-02-17 DIAGNOSIS — D649 Anemia, unspecified: Secondary | ICD-10-CM | POA: Diagnosis not present

## 2016-02-17 DIAGNOSIS — Z7984 Long term (current) use of oral hypoglycemic drugs: Secondary | ICD-10-CM | POA: Diagnosis not present

## 2016-02-17 DIAGNOSIS — E119 Type 2 diabetes mellitus without complications: Secondary | ICD-10-CM | POA: Diagnosis not present

## 2016-03-24 ENCOUNTER — Ambulatory Visit
Admission: RE | Admit: 2016-03-24 | Discharge: 2016-03-24 | Disposition: A | Discharge status: Home / Self Care | Payer: Medicare Other | Source: Ambulatory Visit | Attending: Hematology and Oncology | Admitting: Hematology and Oncology

## 2016-03-24 DIAGNOSIS — R928 Other abnormal and inconclusive findings on diagnostic imaging of breast: Secondary | ICD-10-CM | POA: Diagnosis not present

## 2016-03-24 DIAGNOSIS — C50211 Malignant neoplasm of upper-inner quadrant of right female breast: Secondary | ICD-10-CM

## 2016-04-14 DIAGNOSIS — C50911 Malignant neoplasm of unspecified site of right female breast: Secondary | ICD-10-CM | POA: Diagnosis not present

## 2016-04-14 NOTE — Assessment & Plan Note (Signed)
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 ER 0%, PR 0%, HER-2 negative Ki-67 39%, 2.6 cm mass Right axillary lymph node biopsy positive for invasive ductal carcinoma ER 0% PR 0% HER-2 negative Ki-67 43% Clinical stage: T2 N1 M0 stage IIB Treatment summary: Neoadjuvant chemotherapy with weekly Taxol and carboplatin 12 started 12/10/2014 completed 02/25/2015 Right lumpectomy 04/08/2015 : Multifocal invasive ductal carcinoma 2 foci 2.5 cm, 0.5 cm with DCIS, margins negative closes 0.1 cm, 1/33 lymph nodes positive T2 N1 a M0 stage IIB pathologic stage ------------------------------------------------------------------------------------------------------------------------------------------------------------ Pathology counseling: I discussed the final pathology report and provided her with a copy of this result. Final pathologic stage is T2 N1 stage IIB. Adjuvant XRT from 05/19/15 to 07/03/15  Treatment plan: Surveillance 1. Breast exam shows markedly lymphedema: I will recommend physical therapy referral again. 2. Mammograms will need to be done once a year  I encouraged her to do physical activity and exercise. She will try to do 5-10 minutes of exercise on her stationary bike. She volunteers once a week at the [facility] which has been very helpful to her as well emotionally.  Return to clinic in 6 months for follow-up

## 2016-04-15 ENCOUNTER — Ambulatory Visit (HOSPITAL_BASED_OUTPATIENT_CLINIC_OR_DEPARTMENT_OTHER): Payer: Medicare Other | Admitting: Hematology and Oncology

## 2016-04-15 ENCOUNTER — Encounter: Payer: Self-pay | Admitting: Hematology and Oncology

## 2016-04-15 ENCOUNTER — Telehealth: Payer: Self-pay | Admitting: Hematology and Oncology

## 2016-04-15 VITALS — BP 122/55 | HR 82 | Temp 97.8°F | Resp 18 | Ht 62.0 in | Wt 152.4 lb

## 2016-04-15 DIAGNOSIS — C50211 Malignant neoplasm of upper-inner quadrant of right female breast: Secondary | ICD-10-CM

## 2016-04-15 NOTE — Progress Notes (Signed)
Patient Care Team: Hulan Fess, MD as PCP - General Excell Seltzer, MD as Consulting Physician (General Surgery) Nicholas Lose, MD as Consulting Physician (Hematology and Oncology) Thea Silversmith, MD as Consulting Physician (Radiation Oncology) Rockwell Germany, RN as Registered Nurse Mauro Kaufmann, RN as Registered Nurse Sylvan Cheese, NP as Nurse Practitioner (Hematology and Oncology)  DIAGNOSIS: Breast cancer of upper-inner quadrant of right female breast [facility])   Staging form: Breast, AJCC 7th Edition     Clinical stage from 11/20/2014: Stage IIB (T2, N1, M0) - Unsigned     Pathologic: Stage IIB (T2(2), N1a, cM0) - Signed by Nicholas Lose, MD on 04/16/2015   SUMMARY OF ONCOLOGIC HISTORY:   Breast cancer of upper-inner quadrant of right female breast ([place])   10/22/2014 Mammogram Highly suspicious 2.6 cm mass in the upper inner quadrant of the right breast at the 2 o'clock , Pathologic right axillary lymph nodes, including a node in the mid axillary line with eccentric cortical thickening up to 5 mm   11/01/2014 Initial Biopsy Right breast biopsy 2:00: IDC with DCIS, ER/PR 0%, Ki-67 39%, HER-2 negative; right axillary lymph node biopsy: Positive for IDC ER/PR 0%, 43%, HER-2 Negative Ratio 0.94   11/15/2014 Breast MRI Right breast: total extent of disease in upper inner portion including adjacent satellite nodules / adjacent linear non mass enhancement measuring up to 4.8 cm. Close to pectoralis. Lymph nodes with mild irregularity/thickened cortices in the right axilla   11/15/2014 Clinical Stage Stage IIB: T2 N1   12/10/2014 - 02/25/2015 Neo-Adjuvant Chemotherapy Weekly paclitaxel and carboplatin 12   03/04/2015 Breast MRI Interval decrease in size of enhancing mass within the upper inner right breast posterior depth. Adjacent nodules and non mass enhancement are grossly similar when compared to prior exam   04/08/2015 Definitive Surgery Right lumpectomy/[facility]): Multifocal  invasive ductal carcinoma 2 foci 2.5 cm, 0.5 cm with DCIS, margins negative closest 0.1 cm, 1/33 lymph nodes positive    04/08/2015 Pathologic Stage Stage IIB: ypT2(m) ypN1a   05/19/2015 - 07/03/2015 Radiation Therapy Adjuvant RT [facility]): Right breast / 45 Gray @ 1.8 Pearline Cables per fraction x 25 fractions; Right SCLV/PAB / 45 Gy at 1.6v Gy per fraction x 25 fractions; Right breast boost / 16 Gray at [employer] per fraction x 8 fractions. Total dose: 61 Gy.   09/18/2015 Survivorship Survivorship visit completed and copy of care plan given to patient.    CHIEF COMPLIANT: Surveillance of breast cancer  INTERVAL HISTORY: Brianna Valdez is a 82 year old with above-mentioned history right breast cancer treated with lumpectomy followed by adjuvant chemotherapy followed by radiation. She is currently on surveillance because she is ER/PR negative. She reports no new complaints or concerns. She stays very busy. She volunteers at the [employer] and has received high praise is from that.  REVIEW OF SYSTEMS:   Constitutional: Denies fevers, chills or abnormal weight loss Eyes: Denies blurriness of vision Ears, nose, mouth, throat, and face: Denies mucositis or sore throat Respiratory: Denies cough, dyspnea or wheezes Cardiovascular: Denies palpitation, chest discomfort Gastrointestinal:  Denies nausea, heartburn or change in bowel habits Skin: Denies abnormal skin rashes Lymphatics: Denies new lymphadenopathy or easy bruising Neurological:Denies numbness, tingling or new weaknesses Behavioral/Psych: Mood is stable, no new changes  Extremities: No lower extremity edema Breast:  Swelling of the breast from lymphedema All other systems were reviewed with the patient and are negative.  I have reviewed the past medical history, past surgical history, social history and family  history with the patient and they are unchanged from previous note.  ALLERGIES:  has No Known Allergies.  MEDICATIONS:    Current Outpatient Prescriptions  Medication Sig Dispense Refill  . amLODipine (NORVASC) 2.5 MG tablet Take 2.5 mg by mouth daily.    . brimonidine (ALPHAGAN) 0.15 % ophthalmic solution Place 1 drop into both eyes 2 (two) times daily.    Marland Kitchen glimepiride (AMARYL) 1 MG tablet Take 1 mg by mouth daily with breakfast.   6  . hyaluronate sodium (RADIAPLEXRX) GEL Apply 1 application topically 2 (two) times daily.    Marland Kitchen levothyroxine (SYNTHROID, LEVOTHROID) 50 MCG tablet Take 50 mcg by mouth daily before breakfast.    . lisinopril (PRINIVIL,ZESTRIL) 40 MG tablet Take 40 mg by mouth daily.    . LUTEIN PO Take 25 mg by mouth daily.    . Magnesium 250 MG TABS Take 250 mg by mouth daily with supper.    . metFORMIN (GLUCOPHAGE) 500 MG tablet Take 500-1,000 mg by mouth 2 (two) times daily with a meal. Take 2 tablets (1000 mg) with breakfast and 1 tablet (500 mg) with supper    . Misc Natural Products (OSTEO BI-FLEX JOINT SHIELD PO) Take 1 tablet by mouth daily with supper.     . Multiple Vitamin (MULTIVITAMIN WITH MINERALS) TABS tablet Take by mouth 2 (two) times daily. 1 packet of Daily Advantage Vitamins twice daily (minus the fish oil capsule)    . OVER THE COUNTER MEDICATION Take 1 tablet by mouth daily with breakfast. Blood Builder    . OVER THE COUNTER MEDICATION Take 1 tablet by mouth daily with breakfast. Neuro Support Plus    . OVER THE COUNTER MEDICATION Take 1 tablet by mouth at bedtime as needed (sleep). Pure Sleep    . OVER THE COUNTER MEDICATION Sun chlorella Takes 4-6 tabs every morning after breakfast    . Probiotic Product (PROBIOTIC DAILY PO) Take 1 tablet by mouth daily with supper. Probiotic Advantage Ultra 20    . UNABLE TO FIND 1 each by Other route as needed. Dispense per medical necessity class 1 compression sleeve 20-33m/Hg and gauntlet for hx of breast cancer s/p surgery 1 each 1   No current facility-administered medications for this visit.    PHYSICAL EXAMINATION: ECOG  PERFORMANCE STATUS: 1 - Symptomatic but completely ambulatory  Filed Vitals:   04/15/16 1103  BP: 122/55  Pulse: 82  Temp: 97.8 F (36.6 C)  Resp: 18   Filed Weights   04/15/16 1103  Weight: 152 lb 6.4 oz (69.128 kg)    GENERAL:alert, no distress and comfortable SKIN: skin color, texture, turgor are normal, no rashes or significant lesions EYES: normal, Conjunctiva are pink and non-injected, sclera clear OROPHARYNX:no exudate, no erythema and lips, buccal mucosa, and tongue normal  NECK: supple, thyroid normal size, non-tender, without nodularity LYMPH:  no palpable lymphadenopathy in the cervical, axillary or inguinal LUNGS: clear to auscultation and percussion with normal breathing effort HEART: regular rate & rhythm and no murmurs and no lower extremity edema ABDOMEN:abdomen soft, non-tender and normal bowel sounds MUSCULOSKELETAL:no cyanosis of digits and no clubbing  NEURO: alert & oriented x 3 with fluent speech, no focal motor/sensory deficits EXTREMITIES: No lower extremity edema BREAST: Left breast lymphedema (exam performed in the presence of a chaperone)  LABORATORY DATA:  I have reviewed the data as listed   Chemistry      Component Value Date/Time   NA 143 04/09/2015 0436   NA 141 02/25/2015 1107  K 4.6 04/09/2015 0436   K 5.2* 02/25/2015 1107   CL 108 04/09/2015 0436   CO2 27 04/09/2015 0436   CO2 27 02/25/2015 1107   BUN 12 04/09/2015 0436   BUN 15.5 02/25/2015 1107   CREATININE 0.99 04/09/2015 0436   CREATININE 1.1 02/25/2015 1107      Component Value Date/Time   CALCIUM 8.8* 04/09/2015 0436   CALCIUM 9.2 02/25/2015 1107   ALKPHOS 71 02/25/2015 1107   ALKPHOS 83 06/26/2012 0200   AST 16 02/25/2015 1107   AST 22 06/26/2012 0200   ALT 9 02/25/2015 1107   ALT 13 06/26/2012 0200   BILITOT 0.22 02/25/2015 1107   BILITOT 0.2* 06/26/2012 0200       Lab Results  Component Value Date   WBC 4.7 04/09/2015   HGB 10.5* 04/09/2015   HCT 33.6*  04/09/2015   MCV 95.2 04/09/2015   PLT 281 04/09/2015   NEUTROABS 1.5 02/25/2015     ASSESSMENT & PLAN:  Breast cancer of upper-inner quadrant of right female breast 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 ER 0%, PR 0%, HER-2 negative Ki-67 39%, 2.6 cm mass Right axillary lymph node biopsy positive for invasive ductal carcinoma ER 0% PR 0% HER-2 negative Ki-67 43% Clinical stage: T2 N1 M0 stage IIB Treatment summary: Neoadjuvant chemotherapy with weekly Taxol and carboplatin 12 started 12/10/2014 completed 02/25/2015 Right lumpectomy 04/08/2015 : Multifocal invasive ductal carcinoma 2 foci 2.5 cm, 0.5 cm with DCIS, margins negative closes 0.1 cm, 1/33 lymph nodes positive T2 N1 a M0 stage IIB pathologic stage ------------------------------------------------------------------------------------------------------------------------------------------------------------ Pathology counseling: I discussed the final pathology report and provided her with a copy of this result. Final pathologic stage is T2 N1 stage IIB. Adjuvant XRT from 05/19/15 to 07/03/15  Treatment plan: Surveillance 1. Breast exam shows markedly lymphedema 2. Mammograms 03/24/2016: Benign, breast density category B  I encouraged her to do physical activity and exercise. She will try to do 5-10 minutes of exercise on her stationary bike. She volunteers once a week at the [facility] which has been very helpful to her as well emotionally.  Return to clinic in 1 year for follow-up   No orders of the defined types were placed in this encounter.   The patient has a good understanding of the overall plan. she agrees with it. she will call with any problems that may develop before the next visit here.   Rulon Eisenmenger, MD 04/15/2016

## 2016-04-15 NOTE — Telephone Encounter (Signed)
appt made and avs printed °

## 2016-05-25 DIAGNOSIS — H353131 Nonexudative age-related macular degeneration, bilateral, early dry stage: Secondary | ICD-10-CM | POA: Diagnosis not present

## 2016-05-25 DIAGNOSIS — H401131 Primary open-angle glaucoma, bilateral, mild stage: Secondary | ICD-10-CM | POA: Diagnosis not present

## 2016-07-02 DIAGNOSIS — E113293 Type 2 diabetes mellitus with mild nonproliferative diabetic retinopathy without macular edema, bilateral: Secondary | ICD-10-CM | POA: Diagnosis not present

## 2016-07-02 DIAGNOSIS — Z7984 Long term (current) use of oral hypoglycemic drugs: Secondary | ICD-10-CM | POA: Diagnosis not present

## 2016-07-02 DIAGNOSIS — D649 Anemia, unspecified: Secondary | ICD-10-CM | POA: Diagnosis not present

## 2016-08-31 DIAGNOSIS — H353131 Nonexudative age-related macular degeneration, bilateral, early dry stage: Secondary | ICD-10-CM | POA: Diagnosis not present

## 2016-08-31 DIAGNOSIS — H401131 Primary open-angle glaucoma, bilateral, mild stage: Secondary | ICD-10-CM | POA: Diagnosis not present

## 2016-10-15 ENCOUNTER — Other Ambulatory Visit: Payer: Self-pay | Admitting: Nurse Practitioner

## 2016-11-18 DIAGNOSIS — I10 Essential (primary) hypertension: Secondary | ICD-10-CM | POA: Diagnosis not present

## 2016-11-18 DIAGNOSIS — I951 Orthostatic hypotension: Secondary | ICD-10-CM | POA: Diagnosis not present

## 2016-11-24 DIAGNOSIS — C50911 Malignant neoplasm of unspecified site of right female breast: Secondary | ICD-10-CM | POA: Diagnosis not present

## 2016-12-07 DIAGNOSIS — E119 Type 2 diabetes mellitus without complications: Secondary | ICD-10-CM | POA: Diagnosis not present

## 2016-12-07 DIAGNOSIS — H401131 Primary open-angle glaucoma, bilateral, mild stage: Secondary | ICD-10-CM | POA: Diagnosis not present

## 2017-01-19 DIAGNOSIS — R899 Unspecified abnormal finding in specimens from other organs, systems and tissues: Secondary | ICD-10-CM | POA: Diagnosis not present

## 2017-01-19 DIAGNOSIS — Z0000 Encounter for general adult medical examination without abnormal findings: Secondary | ICD-10-CM | POA: Diagnosis not present

## 2017-01-19 DIAGNOSIS — H6120 Impacted cerumen, unspecified ear: Secondary | ICD-10-CM | POA: Diagnosis not present

## 2017-01-19 DIAGNOSIS — E113293 Type 2 diabetes mellitus with mild nonproliferative diabetic retinopathy without macular edema, bilateral: Secondary | ICD-10-CM | POA: Diagnosis not present

## 2017-01-19 DIAGNOSIS — H6121 Impacted cerumen, right ear: Secondary | ICD-10-CM | POA: Diagnosis not present

## 2017-01-19 DIAGNOSIS — Z7984 Long term (current) use of oral hypoglycemic drugs: Secondary | ICD-10-CM | POA: Diagnosis not present

## 2017-01-19 DIAGNOSIS — I10 Essential (primary) hypertension: Secondary | ICD-10-CM | POA: Diagnosis not present

## 2017-01-19 DIAGNOSIS — E782 Mixed hyperlipidemia: Secondary | ICD-10-CM | POA: Diagnosis not present

## 2017-01-19 DIAGNOSIS — M85851 Other specified disorders of bone density and structure, right thigh: Secondary | ICD-10-CM | POA: Diagnosis not present

## 2017-01-19 DIAGNOSIS — M810 Age-related osteoporosis without current pathological fracture: Secondary | ICD-10-CM | POA: Diagnosis not present

## 2017-01-19 DIAGNOSIS — Z853 Personal history of malignant neoplasm of breast: Secondary | ICD-10-CM | POA: Diagnosis not present

## 2017-01-19 DIAGNOSIS — E039 Hypothyroidism, unspecified: Secondary | ICD-10-CM | POA: Diagnosis not present

## 2017-02-24 DIAGNOSIS — M810 Age-related osteoporosis without current pathological fracture: Secondary | ICD-10-CM | POA: Diagnosis not present

## 2017-03-08 DIAGNOSIS — H353131 Nonexudative age-related macular degeneration, bilateral, early dry stage: Secondary | ICD-10-CM | POA: Diagnosis not present

## 2017-03-08 DIAGNOSIS — H401131 Primary open-angle glaucoma, bilateral, mild stage: Secondary | ICD-10-CM | POA: Diagnosis not present

## 2017-03-24 ENCOUNTER — Other Ambulatory Visit: Payer: Self-pay | Admitting: Hematology and Oncology

## 2017-03-24 DIAGNOSIS — Z1231 Encounter for screening mammogram for malignant neoplasm of breast: Secondary | ICD-10-CM

## 2017-03-24 DIAGNOSIS — Z853 Personal history of malignant neoplasm of breast: Secondary | ICD-10-CM

## 2017-04-14 ENCOUNTER — Ambulatory Visit (HOSPITAL_BASED_OUTPATIENT_CLINIC_OR_DEPARTMENT_OTHER): Payer: Medicare Other | Admitting: Hematology and Oncology

## 2017-04-14 ENCOUNTER — Encounter: Payer: Self-pay | Admitting: Hematology and Oncology

## 2017-04-14 DIAGNOSIS — R5383 Other fatigue: Secondary | ICD-10-CM

## 2017-04-14 DIAGNOSIS — Z171 Estrogen receptor negative status [ER-]: Secondary | ICD-10-CM

## 2017-04-14 DIAGNOSIS — C50211 Malignant neoplasm of upper-inner quadrant of right female breast: Secondary | ICD-10-CM | POA: Diagnosis not present

## 2017-04-14 MED ORDER — LISINOPRIL 20 MG PO TABS: 20.0000 mg | ORAL_TABLET | Freq: Every day | ORAL | Status: AC

## 2017-04-14 NOTE — Progress Notes (Signed)
Patient Care Team: Hulan Fess, MD as PCP - General Excell Seltzer, MD as Consulting Physician (General Surgery) Nicholas Lose, MD as Consulting Physician (Hematology and Oncology) Thea Silversmith, MD (Inactive) as Consulting Physician (Radiation Oncology) Rockwell Germany, RN as Registered Nurse Mauro Kaufmann, RN as Registered Nurse Jake Shark Johny Blamer, NP as Nurse Practitioner (Hematology and Oncology)  DIAGNOSIS:  Encounter Diagnosis  Name Primary?  . Malignant neoplasm of upper-inner quadrant of right breast in female, estrogen receptor negative ([place])     SUMMARY OF ONCOLOGIC HISTORY:   Breast cancer of upper-inner quadrant of right female breast ([place])   10/22/2014 Mammogram    Highly suspicious 2.6 cm mass in the upper inner quadrant of the right breast at the 2 o'clock , Pathologic right axillary lymph nodes, including a node in the mid axillary line with eccentric cortical thickening up to 5 mm      11/01/2014 Initial Biopsy    Right breast biopsy 2:00: IDC with DCIS, ER/PR 0%, Ki-67 39%, HER-2 negative; right axillary lymph node biopsy: Positive for IDC ER/PR 0%, 43%, HER-2 Negative Ratio 0.94      11/15/2014 Breast MRI    Right breast: total extent of disease in upper inner portion including adjacent satellite nodules / adjacent linear non mass enhancement measuring up to 4.8 cm. Close to pectoralis. Lymph nodes with mild irregularity/thickened cortices in the right axilla      11/15/2014 Clinical Stage    Stage IIB: T2 N1      12/10/2014 - 02/25/2015 Neo-Adjuvant Chemotherapy    Weekly paclitaxel and carboplatin 12      03/04/2015 Breast MRI    Interval decrease in size of enhancing mass within the upper inner right breast posterior depth. Adjacent nodules and non mass enhancement are grossly similar when compared to prior exam      04/08/2015 Definitive Surgery    Right lumpectomy/[facility]): Multifocal invasive ductal carcinoma 2 foci 2.5 cm, 0.5 cm  with DCIS, margins negative closest 0.1 cm, 1/33 lymph nodes positive       04/08/2015 Pathologic Stage    Stage IIB: ypT2(m) ypN1a      05/19/2015 - 07/03/2015 Radiation Therapy    Adjuvant RT [facility] Led[facility]): Right breast / 45 Gray @ 1.8 Pearline Cables per fraction x 25 fractions; Right SCLV/PAB / 45 Gy at 1.6v Gy per fraction x 25 fractions; Right breast boost / 16 Gray at [employer] per fraction x 8 fractions. Total dose: 61 Gy.      09/18/2015 Survivorship    Survivorship visit completed and copy of care plan given to patient.       CHIEF COMPLIANT: surveillance of breast cancer, fatigue  INTERVAL HISTORY: Brianna Valdez is a 83 year old with above-mentioned history of profound right breast cancer treated with lumpectomy after neoadjuvant chemotherapy. She also underwent radiation therapy and is currently on surveillance. She complains of fatigue  But otherwise she does not have any lumps or nodules in the breast.  REVIEW OF SYSTEMS:   Constitutional: Denies fevers, chills or abnormal weight loss Eyes: Denies blurriness of vision Ears, nose, mouth, throat, and face: Denies mucositis or sore throat Respiratory: Denies cough, dyspnea or wheezes Cardiovascular: Denies palpitation, chest discomfort Gastrointestinal:  Denies nausea, heartburn or change in bowel habits Skin: Denies abnormal skin rashes Lymphatics: Denies new lymphadenopathy or easy bruising Neurological:Denies numbness, tingling or new weaknesses Behavioral/Psych: Mood is stable, no new changes  Extremities: No lower extremity edema Breast:  denies any pain or lumps or nodules  in either breasts All other systems were reviewed with the patient and are negative.  I have reviewed the past medical history, past surgical history, social history and family history with the patient and they are unchanged from previous note.  ALLERGIES:  has No Known Allergies.  MEDICATIONS:  Current Outpatient Prescriptions  Medication Sig  Dispense Refill  . brimonidine (ALPHAGAN) 0.15 % ophthalmic solution Place 1 drop into both eyes 2 (two) times daily.    Marland Kitchen glimepiride (AMARYL) 1 MG tablet Take 1 mg by mouth daily with breakfast.   6  . levothyroxine (SYNTHROID, LEVOTHROID) 50 MCG tablet Take 50 mcg by mouth daily before breakfast.    . lisinopril (PRINIVIL,ZESTRIL) 20 MG tablet Take 1 tablet (20 mg total) by mouth daily.    . LUTEIN PO Take 25 mg by mouth daily.    . Magnesium 250 MG TABS Take 250 mg by mouth daily with supper.    . metFORMIN (GLUCOPHAGE) 500 MG tablet Take 500-1,000 mg by mouth 2 (two) times daily with a meal. Take 2 tablets (1000 mg) with breakfast and 1 tablet (500 mg) with supper    . Misc Natural Products (OSTEO BI-FLEX JOINT SHIELD PO) Take 1 tablet by mouth daily with supper.     . Multiple Vitamin (MULTIVITAMIN WITH MINERALS) TABS tablet Take by mouth 2 (two) times daily. 1 packet of Daily Advantage Vitamins twice daily (minus the fish oil capsule)    . OVER THE COUNTER MEDICATION Take 1 tablet by mouth daily with breakfast. Blood Builder    . OVER THE COUNTER MEDICATION Take 1 tablet by mouth daily with breakfast. Neuro Support Plus    . OVER THE COUNTER MEDICATION Take 1 tablet by mouth at bedtime as needed (sleep). Pure Sleep    . OVER THE COUNTER MEDICATION Sun chlorella Takes 4-6 tabs every morning after breakfast    . Probiotic Product (PROBIOTIC DAILY PO) Take 1 tablet by mouth daily with supper. Probiotic Advantage Ultra 20    . UNABLE TO FIND 1 each by Other route as needed. Dispense per medical necessity class 1 compression sleeve 20-48m/Hg and gauntlet for hx of breast cancer s/p surgery 1 each 1   No current facility-administered medications for this visit.     PHYSICAL EXAMINATION: ECOG PERFORMANCE STATUS: 1 - Symptomatic but completely ambulatory  Vitals:   04/14/17 1346  BP: (!) 129/44  Pulse: 82  Resp: 16  Temp: 98.1 F (36.7 C)   Filed Weights   04/14/17 1346  Weight: 151  lb 1.6 oz (68.5 kg)    GENERAL:alert, no distress and comfortable SKIN: skin color, texture, turgor are normal, no rashes or significant lesions EYES: normal, Conjunctiva are pink and non-injected, sclera clear OROPHARYNX:no exudate, no erythema and lips, buccal mucosa, and tongue normal  NECK: supple, thyroid normal size, non-tender, without nodularity LYMPH:  no palpable lymphadenopathy in the cervical, axillary or inguinal LUNGS: clear to auscultation and percussion with normal breathing effort HEART: regular rate & rhythm and no murmurs and no lower extremity edema ABDOMEN:abdomen soft, non-tender and normal bowel sounds MUSCULOSKELETAL:no cyanosis of digits and no clubbing  NEURO: alert & oriented x 3 with fluent speech, no focal motor/sensory deficits EXTREMITIES: No lower extremity edema BREAST: No palpable masses or nodules in either right or left breasts. No palpable axillary supraclavicular or infraclavicular adenopathy no breast tenderness or nipple discharge. (exam performed in the presence of a chaperone)  LABORATORY DATA:  I have reviewed the data as listed  Chemistry      Component Value Date/Time   NA 143 04/09/2015 0436   NA 141 02/25/2015 1107   K 4.6 04/09/2015 0436   K 5.2 (H) 02/25/2015 1107   CL 108 04/09/2015 0436   CO2 27 04/09/2015 0436   CO2 27 02/25/2015 1107   BUN 12 04/09/2015 0436   BUN 15.5 02/25/2015 1107   CREATININE 0.99 04/09/2015 0436   CREATININE 1.1 02/25/2015 1107      Component Value Date/Time   CALCIUM 8.8 (L) 04/09/2015 0436   CALCIUM 9.2 02/25/2015 1107   ALKPHOS 71 02/25/2015 1107   AST 16 02/25/2015 1107   ALT 9 02/25/2015 1107   BILITOT 0.22 02/25/2015 1107       Lab Results  Component Value Date   WBC 4.7 04/09/2015   HGB 10.5 (L) 04/09/2015   HCT 33.6 (L) 04/09/2015   MCV 95.2 04/09/2015   PLT 281 04/09/2015   NEUTROABS 1.5 02/25/2015    ASSESSMENT & PLAN:  Breast cancer of upper-inner quadrant of right female  breast 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 ER 0%, PR 0%, HER-2 negative Ki-67 39%, 2.6 cm mass Right axillary lymph node biopsy positive for invasive ductal carcinoma ER 0% PR 0% HER-2 negative Ki-67 43% Clinical stage: T2 N1 M0 stage IIB Treatment summary: Neoadjuvant chemotherapy with weekly Taxol and carboplatin 12 started 12/10/2014 completed 02/25/2015 Right lumpectomy 04/08/2015 : Multifocal invasive ductal carcinoma 2 foci 2.5 cm, 0.5 cm with DCIS, margins negative closes 0.1 cm, 1/33 lymph nodes positive T2 N1 a M0 stage IIB pathologic stage ------------------------------------------------------------------------------------------------------------------------------------------------------------ Pathology counseling: I discussed the final pathology report and provided her with a copy of this result. Final pathologic stage is T2 N1 stage IIB. Adjuvant XRT from 05/19/15 to 07/03/15  Treatment plan: Surveillance 1. Breast exam 07/12/2018shows markedly lymphedema 2. Mammograms 03/24/2016: Benign, breast density category B Next mammogram has been set up for 04/26/2017  She volunteers once a week at the [facility] which has been very helpful to her as well emotionally.  Return to clinic in 1 year for follow-up   I spent 25 minutes talking to the patient of which more than half was spent in counseling and coordination of care.  No orders of the defined types were placed in this encounter.  The patient has a good understanding of the overall plan. she agrees with it. she will call with any problems that may develop before the next visit here.   Rulon Eisenmenger, MD 04/14/17

## 2017-04-14 NOTE — Assessment & Plan Note (Signed)
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 ER 0%, PR 0%, HER-2 negative Ki-67 39%, 2.6 cm mass Right axillary lymph node biopsy positive for invasive ductal carcinoma ER 0% PR 0% HER-2 negative Ki-67 43% Clinical stage: T2 N1 M0 stage IIB Treatment summary: Neoadjuvant chemotherapy with weekly Taxol and carboplatin 12 started 12/10/2014 completed 02/25/2015 Right lumpectomy 04/08/2015 : Multifocal invasive ductal carcinoma 2 foci 2.5 cm, 0.5 cm with DCIS, margins negative closes 0.1 cm, 1/33 lymph nodes positive T2 N1 a M0 stage IIB pathologic stage ------------------------------------------------------------------------------------------------------------------------------------------------------------ Pathology counseling: I discussed the final pathology report and provided her with a copy of this result. Final pathologic stage is T2 N1 stage IIB. Adjuvant XRT from 05/19/15 to 07/03/15  Treatment plan: Surveillance 1. Breast exam 07/12/2018shows markedly lymphedema 2. Mammograms 03/24/2016: Benign, breast density category B Next mammogram has been set up for 04/26/2017  She volunteers once a week at the [facility] which has been very helpful to her as well emotionally.  Return to clinic in 1 year for follow-up

## 2017-04-28 ENCOUNTER — Ambulatory Visit
Admission: RE | Admit: 2017-04-28 | Discharge: 2017-04-28 | Disposition: A | Discharge status: Home / Self Care | Payer: Medicare Other | Source: Ambulatory Visit | Attending: Hematology and Oncology | Admitting: Hematology and Oncology

## 2017-04-28 DIAGNOSIS — Z853 Personal history of malignant neoplasm of breast: Secondary | ICD-10-CM

## 2017-04-28 DIAGNOSIS — R928 Other abnormal and inconclusive findings on diagnostic imaging of breast: Secondary | ICD-10-CM | POA: Diagnosis not present

## 2017-05-17 DIAGNOSIS — S92354A Nondisplaced fracture of fifth metatarsal bone, right foot, initial encounter for closed fracture: Secondary | ICD-10-CM | POA: Diagnosis not present

## 2017-05-20 DIAGNOSIS — S92354A Nondisplaced fracture of fifth metatarsal bone, right foot, initial encounter for closed fracture: Secondary | ICD-10-CM | POA: Diagnosis not present

## 2017-07-27 DIAGNOSIS — H353131 Nonexudative age-related macular degeneration, bilateral, early dry stage: Secondary | ICD-10-CM | POA: Diagnosis not present

## 2017-07-27 DIAGNOSIS — H401131 Primary open-angle glaucoma, bilateral, mild stage: Secondary | ICD-10-CM | POA: Diagnosis not present

## 2017-10-18 DIAGNOSIS — E119 Type 2 diabetes mellitus without complications: Secondary | ICD-10-CM | POA: Diagnosis not present

## 2018-01-17 DIAGNOSIS — H401131 Primary open-angle glaucoma, bilateral, mild stage: Secondary | ICD-10-CM | POA: Diagnosis not present

## 2018-01-17 DIAGNOSIS — E119 Type 2 diabetes mellitus without complications: Secondary | ICD-10-CM | POA: Diagnosis not present

## 2018-02-17 DIAGNOSIS — E1169 Type 2 diabetes mellitus with other specified complication: Secondary | ICD-10-CM | POA: Diagnosis not present

## 2018-02-17 DIAGNOSIS — G720 Drug-induced myopathy: Secondary | ICD-10-CM | POA: Diagnosis not present

## 2018-02-17 DIAGNOSIS — Z0000 Encounter for general adult medical examination without abnormal findings: Secondary | ICD-10-CM | POA: Diagnosis not present

## 2018-02-17 DIAGNOSIS — Z853 Personal history of malignant neoplasm of breast: Secondary | ICD-10-CM | POA: Diagnosis not present

## 2018-02-17 DIAGNOSIS — E782 Mixed hyperlipidemia: Secondary | ICD-10-CM | POA: Diagnosis not present

## 2018-02-17 DIAGNOSIS — I10 Essential (primary) hypertension: Secondary | ICD-10-CM | POA: Diagnosis not present

## 2018-02-17 DIAGNOSIS — H6123 Impacted cerumen, bilateral: Secondary | ICD-10-CM | POA: Diagnosis not present

## 2018-02-17 DIAGNOSIS — R8290 Unspecified abnormal findings in urine: Secondary | ICD-10-CM | POA: Diagnosis not present

## 2018-02-17 DIAGNOSIS — E039 Hypothyroidism, unspecified: Secondary | ICD-10-CM | POA: Diagnosis not present

## 2018-02-17 DIAGNOSIS — M810 Age-related osteoporosis without current pathological fracture: Secondary | ICD-10-CM | POA: Diagnosis not present

## 2018-03-07 ENCOUNTER — Telehealth: Payer: Self-pay | Admitting: Hematology and Oncology

## 2018-03-07 NOTE — Telephone Encounter (Signed)
Called pt, no answer.  Also called cell phone; voicemail not set up.  Resch appt from 7/18 to 7/16 @ 2:15.  Mailed calendar.

## 2018-03-13 ENCOUNTER — Emergency Department (HOSPITAL_BASED_OUTPATIENT_CLINIC_OR_DEPARTMENT_OTHER): Payer: Medicare HMO

## 2018-03-13 ENCOUNTER — Other Ambulatory Visit: Payer: Self-pay

## 2018-03-13 ENCOUNTER — Inpatient Hospital Stay (HOSPITAL_BASED_OUTPATIENT_CLINIC_OR_DEPARTMENT_OTHER)
Admission: EM | Admit: 2018-03-13 | Discharge: 2018-03-16 | DRG: 598 | Disposition: A | Discharge status: Home / Self Care | Payer: Medicare HMO | Attending: Family Medicine | Admitting: Family Medicine

## 2018-03-13 ENCOUNTER — Encounter (HOSPITAL_BASED_OUTPATIENT_CLINIC_OR_DEPARTMENT_OTHER): Payer: Self-pay

## 2018-03-13 DIAGNOSIS — J910 Malignant pleural effusion: Secondary | ICD-10-CM | POA: Diagnosis present

## 2018-03-13 DIAGNOSIS — Z7984 Long term (current) use of oral hypoglycemic drugs: Secondary | ICD-10-CM | POA: Diagnosis not present

## 2018-03-13 DIAGNOSIS — Z515 Encounter for palliative care: Secondary | ICD-10-CM | POA: Diagnosis present

## 2018-03-13 DIAGNOSIS — C50211 Malignant neoplasm of upper-inner quadrant of right female breast: Secondary | ICD-10-CM | POA: Diagnosis not present

## 2018-03-13 DIAGNOSIS — Z9221 Personal history of antineoplastic chemotherapy: Secondary | ICD-10-CM | POA: Diagnosis not present

## 2018-03-13 DIAGNOSIS — I4510 Unspecified right bundle-branch block: Secondary | ICD-10-CM | POA: Diagnosis present

## 2018-03-13 DIAGNOSIS — R0602 Shortness of breath: Secondary | ICD-10-CM | POA: Diagnosis not present

## 2018-03-13 DIAGNOSIS — C7800 Secondary malignant neoplasm of unspecified lung: Secondary | ICD-10-CM | POA: Diagnosis present

## 2018-03-13 DIAGNOSIS — C787 Secondary malignant neoplasm of liver and intrahepatic bile duct: Secondary | ICD-10-CM | POA: Diagnosis not present

## 2018-03-13 DIAGNOSIS — K219 Gastro-esophageal reflux disease without esophagitis: Secondary | ICD-10-CM | POA: Diagnosis present

## 2018-03-13 DIAGNOSIS — E890 Postprocedural hypothyroidism: Secondary | ICD-10-CM | POA: Diagnosis present

## 2018-03-13 DIAGNOSIS — C7801 Secondary malignant neoplasm of right lung: Secondary | ICD-10-CM | POA: Diagnosis not present

## 2018-03-13 DIAGNOSIS — E1140 Type 2 diabetes mellitus with diabetic neuropathy, unspecified: Secondary | ICD-10-CM | POA: Diagnosis not present

## 2018-03-13 DIAGNOSIS — Z79890 Hormone replacement therapy (postmenopausal): Secondary | ICD-10-CM | POA: Diagnosis not present

## 2018-03-13 DIAGNOSIS — Z90710 Acquired absence of both cervix and uterus: Secondary | ICD-10-CM

## 2018-03-13 DIAGNOSIS — J90 Pleural effusion, not elsewhere classified: Secondary | ICD-10-CM

## 2018-03-13 DIAGNOSIS — E039 Hypothyroidism, unspecified: Secondary | ICD-10-CM | POA: Diagnosis not present

## 2018-03-13 DIAGNOSIS — C50919 Malignant neoplasm of unspecified site of unspecified female breast: Principal | ICD-10-CM | POA: Diagnosis present

## 2018-03-13 DIAGNOSIS — J9811 Atelectasis: Secondary | ICD-10-CM | POA: Diagnosis not present

## 2018-03-13 DIAGNOSIS — H409 Unspecified glaucoma: Secondary | ICD-10-CM | POA: Diagnosis present

## 2018-03-13 DIAGNOSIS — Z66 Do not resuscitate: Secondary | ICD-10-CM | POA: Diagnosis present

## 2018-03-13 DIAGNOSIS — R000 Tachycardia, unspecified: Secondary | ICD-10-CM | POA: Diagnosis present

## 2018-03-13 DIAGNOSIS — R05 Cough: Secondary | ICD-10-CM | POA: Diagnosis not present

## 2018-03-13 DIAGNOSIS — I10 Essential (primary) hypertension: Secondary | ICD-10-CM | POA: Diagnosis not present

## 2018-03-13 DIAGNOSIS — R634 Abnormal weight loss: Secondary | ICD-10-CM | POA: Diagnosis not present

## 2018-03-13 DIAGNOSIS — Z6827 Body mass index (BMI) 27.0-27.9, adult: Secondary | ICD-10-CM | POA: Diagnosis not present

## 2018-03-13 DIAGNOSIS — R846 Abnormal cytological findings in specimens from respiratory organs and thorax: Secondary | ICD-10-CM | POA: Diagnosis not present

## 2018-03-13 DIAGNOSIS — C7802 Secondary malignant neoplasm of left lung: Secondary | ICD-10-CM | POA: Diagnosis not present

## 2018-03-13 DIAGNOSIS — Z923 Personal history of irradiation: Secondary | ICD-10-CM | POA: Diagnosis not present

## 2018-03-13 LAB — COMPREHENSIVE METABOLIC PANEL
ALK PHOS: 87 U/L (ref 38–126)
ALT: 14 U/L (ref 14–54)
ANION GAP: 11 (ref 5–15)
AST: 29 U/L (ref 15–41)
Albumin: 3.6 g/dL (ref 3.5–5.0)
BUN: 24 mg/dL — ABNORMAL HIGH (ref 6–20)
CALCIUM: 9.3 mg/dL (ref 8.9–10.3)
CHLORIDE: 103 mmol/L (ref 101–111)
CO2: 23 mmol/L (ref 22–32)
Creatinine, Ser: 1.16 mg/dL — ABNORMAL HIGH (ref 0.44–1.00)
GFR calc Af Amer: 48 mL/min — ABNORMAL LOW (ref >60)
GFR calc non Af Amer: 42 mL/min — ABNORMAL LOW (ref >60)
Glucose, Bld: 371 mg/dL — ABNORMAL HIGH (ref 65–99)
Potassium: 4.6 mmol/L (ref 3.5–5.1)
Sodium: 137 mmol/L (ref 135–145)
Total Bilirubin: 0.6 mg/dL (ref 0.3–1.2)
Total Protein: 7.9 g/dL (ref 6.5–8.1)

## 2018-03-13 LAB — CBC WITH DIFFERENTIAL/PLATELET
BASOS PCT: 4 %
Basophils Absolute: 0.2 10*3/uL (ref 0.0–0.1)
Eosinophils Absolute: 0 10*3/uL (ref 0.0–0.7)
Eosinophils Relative: 0 %
HCT: 33.7 % — ABNORMAL LOW (ref 36.0–46.0)
Hemoglobin: 11.1 g/dL — ABNORMAL LOW (ref 12.0–15.0)
LYMPHS ABS: 0.7 10*3/uL (ref 0.7–4.0)
Lymphocytes Relative: 14 %
MCH: 27.8 pg (ref 26.0–34.0)
MCHC: 32.9 g/dL (ref 30.0–36.0)
MCV: 84.3 fL (ref 78.0–100.0)
Monocytes Absolute: 0.4 10*3/uL (ref 0.1–1.0)
Monocytes Relative: 7 %
Neutro Abs: 3.8 10*3/uL (ref 1.7–7.7)
Neutrophils Relative %: 75 %
Platelets: 391 10*3/uL (ref 150–400)
RBC: 4 MIL/uL (ref 3.87–5.11)
RDW: 13 % (ref 11.5–15.5)
WBC: 5.1 10*3/uL (ref 4.0–10.5)

## 2018-03-13 LAB — BRAIN NATRIURETIC PEPTIDE: B NATRIURETIC PEPTIDE 5: 48.1 pg/mL (ref 0.0–100.0)

## 2018-03-13 LAB — TROPONIN I: Troponin I: <0.03 ng/mL (ref <0.03)

## 2018-03-13 LAB — CBG MONITORING, ED: Glucose-Capillary: 160 mg/dL — ABNORMAL HIGH (ref 65–99)

## 2018-03-13 LAB — GLUCOSE, CAPILLARY
GLUCOSE-CAPILLARY: 164 mg/dL — AB (ref 65–99)
Glucose-Capillary: 256 mg/dL — ABNORMAL HIGH (ref 65–99)

## 2018-03-13 MED ORDER — IPRATROPIUM-ALBUTEROL 0.5-2.5 (3) MG/3ML IN SOLN
3.0000 mL | Freq: Four times a day (QID) | RESPIRATORY_TRACT | Status: DC | PRN
  Administered 2018-03-14: 3 mL via RESPIRATORY_TRACT
  Filled 2018-03-13: qty 3

## 2018-03-13 MED ORDER — IPRATROPIUM-ALBUTEROL 0.5-2.5 (3) MG/3ML IN SOLN
3.0000 mL | Freq: Two times a day (BID) | RESPIRATORY_TRACT | Status: DC
  Administered 2018-03-14 – 2018-03-16 (×4): 3 mL via RESPIRATORY_TRACT
  Filled 2018-03-13 (×5): qty 3

## 2018-03-13 MED ORDER — IPRATROPIUM-ALBUTEROL 0.5-2.5 (3) MG/3ML IN SOLN
3.0000 mL | Freq: Four times a day (QID) | RESPIRATORY_TRACT | Status: DC
  Administered 2018-03-13: 3 mL via RESPIRATORY_TRACT

## 2018-03-13 MED ORDER — ONDANSETRON HCL 4 MG PO TABS: 4.0000 mg | ORAL_TABLET | Freq: Four times a day (QID) | ORAL | Status: DC | PRN

## 2018-03-13 MED ORDER — ONDANSETRON HCL 4 MG/2ML IJ SOLN: 4.0000 mg | Freq: Four times a day (QID) | INTRAMUSCULAR | Status: DC | PRN

## 2018-03-13 MED ORDER — INSULIN ASPART 100 UNIT/ML ~~LOC~~ SOLN: 0.0000 [IU] | Freq: Three times a day (TID) | SUBCUTANEOUS | Status: DC

## 2018-03-13 MED ORDER — LISINOPRIL 20 MG PO TABS
20.0000 mg | ORAL_TABLET | Freq: Every day | ORAL | Status: DC
  Administered 2018-03-13 – 2018-03-16 (×4): 20 mg via ORAL
  Filled 2018-03-13 (×4): qty 1

## 2018-03-13 MED ORDER — MAGNESIUM CITRATE PO SOLN: 1.0000 | Freq: Once | ORAL | Status: DC | PRN

## 2018-03-13 MED ORDER — RISAQUAD PO CAPS
ORAL_CAPSULE | Freq: Every day | ORAL | Status: DC
  Administered 2018-03-14 – 2018-03-15 (×2): 1 via ORAL
  Filled 2018-03-13 (×2): qty 1

## 2018-03-13 MED ORDER — LUTEIN 20 MG PO CAPS: 25.0000 mg | ORAL_CAPSULE | Freq: Every day | ORAL | Status: DC

## 2018-03-13 MED ORDER — SENNOSIDES-DOCUSATE SODIUM 8.6-50 MG PO TABS: 1.0000 | ORAL_TABLET | Freq: Every evening | ORAL | Status: DC | PRN

## 2018-03-13 MED ORDER — MAGNESIUM OXIDE 400 (241.3 MG) MG PO TABS
200.0000 mg | ORAL_TABLET | Freq: Every day | ORAL | Status: DC
  Administered 2018-03-14 – 2018-03-15 (×2): 200 mg via ORAL
  Filled 2018-03-13 (×2): qty 1

## 2018-03-13 MED ORDER — PROSIGHT PO TABS
1.0000 | ORAL_TABLET | Freq: Every day | ORAL | Status: DC
  Administered 2018-03-13 – 2018-03-16 (×4): 1 via ORAL
  Filled 2018-03-13 (×4): qty 1

## 2018-03-13 MED ORDER — ENOXAPARIN SODIUM 40 MG/0.4ML ~~LOC~~ SOLN
40.0000 mg | SUBCUTANEOUS | Status: DC
  Administered 2018-03-13 – 2018-03-14 (×2): 40 mg via SUBCUTANEOUS
  Filled 2018-03-13 (×2): qty 0.4

## 2018-03-13 MED ORDER — LEVOTHYROXINE SODIUM 50 MCG PO TABS
50.0000 ug | ORAL_TABLET | Freq: Every day | ORAL | Status: DC
  Administered 2018-03-15 – 2018-03-16 (×2): 50 ug via ORAL
  Filled 2018-03-13 (×2): qty 1

## 2018-03-13 MED ORDER — ACETAMINOPHEN 650 MG RE SUPP: 650.0000 mg | Freq: Four times a day (QID) | RECTAL | Status: DC | PRN

## 2018-03-13 MED ORDER — INSULIN ASPART 100 UNIT/ML ~~LOC~~ SOLN
0.0000 [IU] | Freq: Every day | SUBCUTANEOUS | Status: DC
  Administered 2018-03-13: 3 [IU] via SUBCUTANEOUS

## 2018-03-13 MED ORDER — ACETAMINOPHEN 325 MG PO TABS
650.0000 mg | ORAL_TABLET | Freq: Four times a day (QID) | ORAL | Status: DC | PRN
  Administered 2018-03-16: 650 mg via ORAL
  Filled 2018-03-13: qty 2

## 2018-03-13 MED ORDER — BISACODYL 5 MG PO TBEC: 5.0000 mg | DELAYED_RELEASE_TABLET | Freq: Every day | ORAL | Status: DC | PRN

## 2018-03-13 MED ORDER — OXYCODONE HCL 5 MG PO TABS
5.0000 mg | ORAL_TABLET | ORAL | Status: DC | PRN
  Administered 2018-03-14 – 2018-03-16 (×2): 5 mg via ORAL
  Filled 2018-03-13 (×2): qty 1

## 2018-03-13 MED ORDER — IOPAMIDOL (ISOVUE-300) INJECTION 61%
80.0000 mL | Freq: Once | INTRAVENOUS | Status: AC | PRN
  Administered 2018-03-13: 100 mL via INTRAVENOUS

## 2018-03-13 MED ORDER — ADULT MULTIVITAMIN W/MINERALS CH
1.0000 | ORAL_TABLET | Freq: Every day | ORAL | Status: DC
  Administered 2018-03-13 – 2018-03-16 (×4): 1 via ORAL
  Filled 2018-03-13 (×4): qty 1

## 2018-03-13 MED ORDER — MAGNESIUM 250 MG PO TABS: 250.0000 mg | ORAL_TABLET | Freq: Every day | ORAL | Status: DC

## 2018-03-13 MED ORDER — BRIMONIDINE TARTRATE 0.15 % OP SOLN
1.0000 [drp] | Freq: Two times a day (BID) | OPHTHALMIC | Status: DC
  Administered 2018-03-14 – 2018-03-16 (×4): 1 [drp] via OPHTHALMIC
  Filled 2018-03-13: qty 5

## 2018-03-13 NOTE — ED Notes (Signed)
EDP at bedside  

## 2018-03-13 NOTE — ED Notes (Signed)
160 CBG

## 2018-03-13 NOTE — ED Provider Notes (Signed)
Emergency Department Provider Note   I have reviewed the triage vital signs and the nursing notes.   HISTORY  Chief Complaint Shortness of Breath   HPI Brianna Valdez is a 84 y.o. female with multiple medical problems documented below the presents the emergency department today with shortness of breath.  The patient had a URI couple weeks ago but then a few days ago patient noticed that her shortness of breath significant a worsened.  She still had a bit of cough that did not have any purulent sputum.  She had any fevers.  She states shortness of breath is been worsening with exertion but now is also worse with talking.  States that before she would walk without difficulty but now she can barely walk a few steps ago and severely short of breath.  Then just today she is having difficulty speaking because the same thing.  No lower extremity swelling.  No abdominal pain or distention.  No chest pain.  There is apparently cancer free as of July 2018 but is due for repeat mammogram soon.  No history of blood clots or heart disease. No other associated or modifying symptoms.    Past Medical History:  Diagnosis Date  . Anemia   . Arthritis   . Breast cancer of upper-inner quadrant of left female breast ([place]) 11/06/2014  . Diabetes mellitus    type 2   . Diabetic neuropathy ([facility])    slight in her feet  . GERD (gastroesophageal reflux disease)   . Glaucoma   . Hypertension   . Thyroid disease     Patient Active Problem List   Diagnosis Date Noted  . Breast cancer ([place]) 04/08/2015  . Breast cancer of upper-inner quadrant of right female breast ([place]) 11/06/2014    Past Surgical History:  Procedure Laterality Date  . ABDOMINAL HYSTERECTOMY    . BREAST LUMPECTOMY Right   . BREAST LUMPECTOMY WITH SENTINEL LYMPH NODE BIOPSY Right 04/08/2015  . BREAST LUMPECTOMY WITH SENTINEL LYMPH NODE BIOPSY Right 04/08/2015   Procedure: RIGHT BREAST LUMPECTOMY WITH RIGHT AXILLARY SENTINEL LYMPH  NODE BIOPSY;  Surgeon: Excell Seltzer, MD;  Location: [facility];  Service: General;  Laterality: Right;  . COLONOSCOPY    . EYE SURGERY Bilateral    cataract surgery with lens implant  . PORT-A-CATH REMOVAL Left 04/08/2015   Procedure: REMOVAL PORT-A-CATH LEFT CHEST;  Surgeon: Excell Seltzer, MD;  Location: [facility];  Service: General;  Laterality: Left;  . PORTACATH PLACEMENT Left 11/26/2014   Procedure: INSERTION PORT-A-CATH;  Surgeon: Excell Seltzer, MD;  Location: [facility];  Service: General;  Laterality: Left;  . PORTACATH PLACEMENT Left 11/26/2014   Procedure: REPOSITION OF A PORT-A-CATH;  Surgeon: Excell Seltzer, MD;  Location: [facility];  Service: General;  Laterality: Left;  . THYROIDECTOMY      Current Outpatient Rx  . Order #: [id number] Class: Historical Med  . Order #: [id number] Class: Historical Med  . Order #: [id number] Class: Historical Med  . Order #: [id number] Class: No Print  . Order #: [id number] Class: Historical Med  . Order #: [id number] Class: Historical Med  . Order #: [id number] Class: Historical Med  . Order #: [id number] Class: Historical Med  . Order #: [id number] Class: Historical Med  . Order #: [id number] Class: Historical Med  . Order #: [id number] Class: Historical Med  . Order #: [id number] Class: Historical Med  . Order #: [id number] Class: Historical Med  . Order #: [id number] Class: Historical Med  . Order #: [id number] Class: Print    Allergies Patient has no known allergies.  No family history on file.  Social History Social History   Tobacco Use  . Smoking status: Never Smoker  . Smokeless tobacco: Never Used  Substance Use Topics  . Alcohol use: No    Alcohol/week: 0.0 oz  . Drug use: No    Review of Systems  All other systems negative except as documented in the HPI. All pertinent positives and negatives as reviewed in the HPI. ____________________________________________   PHYSICAL EXAM:  VITAL SIGNS: ED Triage Vitals  Enc Vitals Group     BP 03/13/18  1226 (!) 151/76     Pulse Rate 03/13/18 1226 (!) 105     Resp 03/13/18 1226 (!) 24     Temp 03/13/18 1226 98.3 F (36.8 C)     Temp Source 03/13/18 1226 Oral     SpO2 03/13/18 1226 94 %     Weight 03/13/18 1228 147 lb (66.7 kg)     Height 03/13/18 1228 5\' 1"  (1.549 m)    Constitutional: Alert and oriented. Well appearing and in no acute distress. Eyes: Conjunctivae are normal. PERRL. EOMI. Head: Atraumatic. Nose: No congestion/rhinnorhea. Mouth/Throat: Mucous membranes are moist.  Oropharynx non-erythematous. Neck: No stridor.  No meningeal signs.   Cardiovascular: Normal rate, regular rhythm. Good peripheral circulation. Grossly normal heart sounds.   Respiratory: Normal respiratory effort.  No retractions. Lungs diminished in RLL>RUL. Normal in left lung. Gastrointestinal: Soft and nontender. No distention.  Musculoskeletal: No lower extremity tenderness nor edema. No gross deformities of extremities. Neurologic:  Normal speech and language. No gross focal neurologic deficits are appreciated.  Skin:  Skin is warm, dry and intact. No rash noted.   ____________________________________________   LABS (all labs ordered are listed, but only abnormal results are displayed)  Labs Reviewed  COMPREHENSIVE METABOLIC PANEL - Abnormal; Notable for the following components:      Result Value   Glucose, Bld 371 (*)    BUN 24 (*)    Creatinine, Ser 1.16 (*)    GFR calc non Af Amer 42 (*)    GFR calc Af Amer 48 (*)    All other components within normal limits  CBC WITH DIFFERENTIAL/PLATELET - Abnormal; Notable for the following components:   Hemoglobin 11.1 (*)    HCT 33.7 (*)    All other components within normal limits  GLUCOSE, CAPILLARY - Abnormal; Notable for the following components:   Glucose-Capillary 164 (*)    All other components within normal limits  BASIC METABOLIC PANEL - Abnormal; Notable for the following components:   GFR calc non Af Amer 54 (*)    All other  components within normal limits  CBC - Abnormal; Notable for the following components:   RBC 3.69 (*)    Hemoglobin 10.2 (*)    HCT 32.0 (*)    All other components within normal limits  GLUCOSE, CAPILLARY - Abnormal; Notable for the following components:   Glucose-Capillary 256 (*)    All other components within normal limits  GLUCOSE, CAPILLARY - Abnormal; Notable for the following components:   Glucose-Capillary 100 (*)    All other components within normal limits  GLUCOSE, CAPILLARY - Abnormal; Notable for the following components:   Glucose-Capillary 115 (*)    All other components within normal limits  LACTATE DEHYDROGENASE - Abnormal; Notable for the following components:   LDH 204 (*)    All other components within normal limits  LACTATE DEHYDROGENASE, PLEURAL OR PERITONEAL FLUID - Abnormal; Notable for the following components:  LD, Fluid 148 (*)    All other components within normal limits  BODY FLUID CELL COUNT WITH DIFFERENTIAL - Abnormal; Notable for the following components:   Color, Fluid YELLOW (*)    Appearance, Fluid CLOUDY (*)    WBC, Fluid 1,191 (*)    Monocyte-Macrophage-Serous Fluid 6 (*)    All other components within normal limits  GLUCOSE, CAPILLARY - Abnormal; Notable for the following components:   Glucose-Capillary 145 (*)    All other components within normal limits  CBG MONITORING, ED - Abnormal; Notable for the following components:   Glucose-Capillary 160 (*)    All other components within normal limits  GRAM STAIN  CULTURE, BODY FLUID-BOTTLE  TROPONIN I  BRAIN NATRIURETIC PEPTIDE  PROTIME-INR  GLUCOSE, CAPILLARY  PROTEIN, PLEURAL OR PERITONEAL FLUID  CYTOLOGY - NON PAP   ____________________________________________  EKG   EKG Interpretation  Date/Time:  Monday March 13 2018 13:09:23 EDT Ventricular Rate:  103 PR Interval:    QRS Duration: 122 QT Interval:  353 QTC Calculation: 463 R Axis:   -57 Text Interpretation:  Sinus  tachycardia Right bundle branch block Baseline wander in lead(s) V2 since last tracing no significant change Confirmed by Malvin Johns [phone]) on 03/14/2018 9:20:26 AM       ____________________________________________  RADIOLOGY  Dg Chest 1 View  Result Date: 03/14/2018 CLINICAL DATA:  Status post right thoracentesis EXAM: CHEST  1 VIEW COMPARISON:  03/13/2018 FINDINGS: Cardiac shadow is stable. Aortic calcifications are again seen. The left lung is well aerated without focal infiltrate. The previously seen right-sided effusion has decreased significantly following thoracentesis. No pneumothorax is noted. Persistent lower lobe collapse is noted. IMPRESSION: No evidence of pneumothorax following thoracentesis. Electronically Signed   By: Inez Catalina M.D.   On: 03/14/2018 10:28   US Thoracentesis Asp Pleural Space W/img Guide  Result Date: 03/14/2018 INDICATION: History of breast cancer. Recent upper respiratory infection. Persistent cough. Right pleural effusion. Request for diagnostic and therapeutic thoracentesis. EXAM: ULTRASOUND GUIDED RIGHT THORACENTESIS MEDICATIONS: 1% Lidocaine = 10 mL COMPLICATIONS: None immediate. PROCEDURE: An ultrasound guided thoracentesis was thoroughly discussed with the patient and questions answered. The benefits, risks, alternatives and complications were also discussed. The patient understands and wishes to proceed with the procedure. Written consent was obtained. Ultrasound was performed to localize and mark an adequate pocket of fluid in the right chest. The area was then prepped and draped in the normal sterile fashion. 1% Lidocaine was used for local anesthesia. Under ultrasound guidance a 6 Fr Safe-T-Centesis catheter was introduced. Thoracentesis was performed. The catheter was removed and a dressing applied. FINDINGS: A total of approximately 1.28 liters of hazy gold fluid was removed. Samples were sent to the laboratory as requested by the clinical team.  IMPRESSION: Successful ultrasound guided right thoracentesis yielding 1.28 liters of pleural fluid. No pneumothorax on post procedure chest X-ray. Read by: Gareth Eagle, PA-C Electronically Signed   By: Corrie Mckusick D.O.   On: 03/14/2018 10:46    ____________________________________________   PROCEDURES  Procedure(s) performed:   Procedures  CRITICAL CARE Performed by: Merrily Pew Total critical care time: 35 minutes Critical care time was exclusive of separately billable procedures and treating other patients. Critical care was necessary to treat or prevent imminent or life-threatening deterioration. Critical care was time spent personally by me on the following activities: development of treatment plan with patient and/or surrogate as well as nursing, discussions with consultants, evaluation of patient's response to treatment, examination of patient, obtaining history  from patient or surrogate, ordering and performing treatments and interventions, ordering and review of laboratory studies, ordering and review of radiographic studies, pulse oximetry and re-evaluation of patient's condition.  ____________________________________________   INITIAL IMPRESSION / ASSESSMENT AND PLAN / ED COURSE  Pleural effusion of uncertain etiology. Will workup and needs admission 2/2 significant respiratory issues. No indication for emergent thoracentesis at this time.   Ct w/ e/o recurrent malignancy. D/w Dr. Lindi Adie who will see as an inpatietn. Discussed with Dr. Eliseo Squires who will admit to [facility].   Pertinent labs & imaging results that were available during my care of the patient were reviewed by me and considered in my medical decision making (see chart for details).  ____________________________________________  FINAL CLINICAL IMPRESSION(S) / ED DIAGNOSES  Final diagnoses:  Pleural effusion     MEDICATIONS GIVEN DURING THIS VISIT:  Medications - No data to display   NEW OUTPATIENT  MEDICATIONS STARTED DURING THIS VISIT:  New Prescriptions   No medications on file    Note:  This note was prepared with assistance of Dragon voice recognition software. Occasional wrong-word or sound-a-like substitutions may have occurred due to the inherent limitations of voice recognition software.   Merrily Pew, MD 03/14/18 [phone]

## 2018-03-13 NOTE — ED Notes (Signed)
Informed EDP that family would like an update

## 2018-03-13 NOTE — ED Notes (Signed)
SpO2 90-92% on room air. Placed on 2l/m [place] SpO2 96% now.

## 2018-03-13 NOTE — ED Notes (Signed)
Paged Oncology Dr. Lindi Adie at [phone]

## 2018-03-13 NOTE — ED Notes (Signed)
Patient placed on cardiac monitor with vitals set to Q 30 min.

## 2018-03-13 NOTE — ED Notes (Signed)
Patient transported to X-ray 

## 2018-03-13 NOTE — ED Triage Notes (Signed)
Pt c/o SOB x 1 week-prod cough x 2-3 weeks-states she was seen by PCP dx with allergies rx flonase-was seen and sent by [facility] today with paper work that reads pleural effusion-pt NAD-presents in w/c

## 2018-03-13 NOTE — H&P (Signed)
History and Physical   [facility] - [place] @ [place] Admission History and Physical [employer], D.O.    Patient Name: Brianna Valdez MR#: [id number] Date of Birth: 1931-03-13 Date of Admission: 03/13/2018  Referring MD/NP/PA: Dr. Dayna Barker Primary Care Physician: Hulan Fess, MD  Chief Complaint:  Chief Complaint  Patient presents with  . Shortness of Breath    HPI: Brianna Valdez is a 84 y.o. female with a known history of diabetes, hypertension, GERD, breast cancer presents to the emergency department for evaluation of ornis of breath.  Patient was in a usual state of health until several weeks ago when she developed the upper respiratory tract infection which mostly resolved but then recurred and is now associated with slight cough, shortness of breath and dyspnea on exertion.  On my exam patient states that she feels significantly better, denies shortness of breath or chest pain.  No cough at this time.  Patient denies fevers/chills, weakness, dizziness, chest pain, shortness of breath, N/V/C/D, abdominal pain, dysuria/frequency, changes in mental status.     EMS/ED Course:Medical admission has been requested for further management of pleural effusion, probable breast cancer recurrence.  Of note patient is reportedly cancer free as of July 2018 but was due for follow-up mammogram..  Review of Systems:  CONSTITUTIONAL: No fever/chills, fatigue, weakness, weight gain/loss, headache. EYES: No blurry or double vision. ENT: No tinnitus, postnasal drip, redness or soreness of the oropharynx. RESPIRATORY: Positive cough, dyspnea, negative wheeze.  No hemoptysis.  CARDIOVASCULAR: No chest pain, palpitations, syncope, orthopnea. No lower extremity edema.  GASTROINTESTINAL: No nausea, vomiting, abdominal pain, diarrhea, constipation.  No hematemesis, melena or hematochezia. GENITOURINARY: No dysuria, frequency, hematuria. ENDOCRINE: No polyuria or  nocturia. No heat or cold intolerance. HEMATOLOGY: No anemia, bruising, bleeding. INTEGUMENTARY: No rashes, ulcers, lesions. MUSCULOSKELETAL: No arthritis, gout, dyspnea. NEUROLOGIC: No numbness, tingling, ataxia, seizure-type activity, weakness. PSYCHIATRIC: No anxiety, depression, insomnia.   Past Medical History:  Diagnosis Date  . Anemia   . Arthritis   . Breast cancer of upper-inner quadrant of left female breast ([place]) 11/06/2014  . Diabetes mellitus    type 2   . Diabetic neuropathy (HCC)    slight in her feet  . GERD (gastroesophageal reflux disease)   . Glaucoma   . Hypertension   . Thyroid disease     Past Surgical History:  Procedure Laterality Date  . ABDOMINAL HYSTERECTOMY    . BREAST LUMPECTOMY Right   . BREAST LUMPECTOMY WITH SENTINEL LYMPH NODE BIOPSY Right 04/08/2015  . BREAST LUMPECTOMY WITH SENTINEL LYMPH NODE BIOPSY Right 04/08/2015   Procedure: RIGHT BREAST LUMPECTOMY WITH RIGHT AXILLARY SENTINEL LYMPH NODE BIOPSY;  Surgeon: Excell Seltzer, MD;  Location: [facility];  Service: General;  Laterality: Right;  . COLONOSCOPY    . EYE SURGERY Bilateral    cataract surgery with lens implant  . PORT-A-CATH REMOVAL Left 04/08/2015   Procedure: REMOVAL PORT-A-CATH LEFT CHEST;  Surgeon: Excell Seltzer, MD;  Location: [facility];  Service: General;  Laterality: Left;  . PORTACATH PLACEMENT Left 11/26/2014   Procedure: INSERTION PORT-A-CATH;  Surgeon: Excell Seltzer, MD;  Location: [facility];  Service: General;  Laterality: Left;  . PORTACATH PLACEMENT Left 11/26/2014   Procedure: REPOSITION OF A PORT-A-CATH;  Surgeon: Excell Seltzer, MD;  Location: [facility];  Service: General;  Laterality: Left;  . THYROIDECTOMY       reports that she has never smoked. She has never used smokeless tobacco. She reports that she does not drink alcohol or  use drugs.  No Known Allergies  No family history on file.  Prior to Admission medications   Medication Sig Start Date End Date Taking?  Authorizing Provider  brimonidine (ALPHAGAN) 0.15 % ophthalmic solution Place 1 drop into both eyes 2 (two) times daily.   Yes [provider]  glimepiride (AMARYL) 1 MG tablet Take 1 mg by mouth daily with breakfast.  08/09/14  Yes [provider]  levothyroxine (SYNTHROID, LEVOTHROID) 50 MCG tablet Take 50 mcg by mouth daily before breakfast.   Yes [provider]  lisinopril (PRINIVIL,ZESTRIL) 20 MG tablet Take 1 tablet (20 mg total) by mouth daily. 04/14/17  Yes Nicholas Lose, MD  LUTEIN PO Take 25 mg by mouth daily.   Yes [provider]  Magnesium 250 MG TABS Take 250 mg by mouth daily with supper.   Yes [provider]  metFORMIN (GLUCOPHAGE) 500 MG tablet Take 500-1,000 mg by mouth 2 (two) times daily with a meal. Take 2 tablets (1000 mg) with breakfast and 1 tablet (500 mg) with supper   Yes [provider]  Misc Natural Products (OSTEO BI-FLEX JOINT SHIELD PO) Take 1 tablet by mouth daily with supper.    Yes [provider]  Multiple Vitamin (MULTIVITAMIN WITH MINERALS) TABS tablet Take by mouth 2 (two) times daily. 1 packet of Daily Advantage Vitamins twice daily (minus the fish oil capsule)   Yes [provider]  OVER THE COUNTER MEDICATION Take 1 tablet by mouth daily with breakfast. Blood Builder   Yes [provider]  OVER THE COUNTER MEDICATION Sun chlorella Takes 4-6 tabs every morning after breakfast   Yes [provider]  Probiotic Product (PROBIOTIC DAILY PO) Take 1 tablet by mouth daily with supper. Probiotic Advantage Ultra 20   Yes [provider]  OVER THE COUNTER MEDICATION Take 1 tablet by mouth daily with breakfast. Neuro Support Plus    [provider]  UNABLE TO FIND 1 each by Other route as needed. Dispense per medical necessity class 1 compression sleeve 20-35mm/Hg and gauntlet for hx of breast cancer s/p surgery 05/06/15   [facility] Lose, MD    Physical  Exam: Vitals:   03/13/18 1500 03/13/18 1600 03/13/18 1630 03/13/18 1807  BP: (!) 157/78 (!) 191/85 136/88 (!) 167/90  Pulse: 89 94  97  Resp: (!) 23 19 (!) 22 20  Temp:    97.8 F (36.6 C)  TempSrc:    Oral  SpO2: 98% 99% 98% 98%  Weight:      Height:        GENERAL: 84 y.o.-year-old email patient, well-developed, well-nourished lying in the bed in no acute distress.  Pleasant and cooperative.   HEENT: Head atraumatic, normocephalic. Pupils equal. Mucus membranes moist. NECK: Supple. No JVD. CHEST: Creased breath sounds in the right lower lobe no wheezing, rales, rhonchi or crackles. No use of accessory muscles of respiration.  No reproducible chest wall tenderness.  CARDIOVASCULAR: S1, S2 normal. No murmurs, rubs, or gallops. Cap refill <2 seconds. Pulses intact distally.  ABDOMEN: Soft, nondistended, nontender. No rebound, guarding, rigidity. Normoactive bowel sounds present in all four quadrants.  EXTREMITIES: No pedal edema, cyanosis, or clubbing. No calf tenderness or Homan's sign.  NEUROLOGIC: The patient is alert and oriented x 3. Cranial nerves II through XII are grossly intact with no focal sensorimotor deficit. PSYCHIATRIC:  Normal affect, mood, thought content. SKIN: Warm, dry, and intact without obvious rash, lesion, or ulcer.    Labs on Admission:  CBC: Recent Labs  Lab 03/13/18 1319  WBC 5.1  NEUTROABS 3.8  HGB 11.1*  HCT 33.7*  MCV 84.3  PLT 694   Basic Metabolic Panel: Recent Labs  Lab 03/13/18 1319  NA 137  K 4.6  CL 103  CO2 23  GLUCOSE 371*  BUN 24*  CREATININE 1.16*  CALCIUM 9.3   GFR: Estimated Creatinine Clearance: 31 mL/min (A) (by C-G formula based on SCr of 1.16 mg/dL (H)). Liver Function Tests: Recent Labs  Lab 03/13/18 1319  AST 29  ALT 14  ALKPHOS 87  BILITOT 0.6  PROT 7.9  ALBUMIN 3.6   No results for input(s): LIPASE, AMYLASE in the last 168 hours. No results for input(s): AMMONIA in the last 168 hours. Coagulation  Profile: No results for input(s): INR, PROTIME in the last 168 hours. Cardiac Enzymes: Recent Labs  Lab 03/13/18 1319  TROPONINI <0.03   BNP (last 3 results) No results for input(s): PROBNP in the last 8760 hours. HbA1C: No results for input(s): HGBA1C in the last 72 hours. CBG: Recent Labs  Lab 03/13/18 1628 03/13/18 1819  GLUCAP 160* 164*   Lipid Profile: No results for input(s): CHOL, HDL, LDLCALC, TRIG, CHOLHDL, LDLDIRECT in the last 72 hours. Thyroid Function Tests: No results for input(s): TSH, T4TOTAL, FREET4, T3FREE, THYROIDAB in the last 72 hours. Anemia Panel: No results for input(s): VITAMINB12, FOLATE, FERRITIN, TIBC, IRON, RETICCTPCT in the last 72 hours. Urine analysis: No results found for: COLORURINE, APPEARANCEUR, LABSPEC, PHURINE, GLUCOSEU, HGBUR, BILIRUBINUR, KETONESUR, PROTEINUR, UROBILINOGEN, NITRITE, LEUKOCYTESUR Sepsis Labs: @LABRCNTIP (procalcitonin:4,lacticidven:4) )No results found for this or any previous visit (from the past 240 hour(s)).   Radiological Exams on Admission: Dg Chest 2 View  Result Date: 03/13/2018 CLINICAL DATA:  COPD.  Cough. EXAM: CHEST - 2 VIEW COMPARISON:  11/18/2016 FINDINGS: There is a moderate right pleural effusion. There is right lower lobe airspace disease which may reflect atelectasis versus pneumonia. There is bilateral interstitial prominence likely secondary to low lung volumes. There is no pneumothorax. There is no left pleural effusion. The heart and mediastinal contours are unremarkable. There is thoracic aortic atherosclerosis. There is no acute osseous abnormality. IMPRESSION: 1. Moderate right pleural effusion. Right lower lobe airspace disease which may reflect atelectasis versus pneumonia. Electronically Signed   By: Kathreen Devoid   On: 03/13/2018 13:06   Ct Chest W Contrast  Result Date: 03/13/2018 CLINICAL DATA:  84 year old female with 1 week of productive cough. Right pleural effusion identified on chest  radiographs today. Personal history of right breast cancer, 2016. EXAM: CT CHEST WITH CONTRAST TECHNIQUE: Multidetector CT imaging of the chest was performed during intravenous contrast administration. CONTRAST:  115mL ISOVUE-300 IOPAMIDOL (ISOVUE-300) INJECTION 61% COMPARISON:  Chest radiographs 1247 hours today, and earlier. PET-CT 11/29/2014. FINDINGS: Cardiovascular: Calcified aortic atherosclerosis. Calcified coronary artery atherosclerosis and/or stents. Mild cardiomegaly appears stable to slightly increased since 2016. No pericardial effusion. Negative visible aorta aside from the atherosclerosis. Mediastinum/Nodes: No mediastinal mass or lymphadenopathy is evident. Lungs/Pleura: Large mostly layering but also partially loculated (series 2, image 62) right pleural effusion. Simple fluid density (13 Hounsfield units). There is mild pleural enhancement along the loculated component, which is contiguous with a broad-based right anterior chest wall mass on series 2, image 68. The ventral portion of the chest wall masses approximately 32 x 87 x 33 millimeters (AP by transverse by CC), although some of the margins are indistinct. The lower right pectoralis muscle appears infiltrated. Superimposed compressive right lung atelectasis, severe right lower  lobe and middle lobe compression. The major airways are patent aside from some atelectatic changes. In the left lung there is a stable 6 millimeter superior segment lower lobe nodule since 2016, benign. However, there are multiple new indistinct nodular densities along the ventral left upper lobe on series 4, images 46-55. And in the lower lobe along the costophrenic surface series 4, image 95. Additional more vague nodular and irregular peripheral pulmonary opacities also in the lateral upper lobe, left lower lobe (image 57), and lingula (image 80). No left pleural effusion. Upper Abdomen: Possible subtle 14 millimeter hypodense anterior liver lesion on series 2,  image 106, and at the liver dome on image 97. Negative visible gallbladder, spleen, pancreas, adrenal glands, kidneys. Air-fluid level in the visible stomach. Musculoskeletal: Sclerotic and irregular appearance of the anterior right 4th rib in proximity to the chest wall mass is new since 2016. There is similar new abnormal sclerosis in the right half of the sternum on image 66 and image 76. No thoracic vertebral metastasis is identified. IMPRESSION: 1. Large right chest wall mass infiltrating through the ribs into the anterior right pleural space with large partially loculated, mostly layering right pleural effusion. Suspect breast cancer recurrence in this clinical setting. 2. Associated involvement of the right sternum and anterior right 4th rib which have become sclerotic and irregular since 2016. 3. Possible 14 mm liver metastases at the dome and anteriorly. 4. Substantial compressive atelectasis of the right lung. Multifocal indeterminate peripheral and nodular left lung opacities could be metastatic disease or infection. Electronically Signed   By: Genevie Ann M.D.   On: 03/13/2018 15:05    Assessment/Plan  This is a 84 y.o. female with a history of diabetes, hypertension, GERD, breast cancer now being admitted with:  #.  Pleural effusion likely recurrence of breast cancer Admit to inpatient Dr. Lindi Adie will see patient in a.m. for likely thoracentesis O2 and Med-Neb therapy as needed Pain control  #. H/o Diabetes - Accuchecks achs with RISS coverage - Heart healthy, carb controlled diet -N.p.o. after midnight -Hold metformin and glimepiride  #.  History of hypertension Continue lisinopril  #. History of hypothyroidism - Continue levothyroxine  Admission status: Inpatient IV Fluids: Hep-Lock Diet/Nutrition: Heart healthy, carb controlled Consults called: Dr. Berneta Sages DVT Px: Lovenox, SCDs and early ambulation. Code Status: DNR Disposition Plan: To home in 1-2 days  All the records  are reviewed and case discussed with ED provider. Management plans discussed with the patient and/or family who express understanding and agree with plan of care.  Rosalea Withrow D.O. on 03/13/2018 at 7:36 PM CC: Primary care physician; Hulan Fess, MD   03/13/2018, 7:36 PM

## 2018-03-14 ENCOUNTER — Inpatient Hospital Stay (HOSPITAL_COMMUNITY): Payer: Medicare HMO

## 2018-03-14 LAB — GLUCOSE, CAPILLARY
GLUCOSE-CAPILLARY: 115 mg/dL — AB (ref 65–99)
Glucose-Capillary: 100 mg/dL — ABNORMAL HIGH (ref 65–99)
Glucose-Capillary: 145 mg/dL — ABNORMAL HIGH (ref 65–99)
Glucose-Capillary: 156 mg/dL — ABNORMAL HIGH (ref 65–99)
Glucose-Capillary: 95 mg/dL (ref 65–99)

## 2018-03-14 LAB — CBC
HCT: 32 % — ABNORMAL LOW (ref 36.0–46.0)
Hemoglobin: 10.2 g/dL — ABNORMAL LOW (ref 12.0–15.0)
MCH: 27.6 pg (ref 26.0–34.0)
MCHC: 31.9 g/dL (ref 30.0–36.0)
MCV: 86.7 fL (ref 78.0–100.0)
PLATELETS: 385 10*3/uL (ref 150–400)
RBC: 3.69 MIL/uL — AB (ref 3.87–5.11)
RDW: 13.5 % (ref 11.5–15.5)
WBC: 4.4 10*3/uL (ref 4.0–10.5)

## 2018-03-14 LAB — BODY FLUID CELL COUNT WITH DIFFERENTIAL
Lymphs, Fluid: 92 %
Monocyte-Macrophage-Serous Fluid: 6 % — ABNORMAL LOW (ref 50–90)
Neutrophil Count, Fluid: 2 % (ref 0–25)
Total Nucleated Cell Count, Fluid: 1191 cu mm — ABNORMAL HIGH (ref 0–1000)

## 2018-03-14 LAB — BASIC METABOLIC PANEL
Anion gap: 9 (ref 5–15)
BUN: 18 mg/dL (ref 6–20)
CHLORIDE: 107 mmol/L (ref 101–111)
CO2: 26 mmol/L (ref 22–32)
CREATININE: 0.93 mg/dL (ref 0.44–1.00)
Calcium: 9.5 mg/dL (ref 8.9–10.3)
GFR calc Af Amer: >60 mL/min (ref >60)
GFR calc non Af Amer: 54 mL/min — ABNORMAL LOW (ref >60)
GLUCOSE: 97 mg/dL (ref 65–99)
Potassium: 4.2 mmol/L (ref 3.5–5.1)
Sodium: 142 mmol/L (ref 135–145)

## 2018-03-14 LAB — PROTEIN, PLEURAL OR PERITONEAL FLUID: TOTAL PROTEIN, FLUID: 5.1 g/dL

## 2018-03-14 LAB — LACTATE DEHYDROGENASE, PLEURAL OR PERITONEAL FLUID: LD FL: 148 U/L — AB (ref 3–23)

## 2018-03-14 LAB — GRAM STAIN

## 2018-03-14 LAB — LACTATE DEHYDROGENASE: LDH: 204 U/L — ABNORMAL HIGH (ref 98–192)

## 2018-03-14 LAB — PROTIME-INR
INR: 1.11
Prothrombin Time: 14.2 seconds (ref 11.4–15.2)

## 2018-03-14 MED ORDER — LIDOCAINE HCL 1 % IJ SOLN
INTRAMUSCULAR | Status: AC
  Administered 2018-03-14: 20:00:00
  Filled 2018-03-14: qty 10

## 2018-03-14 MED ORDER — INSULIN ASPART 100 UNIT/ML ~~LOC~~ SOLN
0.0000 [IU] | SUBCUTANEOUS | Status: DC
  Administered 2018-03-14: 1 [IU] via SUBCUTANEOUS
  Administered 2018-03-14 – 2018-03-15 (×3): 2 [IU] via SUBCUTANEOUS
  Administered 2018-03-15: 5 [IU] via SUBCUTANEOUS
  Administered 2018-03-15 – 2018-03-16 (×4): 2 [IU] via SUBCUTANEOUS

## 2018-03-14 NOTE — Progress Notes (Signed)
[facility] Progress Note  Patient: Brianna Valdez DXI:[id number]   PCP: Hulan Fess, MD DOB: 1931-02-14   DOA: 03/13/2018   DOS: 03/14/2018   Date of Service: the patient was seen and examined on 03/14/2018  Subjective: Continues to feel fatigued, short of breath, dry cough, no fever no chills.  No nausea no vomiting. Reports that she started noticing pain in her breast around Easter time and has lost 5 to 10 pounds in last 1 month.  Brief hospital course: Pt. with PMH of diabetes, hypertension, GERD, breast cancer  S/P neoadjuvant chemotherapy-right lumpectomy and adjuvant radiation therapy; admitted on 03/13/2018, presented with complaint of shortness of breath and fatigue, was found to have progressive cancer and new pleural effusion. Currently further plan is for thoracentesis and follow recommendation from oncology.  Assessment and Plan: #.  Pleural effusion likely recurrence of breast cancer Underwent thoracentesis, Gram stain negative for any active infection.  Fluid analysis is highly suggestive of exudative effusion mostly if malignant. Awaiting cytology report. Oncology consulted for further assistance.  Dr. Lindi Adie will see the patient O2 and Med-Neb therapy as needed Pain control  #. H/o Diabetes - Accuchecks achs with sliding coverage - Diet will be continued as regular due to patient's poor p.o. intake -Hold metformin and glimepiride  #.  History of hypertension Continue lisinopril  #. History of hypothyroidism - Continue levothyroxine  Diet: regular diet DVT Prophylaxis: subcutaneous Heparin Advance goals of care discussion: full code  Family Communication: family was present at bedside, at the time of interview. The pt provided permission to discuss medical plan with the family. Opportunity was given to ask question and all questions were answered satisfactorily.   Disposition:  Discharge to home.  Consultants: IR Oncology Procedures:  thoracentesis   Antibiotics: Anti-infectives (From admission, onward)   None       Objective: Physical Exam: Vitals:   03/14/18 0736 03/14/18 0953 03/14/18 1001 03/14/18 1414  BP:  (!) 143/69 (!) 178/95 (!) 128/95  Pulse:    94  Resp:    18  Temp:    98.1 F (36.7 C)  TempSrc:    Oral  SpO2: 96%   95%  Weight:      Height:       No intake or output data in the 24 hours ending 03/14/18 1546 Filed Weights   03/13/18 1228 03/13/18 2038  Weight: 66.7 kg (147 lb) 66 kg (145 lb 9.6 oz)   General: Alert, Awake and Oriented to Time, Place and Person. Appear in mild distress, affect flat Eyes: PERRL, Conjunctiva normal ENT: Oral Mucosa clear moist Neck: no JVD, no Abnormal Mass Or lumps Cardiovascular: S1 and S2 Present, no Murmur, Peripheral Pulses Present Respiratory: normal respiratory effort, Bilateral Air entry equal and Decreased, no use of accessory muscle, right base Crackles, no wheezes Abdomen: Bowel Sound present, Soft and no tenderness, no hernia Skin: no redness, no Rash, no induration Extremities: no Pedal edema, no calf tenderness Neurologic: Grossly no focal neuro deficit. Bilaterally Equal motor strength  Data Reviewed: CBC: Recent Labs  Lab 03/13/18 1319 03/14/18 0328  WBC 5.1 4.4  NEUTROABS 3.8  --   HGB 11.1* 10.2*  HCT 33.7* 32.0*  MCV 84.3 86.7  PLT 391 767   Basic Metabolic Panel: Recent Labs  Lab 03/13/18 1319 03/14/18 0328  NA 137 142  K 4.6 4.2  CL 103 107  CO2 23 26  GLUCOSE 371* 97  BUN 24* 18  CREATININE 1.16* 0.93  CALCIUM 9.3  9.5    Liver Function Tests: Recent Labs  Lab 03/13/18 1319  AST 29  ALT 14  ALKPHOS 87  BILITOT 0.6  PROT 7.9  ALBUMIN 3.6   No results for input(s): LIPASE, AMYLASE in the last 168 hours. No results for input(s): AMMONIA in the last 168 hours. Coagulation Profile: Recent Labs  Lab 03/14/18 0328  INR 1.11   Cardiac Enzymes: Recent Labs  Lab 03/13/18 1319  TROPONINI <0.03   BNP (last  3 results) No results for input(s): PROBNP in the last 8760 hours. CBG: Recent Labs  Lab 03/13/18 2040 03/14/18 0050 03/14/18 0446 03/14/18 0729 03/14/18 1130  GLUCAP 256* 100* 95 115* 145*   Studies: Dg Chest 1 View  Result Date: 03/14/2018 CLINICAL DATA:  Status post right thoracentesis EXAM: CHEST  1 VIEW COMPARISON:  03/13/2018 FINDINGS: Cardiac shadow is stable. Aortic calcifications are again seen. The left lung is well aerated without focal infiltrate. The previously seen right-sided effusion has decreased significantly following thoracentesis. No pneumothorax is noted. Persistent lower lobe collapse is noted. IMPRESSION: No evidence of pneumothorax following thoracentesis. Electronically Signed   By: Inez Catalina M.D.   On: 03/14/2018 10:28   US Thoracentesis Asp Pleural Space W/img Guide  Result Date: 03/14/2018 INDICATION: History of breast cancer. Recent upper respiratory infection. Persistent cough. Right pleural effusion. Request for diagnostic and therapeutic thoracentesis. EXAM: ULTRASOUND GUIDED RIGHT THORACENTESIS MEDICATIONS: 1% Lidocaine = 10 mL COMPLICATIONS: None immediate. PROCEDURE: An ultrasound guided thoracentesis was thoroughly discussed with the patient and questions answered. The benefits, risks, alternatives and complications were also discussed. The patient understands and wishes to proceed with the procedure. Written consent was obtained. Ultrasound was performed to localize and mark an adequate pocket of fluid in the right chest. The area was then prepped and draped in the normal sterile fashion. 1% Lidocaine was used for local anesthesia. Under ultrasound guidance a 6 Fr Safe-T-Centesis catheter was introduced. Thoracentesis was performed. The catheter was removed and a dressing applied. FINDINGS: A total of approximately 1.28 liters of hazy gold fluid was removed. Samples were sent to the laboratory as requested by the clinical team. IMPRESSION: Successful  ultrasound guided right thoracentesis yielding 1.28 liters of pleural fluid. No pneumothorax on post procedure chest X-ray. Read by: Gareth Eagle, PA-C Electronically Signed   By: Corrie Mckusick D.O.   On: 03/14/2018 10:46    Scheduled Meds: . acidophilus   Oral Q supper  . brimonidine  1 drop Both Eyes BID  . enoxaparin (LOVENOX) injection  40 mg Subcutaneous Q24H  . insulin aspart  0-9 Units Subcutaneous Q4H  . ipratropium-albuterol  3 mL Nebulization BID  . levothyroxine  50 mcg Oral QAC breakfast  . lidocaine      . lisinopril  20 mg Oral Daily  . magnesium oxide  200 mg Oral QAC supper  . multivitamin  1 tablet Oral Daily  . multivitamin with minerals  1 tablet Oral Daily   Continuous Infusions: PRN Meds: acetaminophen **OR** acetaminophen, bisacodyl, ipratropium-albuterol, magnesium citrate, ondansetron **OR** ondansetron (ZOFRAN) IV, oxyCODONE, senna-docusate  Time spent: 35 minutes  Author: Berle Mull, MD [facility] Pager: [phone] 03/14/2018 3:46 PM  If 7PM-7AM, please contact night-coverage at [email or web address], password [facility]

## 2018-03-14 NOTE — Procedures (Signed)
PROCEDURE SUMMARY:  Successful US guided right thoracentesis. Yielded 1.28 liters of hazy gold fluid. Patient tolerated procedure well. No immediate complications.  Specimen was sent for labs.  Post procedure chest X-ray reveals no pneumothorax  WENDY S BLAIR PA-C 03/14/2018 10:46 AM

## 2018-03-14 NOTE — Evaluation (Addendum)
Physical Therapy Evaluation Patient Details Name: Brianna Valdez MRN: [id number] DOB: [birth year] Today's Date: 03/14/2018   History of Present Illness  84 y.o. female with a known history of diabetes, hypertension, GERD, breast cancer presents to the emergency department for evaluation of shortness of breath. Dx of pleural effusion, likely cancer reoccurrence.  Clinical Impression  Pt admitted with above diagnosis. Pt currently with functional limitations due to the deficits listed below (see PT Problem List). Pt ambulated 160' with RW, no loss of balance, SaO2 93% on room air walking. Pt was encouraged to use RW at home as she's had a couple falls in the past 1 year.  Pt will benefit from skilled PT to increase their independence and safety with mobility to allow discharge to the venue listed below.       Follow Up Recommendations No PT follow up    Equipment Recommendations  None recommended by PT    Recommendations for Other Services       Precautions / Restrictions Precautions Precautions: Fall Precaution Comments: pt reports " a couple" falls in past 1 year Restrictions Weight Bearing Restrictions: No      Mobility  Bed Mobility Overal bed mobility: Modified Independent             General bed mobility comments: HOB up 20*  Transfers Overall transfer level: Needs assistance Equipment used: Rolling walker (2 wheeled) Transfers: Sit to/from Stand Sit to Stand: Min guard         General transfer comment: increased time, no physical assist needed  Ambulation/Gait Ambulation/Gait assistance: Supervision Ambulation Distance (Feet): 160 Feet Assistive device: Rolling walker (2 wheeled) Gait Pattern/deviations: WFL(Within Functional Limits) Gait velocity: WFL   General Gait Details: steady with RW, no loss of balance, SaO2 93% on room air walking, no dyspnea  Stairs            Wheelchair Mobility    Modified Rankin (Stroke Patients Only)        Balance Overall balance assessment: History of Falls;Needs assistance   Sitting balance-Leahy Scale: Normal       Standing balance-Leahy Scale: Fair                               Pertinent Vitals/Pain Pain Assessment: No/denies pain    Home Living Family/patient expects to be discharged to:: Private residence Living Arrangements: Spouse/significant other Available Help at Discharge: Family;Available 24 hours/day Type of Home: House Home Access: Stairs to enter Entrance Stairs-Rails: Psychiatric nurse of Steps: 5 Home Layout: One level Home Equipment: Walker - 2 wheels;Cane - single point;Bedside commode;Shower seat      Prior Function Level of Independence: Independent               Hand Dominance        Extremity/Trunk Assessment   Upper Extremity Assessment Upper Extremity Assessment: Overall WFL for tasks assessed    Lower Extremity Assessment Lower Extremity Assessment: Overall WFL for tasks assessed    Cervical / Trunk Assessment Cervical / Trunk Assessment: Normal  Communication   Communication: No difficulties  Cognition Arousal/Alertness: Awake/alert Behavior During Therapy: WFL for tasks assessed/performed Overall Cognitive Status: Within Functional Limits for tasks assessed                                        General Comments  Exercises     Assessment/Plan    PT Assessment Patient needs continued PT services  PT Problem List Decreased mobility;Decreased balance       PT Treatment Interventions DME instruction;Gait training;Therapeutic activities;Therapeutic exercise;Patient/family education    PT Goals (Current goals can be found in the Care Plan section)  Acute Rehab PT Goals Patient Stated Goal: return to [facility] PT Goal Formulation: With patient/family Time For Goal Achievement: 03/28/18 Potential to Achieve Goals: Good    Frequency Min 3X/week   Barriers to  discharge        Co-evaluation               AM-PAC PT "6 Clicks" Daily Activity  Outcome Measure Difficulty turning over in bed (including adjusting bedclothes, sheets and blankets)?: None Difficulty moving from lying on back to sitting on the side of the bed? : A Little Difficulty sitting down on and standing up from a chair with arms (e.g., wheelchair, bedside commode, etc,.)?: A Little Help needed moving to and from a bed to chair (including a wheelchair)?: A Little Help needed walking in hospital room?: A Little Help needed climbing 3-5 steps with a railing? : A Little 6 Click Score: 19    End of Session Equipment Utilized During Treatment: Gait belt Activity Tolerance: Patient tolerated treatment well Patient left: in chair;with call bell/phone within reach;with family/visitor present Nurse Communication: Mobility status      Time: 9201-0071 PT Time Calculation (min) (ACUTE ONLY): 29 min   Charges:   PT Evaluation $PT Eval Low Complexity: 1 Low PT Treatments $Gait Training: 8-22 mins   PT G Codes:          Philomena Doheny 03/14/2018, 12:26 PM [phone]

## 2018-03-15 ENCOUNTER — Inpatient Hospital Stay (HOSPITAL_COMMUNITY): Payer: Medicare HMO

## 2018-03-15 DIAGNOSIS — J90 Pleural effusion, not elsewhere classified: Secondary | ICD-10-CM

## 2018-03-15 DIAGNOSIS — E039 Hypothyroidism, unspecified: Secondary | ICD-10-CM

## 2018-03-15 DIAGNOSIS — C50919 Malignant neoplasm of unspecified site of unspecified female breast: Secondary | ICD-10-CM

## 2018-03-15 DIAGNOSIS — C7802 Secondary malignant neoplasm of left lung: Secondary | ICD-10-CM

## 2018-03-15 DIAGNOSIS — C787 Secondary malignant neoplasm of liver and intrahepatic bile duct: Secondary | ICD-10-CM

## 2018-03-15 DIAGNOSIS — Z6827 Body mass index (BMI) 27.0-27.9, adult: Secondary | ICD-10-CM

## 2018-03-15 DIAGNOSIS — C7801 Secondary malignant neoplasm of right lung: Secondary | ICD-10-CM

## 2018-03-15 DIAGNOSIS — I10 Essential (primary) hypertension: Secondary | ICD-10-CM

## 2018-03-15 DIAGNOSIS — R634 Abnormal weight loss: Secondary | ICD-10-CM

## 2018-03-15 DIAGNOSIS — Z9221 Personal history of antineoplastic chemotherapy: Secondary | ICD-10-CM

## 2018-03-15 DIAGNOSIS — C50211 Malignant neoplasm of upper-inner quadrant of right female breast: Secondary | ICD-10-CM

## 2018-03-15 DIAGNOSIS — Z923 Personal history of irradiation: Secondary | ICD-10-CM

## 2018-03-15 LAB — GLUCOSE, CAPILLARY
GLUCOSE-CAPILLARY: 156 mg/dL — AB (ref 65–99)
GLUCOSE-CAPILLARY: 192 mg/dL — AB (ref 65–99)
Glucose-Capillary: 179 mg/dL — ABNORMAL HIGH (ref 65–99)
Glucose-Capillary: 292 mg/dL — ABNORMAL HIGH (ref 65–99)

## 2018-03-15 MED ORDER — ENOXAPARIN SODIUM 40 MG/0.4ML ~~LOC~~ SOLN: 40.0000 mg | SUBCUTANEOUS | Status: DC

## 2018-03-15 NOTE — Progress Notes (Addendum)
HEMATOLOGY-ONCOLOGY PROGRESS NOTE  SUBJECTIVE: Patient with triple negative right breast cancer treated with Taxol carboplatin followed by lumpectomy and radiation and was on surveillance.  She presented with metastatic disease involving lungs and liver.  There is a very large right pleural effusion and a very large tumor that appears to be eroding through the ribs and the breastbone into the chest wall in the breast.  She had thoracentesis yesterday with marked improvement in shortness of breath.  Surprisingly she only has mild to moderate discomfort in the breast and the chest wall.  She did require narcotic pain medication.  She has gotten significantly weaker and lost about 35 pounds recently.  OBJECTIVE: REVIEW OF SYSTEMS:   Constitutional: Generalized fatigue and weakness and weight loss Eyes: Denies blurriness of vision Ears, nose, mouth, throat, and face: Denies mucositis or sore throat Respiratory: Cough and shortness of breath Cardiovascular: Denies palpitation, chest discomfort Gastrointestinal:  Denies nausea, heartburn or change in bowel habits Skin: Denies abnormal skin rashes Lymphatics: Denies new lymphadenopathy or easy bruising Neurological:Denies numbness, tingling or new weaknesses Behavioral/Psych: Mood is stable, no new changes  Extremities: No lower extremity edema Breast: Pain in the right breast All other systems were reviewed with the patient and are negative.  I have reviewed the past medical history, past surgical history, social history and family history with the patient and they are unchanged from previous note.    Breast cancer of upper-inner quadrant of right female breast ([place])   10/22/2014 Mammogram    Highly suspicious 2.6 cm mass in the upper inner quadrant of the right breast at the 2 o'clock , Pathologic right axillary lymph nodes, including a node in the mid axillary line with eccentric cortical thickening up to 5 mm      11/01/2014 Initial Biopsy   Right breast biopsy 2:00: IDC with DCIS, ER/PR 0%, Ki-67 39%, HER-2 negative; right axillary lymph node biopsy: Positive for IDC ER/PR 0%, 43%, HER-2 Negative Ratio 0.94      11/15/2014 Breast MRI    Right breast: total extent of disease in upper inner portion including adjacent satellite nodules / adjacent linear non mass enhancement measuring up to 4.8 cm. Close to pectoralis. Lymph nodes with mild irregularity/thickened cortices in the right axilla      11/15/2014 Clinical Stage    Stage IIB: T2 N1      12/10/2014 - 02/25/2015 Neo-Adjuvant Chemotherapy    Weekly paclitaxel and carboplatin 12      03/04/2015 Breast MRI    Interval decrease in size of enhancing mass within the upper inner right breast posterior depth. Adjacent nodules and non mass enhancement are grossly similar when compared to prior exam      04/08/2015 Definitive Surgery    Right lumpectomy/[facility]): Multifocal invasive ductal carcinoma 2 foci 2.5 cm, 0.5 cm with DCIS, margins negative closest 0.1 cm, 1/33 lymph nodes positive       04/08/2015 Pathologic Stage    Stage IIB: ypT2(m) ypN1a      05/19/2015 - 07/03/2015 Radiation Therapy    Adjuvant RT [facility]): Right breast / 45 Gray @ 1.8 Pearline Cables per fraction x 25 fractions; Right SCLV/PAB / 45 Gy at 1.6v Gy per fraction x 25 fractions; Right breast boost / 16 Gray at [employer] per fraction x 8 fractions. Total dose: 61 Gy.      09/18/2015 Survivorship    Survivorship visit completed and copy of care plan given to patient.       PHYSICAL EXAMINATION:  ECOG PERFORMANCE STATUS: 3 - Symptomatic, >50% confined to bed  Vitals:   03/14/18 2220 03/15/18 0541  BP:  (!) 139/59  Pulse:  88  Resp:  15  Temp:  98.2 F (36.8 C)  SpO2: 94% 92%   Filed Weights   03/13/18 1228 03/13/18 2038  Weight: 147 lb (66.7 kg) 145 lb 9.6 oz (66 kg)    GENERAL:alert, no distress and comfortable SKIN: skin color, texture, turgor are normal, no rashes or significant lesions EYES:  normal, Conjunctiva are pink and non-injected, sclera clear OROPHARYNX:no exudate, no erythema and lips, buccal mucosa, and tongue normal  NECK: supple, thyroid normal size, non-tender, without nodularity LYMPH:  no palpable lymphadenopathy in the cervical, axillary or inguinal LUNGS: Decreased breath sounds in the right HEART: regular rate & rhythm and no murmurs and no lower extremity edema ABDOMEN:abdomen soft, non-tender and normal bowel sounds Musculoskeletal:no cyanosis of digits and no clubbing  NEURO: alert & oriented x 3 with fluent speech, no focal motor/sensory deficits  LABORATORY DATA:  I have reviewed the data as listed CMP Latest Ref Rng & Units 03/14/2018 03/13/2018 04/09/2015  Glucose 65 - 99 mg/dL 97 371(H) 104(H)  BUN 6 - 20 mg/dL 18 24(H) 12  Creatinine 0.44 - 1.00 mg/dL 0.93 1.16(H) 0.99  Sodium 135 - 145 mmol/L 142 137 143  Potassium 3.5 - 5.1 mmol/L 4.2 4.6 4.6  Chloride 101 - 111 mmol/L 107 103 108  CO2 22 - 32 mmol/L '26 23 27  '$ Calcium 8.9 - 10.3 mg/dL 9.5 9.3 8.8(L)  Total Protein 6.5 - 8.1 g/dL - 7.9 -  Total Bilirubin 0.3 - 1.2 mg/dL - 0.6 -  Alkaline Phos 38 - 126 U/L - 87 -  AST 15 - 41 U/L - 29 -  ALT 14 - 54 U/L - 14 -    Lab Results  Component Value Date   WBC 4.4 03/14/2018   HGB 10.2 (L) 03/14/2018   HCT 32.0 (L) 03/14/2018   MCV 86.7 03/14/2018   PLT 385 03/14/2018   NEUTROABS 3.8 03/13/2018    ASSESSMENT AND PLAN: 1.  Metastatic breast cancer: I did call pathology and asked him to run breast prognostic panel as well as foundation one testing and PD1 testing.  If there is not enough cells to run a foundation one or PD1 then we will send it on the peripheral blood using guardant.  I discussed with the patient that triple negative metastatic disease comes with extremely poor prognosis.  If she does not have any mutations then the only option of treatment would be chemotherapy.  She is not interested in chemotherapy and I do not think she will  tolerate it either.  I will see the patient back in a couple of weeks and if she does not have any mutations then we will proceed with hospice care.  In the meantime please schedule her with home health for strengthening. If her pleural effusion gets worse then I instructed her to call us so we can set up for an outpatient thoracentesis. I discussed with the family that if her symptoms get markedly worse to the point that she is bedridden then we do not have to bring her in for follow-ups and then we can certainly get her hospice care at home.  I anticipate that she has short life expectancy if nothing further is done. Patient and family appear to understand this completely.  Patient is a very realistic and had clearly expressed that she does not  want to be put through drastic treatments but she was willing to take a tablet if it was going to be palliative in intent.  Goals of care: Palliation Please discharge her home Thank you for your care

## 2018-03-15 NOTE — Progress Notes (Signed)
PROGRESS NOTE    Brianna Valdez  BHA:[id number] DOB: 1930/11/13 DOA: 03/13/2018 PCP: Hulan Fess, MD   Brief Narrative: Brianna Valdez is a 84 y.o. female with a history of GERD, breast cance status post neoadjuvant chemotherapy and rightr lumpectomy with adjuvant radiotherapy, hypertension, diabetes mellitus.  Patient presented secondary to progressive fatigue and dyspnea and found to have a pleural effusion.  She underwent thoracentesis which was concerning for an exudative process likely related to recurrent breast cancer.  CT chest was significant for chest wall mass.  Patient admitted with initial improvement after thoracentesis and is now having recurrent dyspnea.  Plan for Pleurx catheter placement on 6/13.  Plan for home with hospice on discharge   Assessment & Plan:   Principal Problem:   Pleural effusion Active Problems:   Metastatic breast cancer ([place])   Hypothyroidism   Essential hypertension   Pleural effusion Likely malignant. Pleural fluid is exudative. Pathology pending. Oncology involved. Patient with recurrent symptoms today and chest x-ray significant for some mild progressive pleural effusion. -IR consult for PleurX  Diabetes mellitus, type 2 Patient on metformin and glimepiride as an outpatient -Continue SSI  Hypertension -Continue lisinopril  Hypothyroidism -Continue Synthroid   DVT prophylaxis: Lovenox full code Code Status:   Code Status: Full Code Family Communication: Daughter and great-niece at bedside in addition to multiple family members that were asked to step outside Disposition Plan: Discharge likely in 1-2 days   Consultants:   Oncology  Interventional radiology  Procedures:   Thoracentesis (6/11)  Antimicrobials:  None    Subjective: Worsened dyspnea today. Having trouble catching her breath while talking.  Objective: Vitals:   03/14/18 2220 03/15/18 0541 03/15/18 1123 03/15/18 1415  BP:  (!) 139/59   139/65  Pulse:  88  92  Resp:  15  18  Temp:  98.2 F (36.8 C)  97.8 F (36.6 C)  TempSrc:  Oral  Oral  SpO2: 94% 92% 92% 95%  Weight:      Height:       No intake or output data in the 24 hours ending 03/15/18 1545 Filed Weights   03/13/18 1228 03/13/18 2038  Weight: 66.7 kg (147 lb) 66 kg (145 lb 9.6 oz)    Examination:  General exam: Appears calm and comfortable Respiratory system:  decreased breath sounds worse on right compared to left with associated rales up to her mid lung on the right side.  No wheezing or rhonchi.  No accessory muscle usage.  Cardiovascular system: S1 & S2 heard, RRR. 2/6 systolic murmur Gastrointestinal system: Abdomen is nondistended, soft and nontender.  Normal bowel sounds heard. Central nervous system: Alert and oriented. No focal neurological deficits. Extremities: No edema. No calf tenderness Skin: No cyanosis. No rashes Psychiatry: Judgement and insight appear normal. Mood & affect appropriate.     Data Reviewed: I have personally reviewed following labs and imaging studies  CBC: Recent Labs  Lab 03/13/18 1319 03/14/18 0328  WBC 5.1 4.4  NEUTROABS 3.8  --   HGB 11.1* 10.2*  HCT 33.7* 32.0*  MCV 84.3 86.7  PLT 391 973   Basic Metabolic Panel: Recent Labs  Lab 03/13/18 1319 03/14/18 0328  NA 137 142  K 4.6 4.2  CL 103 107  CO2 23 26  GLUCOSE 371* 97  BUN 24* 18  CREATININE 1.16* 0.93  CALCIUM 9.3 9.5   GFR: Estimated Creatinine Clearance: 38.5 mL/min (by C-G formula based on SCr of 0.93 mg/dL). Liver Function Tests: Recent Labs  Lab 03/13/18 1319  AST 29  ALT 14  ALKPHOS 87  BILITOT 0.6  PROT 7.9  ALBUMIN 3.6   No results for input(s): LIPASE, AMYLASE in the last 168 hours. No results for input(s): AMMONIA in the last 168 hours. Coagulation Profile: Recent Labs  Lab 03/14/18 0328  INR 1.11   Cardiac Enzymes: Recent Labs  Lab 03/13/18 1319  TROPONINI <0.03   CBG: Recent Labs  Lab 03/14/18 0729  03/14/18 1130 03/14/18 1649 03/15/18 0908 03/15/18 1132  GLUCAP 115* 145* 156* 156* 192*    Recent Results (from the past 240 hour(s))  Culture, body fluid-bottle     Status: None (Preliminary result)   Collection Time: 03/14/18  9:57 AM  Result Value Ref Range Status   Specimen Description PLEURAL RIGHT  Final   Special Requests NONE  Final   Culture   Final    NO GROWTH 1 DAY Performed at [facility], [place] [address], [place], [place] [address]    Report Status PENDING  Incomplete  Gram stain     Status: None   Collection Time: 03/14/18  9:57 AM  Result Value Ref Range Status   Specimen Description PLEURAL RIGHT  Final   Special Requests NONE  Final   Gram Stain   Final    RARE WBC PRESENT, PREDOMINANTLY MONONUCLEAR NO ORGANISMS SEEN Performed at [facility], [address], [place], [place] [address]    Report Status 03/14/2018 FINAL  Final         Radiology Studies: Dg Chest 1 View  Result Date: 03/14/2018 CLINICAL DATA:  Status post right thoracentesis EXAM: CHEST  1 VIEW COMPARISON:  03/13/2018 FINDINGS: Cardiac shadow is stable. Aortic calcifications are again seen. The left lung is well aerated without focal infiltrate. The previously seen right-sided effusion has decreased significantly following thoracentesis. No pneumothorax is noted. Persistent lower lobe collapse is noted. IMPRESSION: No evidence of pneumothorax following thoracentesis. Electronically Signed   By: Inez Catalina M.D.   On: 03/14/2018 10:28   Dg Chest Port 1 View  Result Date: 03/15/2018 CLINICAL DATA:  Pleural effusion EXAM: PORTABLE CHEST 1 VIEW COMPARISON:  03/14/2017 FINDINGS: Progression of extensive right lower lobe airspace disease. Mild progression of right pleural effusion. Left lung is clear without infiltrate or effusion. Negative for heart failure or edema. IMPRESSION: Progressive right lower lobe airspace disease and right pleural effusion. Electronically Signed   By:  Franchot Gallo M.D.   On: 03/15/2018 14:48   US Thoracentesis Asp Pleural Space W/img Guide  Result Date: 03/14/2018 INDICATION: History of breast cancer. Recent upper respiratory infection. Persistent cough. Right pleural effusion. Request for diagnostic and therapeutic thoracentesis. EXAM: ULTRASOUND GUIDED RIGHT THORACENTESIS MEDICATIONS: 1% Lidocaine = 10 mL COMPLICATIONS: None immediate. PROCEDURE: An ultrasound guided thoracentesis was thoroughly discussed with the patient and questions answered. The benefits, risks, alternatives and complications were also discussed. The patient understands and wishes to proceed with the procedure. Written consent was obtained. Ultrasound was performed to localize and mark an adequate pocket of fluid in the right chest. The area was then prepped and draped in the normal sterile fashion. 1% Lidocaine was used for local anesthesia. Under ultrasound guidance a 6 Fr Safe-T-Centesis catheter was introduced. Thoracentesis was performed. The catheter was removed and a dressing applied. FINDINGS: A total of approximately 1.28 liters of hazy gold fluid was removed. Samples were sent to the laboratory as requested by the clinical team. IMPRESSION: Successful ultrasound guided right thoracentesis yielding  1.28 liters of pleural fluid. No pneumothorax on post procedure chest X-ray. Read by: Gareth Eagle, PA-C Electronically Signed   By: Corrie Mckusick D.O.   On: 03/14/2018 10:46        Scheduled Meds: . acidophilus   Oral Q supper  . brimonidine  1 drop Both Eyes BID  . enoxaparin (LOVENOX) injection  40 mg Subcutaneous Q24H  . insulin aspart  0-9 Units Subcutaneous Q4H  . ipratropium-albuterol  3 mL Nebulization BID  . levothyroxine  50 mcg Oral QAC breakfast  . lisinopril  20 mg Oral Daily  . magnesium oxide  200 mg Oral QAC supper  . multivitamin  1 tablet Oral Daily  . multivitamin with minerals  1 tablet Oral Daily   Continuous Infusions:   LOS: 2 days      Cordelia Poche, MD [facility] 03/15/2018, 3:45 PM Pager: [phone]  If 7PM-7AM, please contact night-coverage [email or web address] 03/15/2018, 3:45 PM

## 2018-03-16 ENCOUNTER — Encounter (HOSPITAL_COMMUNITY): Payer: Self-pay | Admitting: Interventional Radiology

## 2018-03-16 ENCOUNTER — Telehealth: Payer: Self-pay

## 2018-03-16 ENCOUNTER — Inpatient Hospital Stay (HOSPITAL_COMMUNITY): Payer: Medicare HMO

## 2018-03-16 HISTORY — PX: IR GUIDED DRAIN W CATHETER PLACEMENT: IMG719

## 2018-03-16 LAB — GLUCOSE, CAPILLARY
GLUCOSE-CAPILLARY: 152 mg/dL — AB (ref 65–99)
Glucose-Capillary: 172 mg/dL — ABNORMAL HIGH (ref 65–99)
Glucose-Capillary: 173 mg/dL — ABNORMAL HIGH (ref 65–99)

## 2018-03-16 MED ORDER — MIDAZOLAM HCL 2 MG/2ML IJ SOLN
INTRAMUSCULAR | Status: AC
  Filled 2018-03-16: qty 4

## 2018-03-16 MED ORDER — MIDAZOLAM HCL 2 MG/2ML IJ SOLN
INTRAMUSCULAR | Status: AC | PRN
  Administered 2018-03-16: 0.5 mg via INTRAVENOUS
  Administered 2018-03-16: 1 mg via INTRAVENOUS

## 2018-03-16 MED ORDER — FLUMAZENIL 0.5 MG/5ML IV SOLN
INTRAVENOUS | Status: AC
  Filled 2018-03-16: qty 5

## 2018-03-16 MED ORDER — FENTANYL CITRATE (PF) 100 MCG/2ML IJ SOLN
INTRAMUSCULAR | Status: AC | PRN
  Administered 2018-03-16: 25 ug via INTRAVENOUS
  Administered 2018-03-16: 50 ug via INTRAVENOUS

## 2018-03-16 MED ORDER — LIDOCAINE-EPINEPHRINE (PF) 2 %-1:200000 IJ SOLN
INTRAMUSCULAR | Status: AC | PRN
  Administered 2018-03-16: 10 mL

## 2018-03-16 MED ORDER — FENTANYL CITRATE (PF) 100 MCG/2ML IJ SOLN
INTRAMUSCULAR | Status: AC
  Filled 2018-03-16: qty 4

## 2018-03-16 MED ORDER — LIDOCAINE-EPINEPHRINE (PF) 2 %-1:200000 IJ SOLN
INTRAMUSCULAR | Status: AC
  Filled 2018-03-16: qty 20

## 2018-03-16 MED ORDER — CEFAZOLIN SODIUM-DEXTROSE 2-4 GM/100ML-% IV SOLN
INTRAVENOUS | Status: AC
  Administered 2018-03-16: 2000 mg
  Filled 2018-03-16: qty 100

## 2018-03-16 MED ORDER — CEFAZOLIN SODIUM-DEXTROSE 2-4 GM/100ML-% IV SOLN: 2.0000 g | Freq: Once | INTRAVENOUS | Status: AC

## 2018-03-16 MED ORDER — NALOXONE HCL 0.4 MG/ML IJ SOLN
INTRAMUSCULAR | Status: AC
  Filled 2018-03-16: qty 1

## 2018-03-16 NOTE — Discharge Summary (Signed)
Physician Discharge Summary  Brianna Valdez WUJ:[id number] DOB: [birth year] DOA: 03/13/2018  PCP: Hulan Fess, MD  Admit date: 03/13/2018 Discharge date: 03/16/2018  Admitted From: Home Disposition: Home with hospice  Recommendations for Outpatient Follow-up:  1. Follow up with oncology in 1 week 2. Please follow up on the following pending results: None  Home Health: Home hospice Equipment/Devices: Pleurx catheter  Discharge Condition: Stable CODE STATUS: Full code Diet recommendation: Carb modified   Brief/Interim Summary:  Admission HPI written by Harvie Bridge, DO   HPI: Brianna Valdez is a 84 y.o. female with a known history of diabetes, hypertension, GERD, breast cancer presents to the emergency department for evaluation of ornis of breath.  Patient was in a usual state of health until several weeks ago when she developed the upper respiratory tract infection which mostly resolved but then recurred and is now associated with slight cough, shortness of breath and dyspnea on exertion.  On my exam patient states that she feels significantly better, denies shortness of breath or chest pain.  No cough at this time.  Patient denies fevers/chills, weakness, dizziness, chest pain, shortness of breath, N/V/C/D, abdominal pain, dysuria/frequency, changes in mental status.     EMS/ED Course:Medical admission has been requested for further management of pleural effusion, probable breast cancer recurrence.  Of note patient is reportedly cancer free as of July 2018 but was due for follow-up mammogram.   Hospital course:  Pleural effusion Likely malignant. Pleural fluid is exudative. Pathology pending. Oncology involved.  Patient underwent thoracentesis on 6/11 with 1.8 L aspirated.  She had worsening symptoms the next day and after discussion with patient, family, interventional radiology, decided to have Pleurx placed.  Since symptoms and findings are concerning  for recurrent cancer, patient and family have requested hospice involvement.  Patient set up for home with hospice.  Outpatient follow-up with patient's oncologist for lab results. Cytology concerning for malignancy.   Diabetes mellitus, type 2 Patient on metformin and glimepiride as an outpatient. Sliding scale insulin while inpatient. Recommend discontinuing glimepiride on discharge. Consider repeat hemoglobin A1C.  Hypertension Continued lisinopril  Hypothyroidism Continued Synthroid   Discharge Diagnoses:  Principal Problem:   Pleural effusion Active Problems:   Metastatic breast cancer ([place])   Hypothyroidism   Essential hypertension    Discharge Instructions   Allergies as of 03/16/2018   No Known Allergies     Medication List    STOP taking these medications   glimepiride 1 MG tablet Commonly known as:  AMARYL     TAKE these medications   brimonidine 0.15 % ophthalmic solution Commonly known as:  ALPHAGAN Place 1 drop into both eyes 2 (two) times daily.   levothyroxine 50 MCG tablet Commonly known as:  SYNTHROID, LEVOTHROID Take 50 mcg by mouth daily before breakfast.   lisinopril 20 MG tablet Commonly known as:  PRINIVIL,ZESTRIL Take 1 tablet (20 mg total) by mouth daily.   LUTEIN PO Take 25 mg by mouth daily.   Magnesium 250 MG Tabs Take 250 mg by mouth daily with supper.   metFORMIN 500 MG tablet Commonly known as:  GLUCOPHAGE Take 500-1,000 mg by mouth 2 (two) times daily with a meal. Take 2 tablets (1000 mg) with breakfast and 1 tablet (500 mg) with supper   multivitamin with minerals Tabs tablet Take by mouth 2 (two) times daily. 1 packet of Daily Advantage Vitamins twice daily (minus the fish oil capsule)   OSTEO BI-FLEX JOINT SHIELD PO Take 1 tablet by mouth  daily with supper.   OVER THE COUNTER MEDICATION Take 1 tablet by mouth daily with breakfast. Blood Builder   OVER THE COUNTER MEDICATION Take 1 tablet by mouth daily with  breakfast. Neuro Support Plus   OVER THE COUNTER MEDICATION Sun chlorella Takes 4-6 tabs every morning after breakfast   PROBIOTIC DAILY PO Take 1 tablet by mouth daily with supper. Probiotic Advantage Ultra 20   UNABLE TO FIND 1 each by Other route as needed. Dispense per medical necessity class 1 compression sleeve 20-25mm/Hg and gauntlet for hx of breast cancer s/p surgery      Follow-up Information    [facility] Follow up.   Contact information: [address] [place] [address] [phone]        Nicholas Lose, MD Follow up.   Specialty:  Hematology and Oncology Why:  Lab results Contact information: [place] [id number] [phone]          No Known Allergies  Consultations:  Oncology  Interventional radiology   Procedures/Studies: Dg Chest 1 View  Result Date: 03/14/2018 CLINICAL DATA:  Status post right thoracentesis EXAM: CHEST  1 VIEW COMPARISON:  03/13/2018 FINDINGS: Cardiac shadow is stable. Aortic calcifications are again seen. The left lung is well aerated without focal infiltrate. The previously seen right-sided effusion has decreased significantly following thoracentesis. No pneumothorax is noted. Persistent lower lobe collapse is noted. IMPRESSION: No evidence of pneumothorax following thoracentesis. Electronically Signed   By: Inez Catalina M.D.   On: 03/14/2018 10:28   Dg Chest 2 View  Result Date: 03/13/2018 CLINICAL DATA:  COPD.  Cough. EXAM: CHEST - 2 VIEW COMPARISON:  11/18/2016 FINDINGS: There is a moderate right pleural effusion. There is right lower lobe airspace disease which may reflect atelectasis versus pneumonia. There is bilateral interstitial prominence likely secondary to low lung volumes. There is no pneumothorax. There is no left pleural effusion. The heart and mediastinal contours are unremarkable. There is thoracic aortic atherosclerosis. There is no acute osseous abnormality.  IMPRESSION: 1. Moderate right pleural effusion. Right lower lobe airspace disease which may reflect atelectasis versus pneumonia. Electronically Signed   By: Kathreen Devoid   On: 03/13/2018 13:06   Ct Chest W Contrast  Result Date: 03/13/2018 CLINICAL DATA:  84 year old female with 1 week of productive cough. Right pleural effusion identified on chest radiographs today. Personal history of right breast cancer, 2016. EXAM: CT CHEST WITH CONTRAST TECHNIQUE: Multidetector CT imaging of the chest was performed during intravenous contrast administration. CONTRAST:  18mL ISOVUE-300 IOPAMIDOL (ISOVUE-300) INJECTION 61% COMPARISON:  Chest radiographs 1247 hours today, and earlier. PET-CT 11/29/2014. FINDINGS: Cardiovascular: Calcified aortic atherosclerosis. Calcified coronary artery atherosclerosis and/or stents. Mild cardiomegaly appears stable to slightly increased since 2016. No pericardial effusion. Negative visible aorta aside from the atherosclerosis. Mediastinum/Nodes: No mediastinal mass or lymphadenopathy is evident. Lungs/Pleura: Large mostly layering but also partially loculated (series 2, image 62) right pleural effusion. Simple fluid density (13 Hounsfield units). There is mild pleural enhancement along the loculated component, which is contiguous with a broad-based right anterior chest wall mass on series 2, image 68. The ventral portion of the chest wall masses approximately 32 x 87 x 33 millimeters (AP by transverse by CC), although some of the margins are indistinct. The lower right pectoralis muscle appears infiltrated. Superimposed compressive right lung atelectasis, severe right lower lobe and middle lobe compression. The major airways are patent aside from some atelectatic changes. In the left lung there is  a stable 6 millimeter superior segment lower lobe nodule since 2016, benign. However, there are multiple new indistinct nodular densities along the ventral left upper lobe on series 4, images  46-55. And in the lower lobe along the costophrenic surface series 4, image 95. Additional more vague nodular and irregular peripheral pulmonary opacities also in the lateral upper lobe, left lower lobe (image 57), and lingula (image 80). No left pleural effusion. Upper Abdomen: Possible subtle 14 millimeter hypodense anterior liver lesion on series 2, image 106, and at the liver dome on image 97. Negative visible gallbladder, spleen, pancreas, adrenal glands, kidneys. Air-fluid level in the visible stomach. Musculoskeletal: Sclerotic and irregular appearance of the anterior right 4th rib in proximity to the chest wall mass is new since 2016. There is similar new abnormal sclerosis in the right half of the sternum on image 66 and image 76. No thoracic vertebral metastasis is identified. IMPRESSION: 1. Large right chest wall mass infiltrating through the ribs into the anterior right pleural space with large partially loculated, mostly layering right pleural effusion. Suspect breast cancer recurrence in this clinical setting. 2. Associated involvement of the right sternum and anterior right 4th rib which have become sclerotic and irregular since 2016. 3. Possible 14 mm liver metastases at the dome and anteriorly. 4. Substantial compressive atelectasis of the right lung. Multifocal indeterminate peripheral and nodular left lung opacities could be metastatic disease or infection. Electronically Signed   By: Genevie Ann M.D.   On: 03/13/2018 15:05   Ir Guided Niel Hummer W Catheter Placement  Result Date: 03/16/2018 CLINICAL DATA:  Metastatic breast carcinoma. Recurrent malignant right pleural effusion. Tunneled pleural drain catheter placement requested. EXAM: INSERTION OF TUNNELED PLEURAL DRAINAGE CATHETER ANESTHESIA/SEDATION: Intravenous Fentanyl and Versed were administered as conscious sedation during continuous monitoring of the patient's level of consciousness and physiological / cardiorespiratory status by the  radiology RN, with a total moderate sedation time of 17 minutes. MEDICATIONS: Cefazolin 2 g. Antibiotic was administered in an appropriate time interval for the procedure. FLUOROSCOPY TIME:  6 seconds; 2 mGy PROCEDURE: The procedure, risks, benefits, and alternatives were explained to the patient. Questions regarding the procedure were encouraged and answered. The patient understands and consents to the procedure. Presence of an adequate right pleural effusion was confirmed with ultrasound. An appropriate skin entry site was determined and marked. The right chest wall was prepped with Betadine in a sterile fashion, and a sterile drape was applied covering the operative field. A sterile gown and sterile gloves were used for the procedure. Local anesthesia was provided with 1% Lidocaine. Ultrasound image documentation was performed. Fluoroscopy during the procedure and fluoroscopic spot radiograph confirms appropriate catheter position. After creating a small skin incision, a 19 gauge needle was advanced into the pleural cavity under ultrasound guidance. A guide wire was then advanced under fluoroscopy into the pleural space. Pleural access was dilated serially and a 16-French peel-away sheath placed. A 16 French tunneled PleurX catheter was placed. This was tunneled from an incision 5 cm below the pleural access to the access site. The catheter was advanced through the peel-away sheath. The sheath was then removed. Final catheter positioning was confirmed with a fluoroscopic spot image. The access incision was closed with Dermabond applied to the incision. A Prolene retention suture was applied at the catheter exit site. Large volume thoracentesis was performed through the new catheter utilizing gravity drainage bag. COMPLICATIONS: None. FINDINGS: The catheter was placed via the right lateral chest wall. Catheter course is towards  the apex. Approximately 0.5 liters of pleural fluid was able to be removed after  catheter placement. The patient will be brought back in 10 to 14days to remove the temporary exit site retention suture after appropriate in-growth around the subcutaneous cuff of the catheter. IMPRESSION: Placement of permanent, tunneled right pleural drainage catheter via lateral approach. 0.5 liters of pleural fluid was removed today after catheter placement. Electronically Signed   By: Lucrezia Europe M.D.   On: 03/16/2018 10:55   Dg Chest Port 1 View  Result Date: 03/15/2018 CLINICAL DATA:  Pleural effusion EXAM: PORTABLE CHEST 1 VIEW COMPARISON:  03/14/2017 FINDINGS: Progression of extensive right lower lobe airspace disease. Mild progression of right pleural effusion. Left lung is clear without infiltrate or effusion. Negative for heart failure or edema. IMPRESSION: Progressive right lower lobe airspace disease and right pleural effusion. Electronically Signed   By: Franchot Gallo M.D.   On: 03/15/2018 14:48   US Thoracentesis Asp Pleural Space W/img Guide  Result Date: 03/14/2018 INDICATION: History of breast cancer. Recent upper respiratory infection. Persistent cough. Right pleural effusion. Request for diagnostic and therapeutic thoracentesis. EXAM: ULTRASOUND GUIDED RIGHT THORACENTESIS MEDICATIONS: 1% Lidocaine = 10 mL COMPLICATIONS: None immediate. PROCEDURE: An ultrasound guided thoracentesis was thoroughly discussed with the patient and questions answered. The benefits, risks, alternatives and complications were also discussed. The patient understands and wishes to proceed with the procedure. Written consent was obtained. Ultrasound was performed to localize and mark an adequate pocket of fluid in the right chest. The area was then prepped and draped in the normal sterile fashion. 1% Lidocaine was used for local anesthesia. Under ultrasound guidance a 6 Fr Safe-T-Centesis catheter was introduced. Thoracentesis was performed. The catheter was removed and a dressing applied. FINDINGS: A total of  approximately 1.28 liters of hazy gold fluid was removed. Samples were sent to the laboratory as requested by the clinical team. IMPRESSION: Successful ultrasound guided right thoracentesis yielding 1.28 liters of pleural fluid. No pneumothorax on post procedure chest X-ray. Read by: Gareth Eagle, PA-C Electronically Signed   By: Corrie Mckusick D.O.   On: 03/14/2018 10:46      Subjective: No dyspnea today.  Discharge Exam: Vitals:   03/16/18 1035 03/16/18 1040  BP: (!) 144/62 (!) 145/67  Pulse: 78 76  Resp: 15 18  Temp:    SpO2: 96% 96%   Vitals:   03/16/18 1025 03/16/18 1030 03/16/18 1035 03/16/18 1040  BP: 129/77 138/70 (!) 144/62 (!) 145/67  Pulse: 77 78 78 76  Resp: 17 19 15 18   Temp:      TempSrc:      SpO2: 96% 95% 96% 96%  Weight:      Height:        General: Pt is alert, awake, not in acute distress Cardiovascular: RRR, S1/S2 +, no rubs, no gallops. 2/6 systolic murmur Respiratory: Diminished breath sounds. No wheezing or rhonchi. Abdominal: Soft, NT, ND, bowel sounds + Extremities: no edema, no cyanosis    The results of significant diagnostics from this hospitalization (including imaging, microbiology, ancillary and laboratory) are listed below for reference.     Microbiology: Recent Results (from the past 240 hour(s))  Culture, body fluid-bottle     Status: None (Preliminary result)   Collection Time: 03/14/18  9:57 AM  Result Value Ref Range Status   Specimen Description PLEURAL RIGHT  Final   Special Requests NONE  Final   Culture   Final    NO GROWTH 1 DAY Performed  at [facility], [address] [place] [address]    Report Status PENDING  Incomplete  Gram stain     Status: None   Collection Time: 03/14/18  9:57 AM  Result Value Ref Range Status   Specimen Description PLEURAL RIGHT  Final   Special Requests NONE  Final   Gram Stain   Final    RARE WBC PRESENT, PREDOMINANTLY MONONUCLEAR NO ORGANISMS SEEN Performed at [facility], [address], [place], [place] [address]    Report Status 03/14/2018 FINAL  Final     Labs: BNP (last 3 results) Recent Labs    03/13/18 1319  BNP 27.7   Basic Metabolic Panel: Recent Labs  Lab 03/13/18 1319 03/14/18 0328  NA 137 142  K 4.6 4.2  CL 103 107  CO2 23 26  GLUCOSE 371* 97  BUN 24* 18  CREATININE 1.16* 0.93  CALCIUM 9.3 9.5   Liver Function Tests: Recent Labs  Lab 03/13/18 1319  AST 29  ALT 14  ALKPHOS 87  BILITOT 0.6  PROT 7.9  ALBUMIN 3.6   No results for input(s): LIPASE, AMYLASE in the last 168 hours. No results for input(s): AMMONIA in the last 168 hours. CBC: Recent Labs  Lab 03/13/18 1319 03/14/18 0328  WBC 5.1 4.4  NEUTROABS 3.8  --   HGB 11.1* 10.2*  HCT 33.7* 32.0*  MCV 84.3 86.7  PLT 391 385   Cardiac Enzymes: Recent Labs  Lab 03/13/18 1319  TROPONINI <0.03   BNP: Invalid input(s): POCBNP CBG: Recent Labs  Lab 03/14/18 2123 03/15/18 0908 03/15/18 1132 03/15/18 1732 03/16/18 0744  GLUCAP 179* 156* 192* 292* 173*   D-Dimer No results for input(s): DDIMER in the last 72 hours. Hgb A1c No results for input(s): HGBA1C in the last 72 hours. Lipid Profile No results for input(s): CHOL, HDL, LDLCALC, TRIG, CHOLHDL, LDLDIRECT in the last 72 hours. Thyroid function studies No results for input(s): TSH, T4TOTAL, T3FREE, THYROIDAB in the last 72 hours.  Invalid input(s): FREET3 Anemia work up No results for input(s): VITAMINB12, FOLATE, FERRITIN, TIBC, IRON, RETICCTPCT in the last 72 hours. Urinalysis No results found for: COLORURINE, APPEARANCEUR, [place], [place], [place], [place], [place], [place], PROTEINUR, UROBILINOGEN, NITRITE, LEUKOCYTESUR Sepsis Labs Invalid input(s): PROCALCITONIN,  WBC,  LACTICIDVEN Microbiology Recent Results (from the past 240 hour(s))  Culture, body fluid-bottle     Status: None (Preliminary result)   Collection Time: 03/14/18  9:57 AM  Result Value Ref Range Status    Specimen Description PLEURAL RIGHT  Final   Special Requests NONE  Final   Culture   Final    NO GROWTH 1 DAY Performed at [facility], [address] [place], [place] [address]    Report Status PENDING  Incomplete  Gram stain     Status: None   Collection Time: 03/14/18  9:57 AM  Result Value Ref Range Status   Specimen Description PLEURAL RIGHT  Final   Special Requests NONE  Final   Gram Stain   Final    RARE WBC PRESENT, PREDOMINANTLY MONONUCLEAR NO ORGANISMS SEEN Performed at [facility], [address], [place], [place] [address]    Report Status 03/14/2018 FINAL  Final    SIGNED:   Cordelia Poche, MD [facility] 03/16/2018, 12:57 PM

## 2018-03-16 NOTE — Progress Notes (Signed)
This CM gave daughter order forms for additional pleurx drainage kits as they are ready for discharge and I have not heard back from Dr. Geralyn Flash office. Daughter was instructed to take forms to next MD appointment to be completed by MD. Marney Doctor [phone]

## 2018-03-16 NOTE — Discharge Instructions (Addendum)
Brianna Valdez, You were admitted because of difficulty breathing and found to have a significant amount of fluid in your right lung. This was taken out and there is labwork pending. Unfortunately, your fluid accumulated quickly and we decided to place a catheter in your lung as this would allow more frequent drainage.  Please drain the catheter once daily. Follow-up with hospice at home and Dr. Lindi Adie as an outpatient.    Chest Tube Insertion, Adult A chest tube is a thin, flexible tube that is inserted into the space between your lung and your chest wall. You may need a chest tube if you have a collapsed lung from illness or a severe injury. A collapsed lung can be caused by:  An air leak (pneumothorax).  Blood collection (hemothorax).  Fluid buildup from an infection (empyema).  The chest tube drains the fluid or air from your lung. It may be attached to a suction device to help with drainage. You will need to stay in the hospital while the chest tube is in place. Tell a health care provider about:  Any allergies you have.  All medicines you are taking, including vitamins, herbs, eye drops, creams, and over-the-counter medicines.  Any problems you or family members have had with anesthetic medicines.  Any blood disorders you have.  Any surgeries you have had.  Any medical conditions you have, including any recent fever or cold symptoms.  Whether you are pregnant or may be pregnant. What are the risks? Generally, this is a safe procedure. However, problems may occur, including:  Bleeding.  Infection.  Allergic reaction to medicines.  Lung damage.  Damage to the blood vessels or nerves near the lung.  Failure of the chest tube to work properly.  What happens before the procedure? Staying hydrated Follow instructions from your health care provider about hydration, which may include:  Up to 2 hours before the procedure - you may continue to drink clear liquids,  such as water, clear fruit juice, black coffee, and plain tea.  Eating and drinking restrictions Follow instructions from your health care provider about eating and drinking, which may include:  8 hours before the procedure - stop eating heavy meals or foods such as meat, fried foods, or fatty foods.  6 hours before the procedure - stop eating light meals or foods, such as toast or cereal.  6 hours before the procedure - stop drinking milk or drinks that contain milk.  2 hours before the procedure - stop drinking clear liquids.  Medicines  Ask your health care provider about: ? Changing or stopping your regular medicines. This is especially important if you are taking diabetes medicines or blood thinners. ? Taking medicines such as aspirin and ibuprofen. These medicines can thin your blood. Do not take these medicines before your procedure if your health care provider instructs you not to. General instructions  You will have a chest X-ray or other imaging studies of the lung.  Plan to have someone take you home from the hospital or clinic.  If you will be going home right after the procedure, plan to have someone with you for 24 hours. What happens during the procedure?  To reduce your risk of infection: ? Your health care team will wash or sanitize their hands. ? Your skin will be washed with soap. ? Hair may be removed from the surgical area.  An IV tube will be inserted into one of your veins.  You will be given one or more of  the following: ? A medicine to help you relax (sedative). ? A medicine to numb the area (local anesthetic).  You may be given antibiotic and pain medicines through the IV tube.  The surgeon will make a small incision in a space between your ribs.  The chest tube will be placed through the incision into the space between the lung and your chest wall.  Stitches (sutures) will be used to close the incision around the tube.  The chest tube may be  attached to a suction device.  The incision site will be covered with an airtight bandage (dressing).  Another chest X-ray will be done to check the position of the tube. The procedure may vary among health care providers and hospitals. What happens after the procedure?  Your blood pressure, heart rate, breathing rate, and blood oxygen level will be monitored until the medicines you were given have worn off.  You may continue to get pain medicine or antibiotics through the IV tube.  The tube and dressing will be checked regularly.  You will be encouraged to cough and take deep breaths.  You may be given oxygen to breathe.  Chest X-rays will be done to find out if the lung is inflating. After the lung is inflated: ? Another chest X-ray may be done. ? The chest tube can be removed after the lung remains inflated and you are breathing easily. ? A new dressing will be put on.  Do not drive for 24 hours if you were given a sedative. This information is not intended to replace advice given to you by your health care provider. Make sure you discuss any questions you have with your health care provider. Document Released: 12/29/2006 Document Revised: 04/09/2016 Document Reviewed: 03/03/2016 Elsevier Interactive Patient Education  2018 [employer].  Moderate Conscious Sedation, Adult, Care After These instructions provide you with information about caring for yourself after your procedure. Your health care provider may also give you more specific instructions. Your treatment has been planned according to current medical practices, but problems sometimes occur. Call your health care provider if you have any problems or questions after your procedure. What can I expect after the procedure? After your procedure, it is common: To feel sleepy for several hours. To feel clumsy and have poor balance for several hours. To have poor judgment for several hours. To vomit if you eat too  soon.  Follow these instructions at home: For at least 24 hours after the procedure:  Do not: Participate in activities where you could fall or become injured. Drive. Use heavy machinery. Drink alcohol. Take sleeping pills or medicines that cause drowsiness. Make important decisions or sign legal documents. Take care of children on your own. Rest. Eating and drinking Follow the diet recommended by your health care provider. If you vomit: Drink water, juice, or soup when you can drink without vomiting. Make sure you have little or no nausea before eating solid foods. General instructions Have a responsible adult stay with you until you are awake and alert. Take over-the-counter and prescription medicines only as told by your health care provider. If you smoke, do not smoke without supervision. Keep all follow-up visits as told by your health care provider. This is important. Contact a health care provider if: You keep feeling nauseous or you keep vomiting. You feel light-headed. You develop a rash. You have a fever. Get help right away if: You have trouble breathing. This information is not intended to replace advice given to  you by your health care provider. Make sure you discuss any questions you have with your health care provider. Document Released: 07/11/2013 Document Revised: 02/23/2016 Document Reviewed: 01/10/2016 Elsevier Interactive Patient Education  [employer].

## 2018-03-16 NOTE — Care Management Important Message (Signed)
Important Message  Patient Details  Name: Ruthellen Tippy MRN: [id number] Date of Birth: Sep 08, 1931   Medicare Important Message Given:  Yes    Kerin Salen 03/16/2018, 11:53 AMImportant Message  Patient Details  Name: Brittley Regner MRN: [id number] Date of Birth: 12-27-1930   Medicare Important Message Given:  Yes    Kerin Salen 03/16/2018, 11:53 AM

## 2018-03-16 NOTE — Care Management Note (Signed)
Case Management Note  Patient Details  Name: Brianna Valdez MRN: [id number] Date of Birth: [birth year]  Subjective/Objective: 84 yo admitted with Pleural Effusion. Hx of breast cancer                   Action/Plan: From home with spouse. CM consult placed for home hospice choice. This CM met with pt and family at bedside to offer choice. [facility] chosen and referral called into referral center. Pt to dc home with a new Pleurx catheter. Nurse secretary has ordered drainage kits to go home with pt and this CM has left a message with Dr. Geralyn Flash nursing office about form for additional kits.  Expected Discharge Date:  (unknown)               Expected Discharge Plan:  Home w Hospice Care  In-House Referral:     Discharge planning Services  CM Consult  Post Acute Care Choice:  Hospice Choice offered to:  Adult Children  DME Arranged:    DME Agency:     HH Arranged:  Disease Management [place] Agency:  [place]  Status of Service:  In process, will continue to follow  If discussed at Long Length of Stay Meetings, dates discussed:    Additional Comments:  Lynnell Catalan, RN 03/16/2018, 11:47 AM

## 2018-03-16 NOTE — Consult Note (Signed)
[facility]:  Met with the pt and family husband and two daughters. They are in agreement with hospice care and we will follow them at home once discharged. Plan to see pt I home tomorrow at 1130 there request. Please call with any questions. Jannette Fogo RN [phone]

## 2018-03-16 NOTE — Telephone Encounter (Signed)
Hospice RN, Shella Spearing, called to say that pt request Dr Lindi Adie to be her attending physician while in Hospice care.  Dr Lindi Adie is agrreable.  Dianna made aware.

## 2018-03-16 NOTE — Progress Notes (Signed)
Chief Complaint: Patient was seen in consultation today for pleurX catheter at the request of Dr. Gardenia Phlegm  Referring Physician(s): Dr. Gardenia Phlegm  Supervising Physician: Arne Cleveland  Patient Status: [facility] - In-pt  History of Present Illness: Brianna Valdez is a 84 y.o. female with breast cancer and recurrent right pleural effusion, likely malignant. Had large volume thoracentesis a couple days ago and effusion has recurred. Pt to be discharged with anticipated Hospice care. IR is requested to place (R)Pleurx catheter. Chart, meds, labs, imaging reviewed. Has been NPO this am.  Past Medical History:  Diagnosis Date  . Anemia   . Arthritis   . Breast cancer of upper-inner quadrant of left female breast ([place]) 11/06/2014  . Diabetes mellitus    type 2   . Diabetic neuropathy (HCC)    slight in her feet  . GERD (gastroesophageal reflux disease)   . Glaucoma   . Hypertension   . Thyroid disease     Past Surgical History:  Procedure Laterality Date  . ABDOMINAL HYSTERECTOMY    . BREAST LUMPECTOMY Right   . BREAST LUMPECTOMY WITH SENTINEL LYMPH NODE BIOPSY Right 04/08/2015  . BREAST LUMPECTOMY WITH SENTINEL LYMPH NODE BIOPSY Right 04/08/2015   Procedure: RIGHT BREAST LUMPECTOMY WITH RIGHT AXILLARY SENTINEL LYMPH NODE BIOPSY;  Surgeon: Excell Seltzer, MD;  Location: [place];  Service: General;  Laterality: Right;  . COLONOSCOPY    . EYE SURGERY Bilateral    cataract surgery with lens implant  . PORT-A-CATH REMOVAL Left 04/08/2015   Procedure: REMOVAL PORT-A-CATH LEFT CHEST;  Surgeon: Excell Seltzer, MD;  Location: [place];  Service: General;  Laterality: Left;  . PORTACATH PLACEMENT Left 11/26/2014   Procedure: INSERTION PORT-A-CATH;  Surgeon: Excell Seltzer, MD;  Location: [place];  Service: General;  Laterality: Left;  . PORTACATH PLACEMENT Left 11/26/2014   Procedure: REPOSITION OF A PORT-A-CATH;  Surgeon: Excell Seltzer, MD;  Location: [place];  Service:  General;  Laterality: Left;  . THYROIDECTOMY      Allergies: Patient has no known allergies.  Medications:  Current Facility-Administered Medications:  .  acetaminophen (TYLENOL) tablet 650 mg, 650 mg, Oral, Q6H PRN, 650 mg at 03/16/18 0630 **OR** acetaminophen (TYLENOL) suppository 650 mg, 650 mg, Rectal, Q6H PRN, Hugelmeyer, Alexis, DO .  acidophilus (RISAQUAD) capsule, , Oral, Q supper, Hugelmeyer, Alexis, DO, 1 capsule at 03/15/18 1740 .  bisacodyl (DULCOLAX) EC tablet 5 mg, 5 mg, Oral, Daily PRN, Hugelmeyer, Alexis, DO .  brimonidine (ALPHAGAN) 0.15 % ophthalmic solution 1 drop, 1 drop, Both Eyes, BID, Hugelmeyer, Alexis, DO, 1 drop at 03/16/18 0819 .  enoxaparin (LOVENOX) injection 40 mg, 40 mg, Subcutaneous, Q24H, Shoji Pertuit, PA-C .  insulin aspart (novoLOG) injection 0-9 Units, 0-9 Units, Subcutaneous, Q4H, Hugelmeyer, Alexis, DO, 2 Units at 03/16/18 0815 .  ipratropium-albuterol (DUONEB) 0.5-2.5 (3) MG/3ML nebulizer solution 3 mL, 3 mL, Nebulization, Q6H PRN, Hugelmeyer, Alexis, DO, 3 mL at 03/14/18 0736 .  ipratropium-albuterol (DUONEB) 0.5-2.5 (3) MG/3ML nebulizer solution 3 mL, 3 mL, Nebulization, BID, Hugelmeyer, Alexis, DO, 3 mL at 03/16/18 0806 .  levothyroxine (SYNTHROID, LEVOTHROID) tablet 50 mcg, 50 mcg, Oral, QAC breakfast, Hugelmeyer, Alexis, DO, 50 mcg at 03/16/18 0814 .  lisinopril (PRINIVIL,ZESTRIL) tablet 20 mg, 20 mg, Oral, Daily, Hugelmeyer, Alexis, DO, 20 mg at 03/15/18 0942 .  magnesium citrate solution 1 Bottle, 1 Bottle, Oral, Once PRN, Hugelmeyer, Alexis, DO .  magnesium oxide (MAG-OX) tablet 200 mg, 200 mg, Oral, QAC supper, Hugelmeyer, Alexis, DO, 200 mg  at 03/15/18 1740 .  multivitamin (PROSIGHT) tablet 1 tablet, 1 tablet, Oral, Daily, Hugelmeyer, Alexis, DO, 1 tablet at 03/15/18 0942 .  multivitamin with minerals tablet 1 tablet, 1 tablet, Oral, Daily, Hugelmeyer, Alexis, DO, 1 tablet at 03/15/18 0943 .  ondansetron (ZOFRAN) tablet 4 mg, 4 mg, Oral, Q6H  PRN **OR** ondansetron (ZOFRAN) injection 4 mg, 4 mg, Intravenous, Q6H PRN, Hugelmeyer, Alexis, DO .  oxyCODONE (Oxy IR/ROXICODONE) immediate release tablet 5 mg, 5 mg, Oral, Q4H PRN, Hugelmeyer, Alexis, DO, 5 mg at 03/14/18 1229 .  senna-docusate (Senokot-S) tablet 1 tablet, 1 tablet, Oral, QHS PRN, Hugelmeyer, Alexis, DO    No family history on file.  Social History   Socioeconomic History  . Marital status: Married    Spouse name: Not on file  . Number of children: Not on file  . Years of education: Not on file  . Highest education level: Not on file  Occupational History  . Not on file  Social Needs  . Financial resource strain: Not on file  . Food insecurity:    Worry: Not on file    Inability: Not on file  . Transportation needs:    Medical: Not on file    Non-medical: Not on file  Tobacco Use  . Smoking status: Never Smoker  . Smokeless tobacco: Never Used  Substance and Sexual Activity  . Alcohol use: No    Alcohol/week: 0.0 oz  . Drug use: No  . Sexual activity: Not on file  Lifestyle  . Physical activity:    Days per week: Not on file    Minutes per session: Not on file  . Stress: Not on file  Relationships  . Social connections:    Talks on phone: Not on file    Gets together: Not on file    Attends religious service: Not on file    Active member of club or organization: Not on file    Attends meetings of clubs or organizations: Not on file    Relationship status: Not on file  Other Topics Concern  . Not on file  Social History Narrative  . Not on file     Review of Systems: A 12 point ROS discussed and pertinent positives are indicated in the HPI above.  All other systems are negative.  Review of Systems  Vital Signs: BP (!) 144/65 (BP Location: Left Arm)   Pulse 82   Temp 98.4 F (36.9 C) (Oral)   Resp 16   Ht 5\' 1"  (1.549 m)   Wt 145 lb 9.6 oz (66 kg)   SpO2 96%   BMI 27.51 kg/m   Physical Exam  Constitutional: She is oriented to  person, place, and time. She appears well-developed. No distress.  HENT:  Head: Normocephalic.  Mouth/Throat: Oropharynx is clear and moist.  Neck: Normal range of motion. No JVD present. No tracheal deviation present.  Cardiovascular: Normal rate, regular rhythm and normal heart sounds.  Pulmonary/Chest: Effort normal and breath sounds normal.  Neurological: She is alert and oriented to person, place, and time.  Psychiatric: She has a normal mood and affect. Judgment normal.    Imaging: Dg Chest 1 View  Result Date: 03/14/2018 CLINICAL DATA:  Status post right thoracentesis EXAM: CHEST  1 VIEW COMPARISON:  03/13/2018 FINDINGS: Cardiac shadow is stable. Aortic calcifications are again seen. The left lung is well aerated without focal infiltrate. The previously seen right-sided effusion has decreased significantly following thoracentesis. No pneumothorax is noted. Persistent lower lobe  collapse is noted. IMPRESSION: No evidence of pneumothorax following thoracentesis. Electronically Signed   By: Inez Catalina M.D.   On: 03/14/2018 10:28   Dg Chest 2 View  Result Date: 03/13/2018 CLINICAL DATA:  COPD.  Cough. EXAM: CHEST - 2 VIEW COMPARISON:  11/18/2016 FINDINGS: There is a moderate right pleural effusion. There is right lower lobe airspace disease which may reflect atelectasis versus pneumonia. There is bilateral interstitial prominence likely secondary to low lung volumes. There is no pneumothorax. There is no left pleural effusion. The heart and mediastinal contours are unremarkable. There is thoracic aortic atherosclerosis. There is no acute osseous abnormality. IMPRESSION: 1. Moderate right pleural effusion. Right lower lobe airspace disease which may reflect atelectasis versus pneumonia. Electronically Signed   By: Kathreen Devoid   On: 03/13/2018 13:06   Ct Chest W Contrast  Result Date: 03/13/2018 CLINICAL DATA:  84 year old female with 1 week of productive cough. Right pleural effusion  identified on chest radiographs today. Personal history of right breast cancer, 2016. EXAM: CT CHEST WITH CONTRAST TECHNIQUE: Multidetector CT imaging of the chest was performed during intravenous contrast administration. CONTRAST:  157mL ISOVUE-300 IOPAMIDOL (ISOVUE-300) INJECTION 61% COMPARISON:  Chest radiographs 1247 hours today, and earlier. PET-CT 11/29/2014. FINDINGS: Cardiovascular: Calcified aortic atherosclerosis. Calcified coronary artery atherosclerosis and/or stents. Mild cardiomegaly appears stable to slightly increased since 2016. No pericardial effusion. Negative visible aorta aside from the atherosclerosis. Mediastinum/Nodes: No mediastinal mass or lymphadenopathy is evident. Lungs/Pleura: Large mostly layering but also partially loculated (series 2, image 62) right pleural effusion. Simple fluid density (13 Hounsfield units). There is mild pleural enhancement along the loculated component, which is contiguous with a broad-based right anterior chest wall mass on series 2, image 68. The ventral portion of the chest wall masses approximately 32 x 87 x 33 millimeters (AP by transverse by CC), although some of the margins are indistinct. The lower right pectoralis muscle appears infiltrated. Superimposed compressive right lung atelectasis, severe right lower lobe and middle lobe compression. The major airways are patent aside from some atelectatic changes. In the left lung there is a stable 6 millimeter superior segment lower lobe nodule since 2016, benign. However, there are multiple new indistinct nodular densities along the ventral left upper lobe on series 4, images 46-55. And in the lower lobe along the costophrenic surface series 4, image 95. Additional more vague nodular and irregular peripheral pulmonary opacities also in the lateral upper lobe, left lower lobe (image 57), and lingula (image 80). No left pleural effusion. Upper Abdomen: Possible subtle 14 millimeter hypodense anterior liver  lesion on series 2, image 106, and at the liver dome on image 97. Negative visible gallbladder, spleen, pancreas, adrenal glands, kidneys. Air-fluid level in the visible stomach. Musculoskeletal: Sclerotic and irregular appearance of the anterior right 4th rib in proximity to the chest wall mass is new since 2016. There is similar new abnormal sclerosis in the right half of the sternum on image 66 and image 76. No thoracic vertebral metastasis is identified. IMPRESSION: 1. Large right chest wall mass infiltrating through the ribs into the anterior right pleural space with large partially loculated, mostly layering right pleural effusion. Suspect breast cancer recurrence in this clinical setting. 2. Associated involvement of the right sternum and anterior right 4th rib which have become sclerotic and irregular since 2016. 3. Possible 14 mm liver metastases at the dome and anteriorly. 4. Substantial compressive atelectasis of the right lung. Multifocal indeterminate peripheral and nodular left lung opacities could be metastatic  disease or infection. Electronically Signed   By: Genevie Ann M.D.   On: 03/13/2018 15:05   Dg Chest Port 1 View  Result Date: 03/15/2018 CLINICAL DATA:  Pleural effusion EXAM: PORTABLE CHEST 1 VIEW COMPARISON:  03/14/2017 FINDINGS: Progression of extensive right lower lobe airspace disease. Mild progression of right pleural effusion. Left lung is clear without infiltrate or effusion. Negative for heart failure or edema. IMPRESSION: Progressive right lower lobe airspace disease and right pleural effusion. Electronically Signed   By: Franchot Gallo M.D.   On: 03/15/2018 14:48   US Thoracentesis Asp Pleural Space W/img Guide  Result Date: 03/14/2018 INDICATION: History of breast cancer. Recent upper respiratory infection. Persistent cough. Right pleural effusion. Request for diagnostic and therapeutic thoracentesis. EXAM: ULTRASOUND GUIDED RIGHT THORACENTESIS MEDICATIONS: 1% Lidocaine = 10 mL  COMPLICATIONS: None immediate. PROCEDURE: An ultrasound guided thoracentesis was thoroughly discussed with the patient and questions answered. The benefits, risks, alternatives and complications were also discussed. The patient understands and wishes to proceed with the procedure. Written consent was obtained. Ultrasound was performed to localize and mark an adequate pocket of fluid in the right chest. The area was then prepped and draped in the normal sterile fashion. 1% Lidocaine was used for local anesthesia. Under ultrasound guidance a 6 Fr Safe-T-Centesis catheter was introduced. Thoracentesis was performed. The catheter was removed and a dressing applied. FINDINGS: A total of approximately 1.28 liters of hazy gold fluid was removed. Samples were sent to the laboratory as requested by the clinical team. IMPRESSION: Successful ultrasound guided right thoracentesis yielding 1.28 liters of pleural fluid. No pneumothorax on post procedure chest X-ray. Read by: Gareth Eagle, PA-C Electronically Signed   By: Corrie Mckusick D.O.   On: 03/14/2018 10:46    Labs:  CBC: Recent Labs    03/13/18 1319 03/14/18 0328  WBC 5.1 4.4  HGB 11.1* 10.2*  HCT 33.7* 32.0*  PLT 391 385    COAGS: Recent Labs    03/14/18 0328  INR 1.11    BMP: Recent Labs    03/13/18 1319 03/14/18 0328  NA 137 142  K 4.6 4.2  CL 103 107  CO2 23 26  GLUCOSE 371* 97  BUN 24* 18  CALCIUM 9.3 9.5  CREATININE 1.16* 0.93  GFRNONAA 42* 54*  GFRAA 48* >60    LIVER FUNCTION TESTS: Recent Labs    03/13/18 1319  BILITOT 0.6  AST 29  ALT 14  ALKPHOS 87  PROT 7.9  ALBUMIN 3.6    TUMOR MARKERS: No results for input(s): AFPTM, CEA, CA199, CHROMGRNA in the last 8760 hours.  Assessment and Plan: Breast cancer Recurrent right pleural effusion, likely malignant, cytology pending Labs ok Risks and benefits discussed with the patient including bleeding, infection, damage to adjacent structures, bowel perforation/fistula  connection, and sepsis.  All of the patient's questions were answered, patient is agreeable to proceed. Consent signed and in chart.    Thank you for this interesting consult.  I greatly enjoyed meeting Brianna Valdez and look forward to participating in their care.  A copy of this report was sent to the requesting provider on this date.  Electronically Signed: Ascencion Dike, PA-C 03/16/2018, 9:17 AM   I spent a total of 20 minutes in face to face in clinical consultation, greater than 50% of which was counseling/coordinating care for (R)pleurX catheter placement

## 2018-03-19 LAB — CULTURE, BODY FLUID W GRAM STAIN -BOTTLE: Culture: NO GROWTH

## 2018-03-19 LAB — CULTURE, BODY FLUID-BOTTLE

## 2018-03-29 ENCOUNTER — Other Ambulatory Visit: Payer: Self-pay

## 2018-03-29 DIAGNOSIS — C50211 Malignant neoplasm of upper-inner quadrant of right female breast: Secondary | ICD-10-CM

## 2018-03-29 DIAGNOSIS — Z171 Estrogen receptor negative status [ER-]: Secondary | ICD-10-CM

## 2018-03-30 ENCOUNTER — Telehealth: Payer: Self-pay | Admitting: Hematology and Oncology

## 2018-03-30 ENCOUNTER — Inpatient Hospital Stay: Payer: Medicare Other

## 2018-03-30 ENCOUNTER — Inpatient Hospital Stay: Payer: Medicare Other | Admitting: Hematology and Oncology

## 2018-03-30 NOTE — Telephone Encounter (Signed)
Discussed how hospice care is going on. She appears to be doing reasonably well. She has pain issues which are being managed by hospice. I also called and discussed with her daughter.

## 2018-04-18 ENCOUNTER — Ambulatory Visit: Payer: Medicare Other | Admitting: Hematology and Oncology

## 2018-04-20 ENCOUNTER — Ambulatory Visit: Payer: Medicare Other | Admitting: Hematology and Oncology

## 2018-06-04 DEATH — deceased
